# Patient Record
Sex: Female | Born: 1935 | Race: White | Hispanic: No | Marital: Married | Smoking: Never smoker
Health system: Southern US, Community
[De-identification: ages and names within clinical notes are randomized; demographics above are authoritative.]

## PROBLEM LIST (undated history)

## (undated) DIAGNOSIS — E785 Hyperlipidemia, unspecified: Secondary | ICD-10-CM

## (undated) DIAGNOSIS — Z972 Presence of dental prosthetic device (complete) (partial): Secondary | ICD-10-CM

## (undated) DIAGNOSIS — I2510 Atherosclerotic heart disease of native coronary artery without angina pectoris: Secondary | ICD-10-CM

## (undated) DIAGNOSIS — K219 Gastro-esophageal reflux disease without esophagitis: Secondary | ICD-10-CM

## (undated) DIAGNOSIS — I209 Angina pectoris, unspecified: Secondary | ICD-10-CM

## (undated) DIAGNOSIS — I340 Nonrheumatic mitral (valve) insufficiency: Secondary | ICD-10-CM

## (undated) DIAGNOSIS — N1831 Chronic kidney disease, stage 3a: Secondary | ICD-10-CM

## (undated) DIAGNOSIS — I071 Rheumatic tricuspid insufficiency: Secondary | ICD-10-CM

## (undated) DIAGNOSIS — J189 Pneumonia, unspecified organism: Secondary | ICD-10-CM

## (undated) DIAGNOSIS — I700 Atherosclerosis of aorta: Secondary | ICD-10-CM

## (undated) DIAGNOSIS — Z86711 Personal history of pulmonary embolism: Secondary | ICD-10-CM

## (undated) DIAGNOSIS — Z974 Presence of external hearing-aid: Secondary | ICD-10-CM

## (undated) DIAGNOSIS — M1990 Unspecified osteoarthritis, unspecified site: Secondary | ICD-10-CM

## (undated) DIAGNOSIS — I10 Essential (primary) hypertension: Secondary | ICD-10-CM

## (undated) DIAGNOSIS — R011 Cardiac murmur, unspecified: Secondary | ICD-10-CM

## (undated) HISTORY — DX: Unspecified osteoarthritis, unspecified site: M19.90

## (undated) HISTORY — PX: COLONOSCOPY: SHX174

## (undated) HISTORY — PX: MENISCUS REPAIR: SHX5179

## (undated) HISTORY — PX: APPENDECTOMY: SHX54

## (undated) HISTORY — DX: Essential (primary) hypertension: I10

## (undated) HISTORY — PX: ESOPHAGOGASTRODUODENOSCOPY: SHX1529

## (undated) HISTORY — PX: TONSILLECTOMY: SUR1361

## (undated) HISTORY — PX: EYE SURGERY: SHX253

## (undated) HISTORY — DX: Hyperlipidemia, unspecified: E78.5

## (undated) HISTORY — PX: BACK SURGERY: SHX140

## (undated) HISTORY — PX: ABDOMINAL HYSTERECTOMY: SHX81

---

## 1968-10-24 HISTORY — PX: BREAST CYST ASPIRATION: SHX578

## 1971-10-25 HISTORY — PX: BREAST BIOPSY: SHX20

## 1998-07-16 ENCOUNTER — Ambulatory Visit (HOSPITAL_COMMUNITY): Admission: RE | Admit: 1998-07-16 | Discharge: 1998-07-16 | Payer: Self-pay | Admitting: Obstetrics & Gynecology

## 1999-03-26 ENCOUNTER — Encounter: Payer: Self-pay | Admitting: Gastroenterology

## 1999-03-26 ENCOUNTER — Ambulatory Visit (HOSPITAL_COMMUNITY): Admission: RE | Admit: 1999-03-26 | Discharge: 1999-03-26 | Payer: Self-pay | Admitting: Gastroenterology

## 1999-05-28 ENCOUNTER — Ambulatory Visit (HOSPITAL_COMMUNITY): Admission: RE | Admit: 1999-05-28 | Discharge: 1999-05-28 | Payer: Self-pay | Admitting: Gastroenterology

## 2001-06-18 ENCOUNTER — Ambulatory Visit (HOSPITAL_COMMUNITY): Admission: RE | Admit: 2001-06-18 | Discharge: 2001-06-18 | Payer: Self-pay | Admitting: Gastroenterology

## 2003-03-03 ENCOUNTER — Ambulatory Visit (HOSPITAL_COMMUNITY): Admission: RE | Admit: 2003-03-03 | Discharge: 2003-03-03 | Payer: Self-pay | Admitting: Gastroenterology

## 2005-04-04 ENCOUNTER — Ambulatory Visit: Payer: Self-pay | Admitting: Internal Medicine

## 2005-04-21 ENCOUNTER — Ambulatory Visit (HOSPITAL_COMMUNITY): Admission: RE | Admit: 2005-04-21 | Discharge: 2005-04-21 | Payer: Self-pay | Admitting: Gastroenterology

## 2005-11-01 ENCOUNTER — Ambulatory Visit: Payer: Self-pay

## 2005-11-25 ENCOUNTER — Ambulatory Visit: Payer: Self-pay | Admitting: Orthopaedic Surgery

## 2006-05-02 ENCOUNTER — Ambulatory Visit: Payer: Self-pay | Admitting: Internal Medicine

## 2007-02-09 ENCOUNTER — Ambulatory Visit: Payer: Self-pay

## 2007-03-12 ENCOUNTER — Ambulatory Visit: Payer: Self-pay | Admitting: Internal Medicine

## 2007-03-13 ENCOUNTER — Ambulatory Visit: Payer: Self-pay | Admitting: Internal Medicine

## 2007-05-04 ENCOUNTER — Ambulatory Visit: Payer: Self-pay | Admitting: Internal Medicine

## 2007-05-08 ENCOUNTER — Ambulatory Visit: Payer: Self-pay | Admitting: Internal Medicine

## 2007-11-06 ENCOUNTER — Other Ambulatory Visit: Payer: Self-pay

## 2007-11-06 ENCOUNTER — Emergency Department: Payer: Self-pay | Admitting: Internal Medicine

## 2008-05-09 ENCOUNTER — Ambulatory Visit: Payer: Self-pay | Admitting: Internal Medicine

## 2009-01-28 ENCOUNTER — Ambulatory Visit: Payer: Self-pay | Admitting: Internal Medicine

## 2009-05-11 ENCOUNTER — Ambulatory Visit: Payer: Self-pay | Admitting: Internal Medicine

## 2009-06-05 ENCOUNTER — Ambulatory Visit: Payer: Self-pay | Admitting: Otolaryngology

## 2010-04-09 ENCOUNTER — Ambulatory Visit: Payer: Self-pay | Admitting: Internal Medicine

## 2010-05-12 ENCOUNTER — Ambulatory Visit: Payer: Self-pay | Admitting: Internal Medicine

## 2010-10-10 ENCOUNTER — Emergency Department: Payer: Self-pay | Admitting: Emergency Medicine

## 2011-05-11 ENCOUNTER — Other Ambulatory Visit: Payer: Self-pay | Admitting: Gastroenterology

## 2011-05-23 ENCOUNTER — Ambulatory Visit: Payer: Self-pay | Admitting: Internal Medicine

## 2011-07-14 ENCOUNTER — Ambulatory Visit: Payer: Self-pay | Admitting: Ophthalmology

## 2011-07-25 ENCOUNTER — Ambulatory Visit: Payer: Self-pay | Admitting: Ophthalmology

## 2011-08-12 ENCOUNTER — Ambulatory Visit: Payer: Self-pay | Admitting: Ophthalmology

## 2011-08-29 ENCOUNTER — Ambulatory Visit: Payer: Self-pay | Admitting: Ophthalmology

## 2012-05-08 ENCOUNTER — Ambulatory Visit: Payer: Self-pay | Admitting: Internal Medicine

## 2012-05-24 ENCOUNTER — Ambulatory Visit: Payer: Self-pay | Admitting: Internal Medicine

## 2013-06-06 ENCOUNTER — Ambulatory Visit: Payer: Self-pay | Admitting: Internal Medicine

## 2013-07-10 ENCOUNTER — Other Ambulatory Visit: Payer: Self-pay | Admitting: Gastroenterology

## 2014-07-16 ENCOUNTER — Ambulatory Visit: Payer: Self-pay | Admitting: Internal Medicine

## 2015-07-01 ENCOUNTER — Other Ambulatory Visit: Payer: Self-pay | Admitting: Internal Medicine

## 2015-07-01 DIAGNOSIS — Z1231 Encounter for screening mammogram for malignant neoplasm of breast: Secondary | ICD-10-CM

## 2015-07-20 ENCOUNTER — Ambulatory Visit
Admission: RE | Admit: 2015-07-20 | Discharge: 2015-07-20 | Disposition: A | Discharge status: Home / Self Care | Payer: Medicare Other | Source: Ambulatory Visit | Attending: Internal Medicine | Admitting: Internal Medicine

## 2015-07-20 DIAGNOSIS — Z1231 Encounter for screening mammogram for malignant neoplasm of breast: Secondary | ICD-10-CM | POA: Insufficient documentation

## 2015-07-27 ENCOUNTER — Other Ambulatory Visit: Payer: Self-pay | Admitting: Gastroenterology

## 2015-11-09 DIAGNOSIS — G5603 Carpal tunnel syndrome, bilateral upper limbs: Secondary | ICD-10-CM | POA: Diagnosis not present

## 2015-12-30 DIAGNOSIS — E7800 Pure hypercholesterolemia, unspecified: Secondary | ICD-10-CM | POA: Diagnosis not present

## 2015-12-30 DIAGNOSIS — Z79899 Other long term (current) drug therapy: Secondary | ICD-10-CM | POA: Diagnosis not present

## 2016-01-06 DIAGNOSIS — M1990 Unspecified osteoarthritis, unspecified site: Secondary | ICD-10-CM | POA: Diagnosis not present

## 2016-01-06 DIAGNOSIS — I10 Essential (primary) hypertension: Secondary | ICD-10-CM | POA: Diagnosis not present

## 2016-01-06 DIAGNOSIS — Z79899 Other long term (current) drug therapy: Secondary | ICD-10-CM | POA: Diagnosis not present

## 2016-01-06 DIAGNOSIS — E7800 Pure hypercholesterolemia, unspecified: Secondary | ICD-10-CM | POA: Diagnosis not present

## 2016-01-06 DIAGNOSIS — Z23 Encounter for immunization: Secondary | ICD-10-CM | POA: Diagnosis not present

## 2016-07-01 DIAGNOSIS — I10 Essential (primary) hypertension: Secondary | ICD-10-CM | POA: Diagnosis not present

## 2016-07-01 DIAGNOSIS — Z79899 Other long term (current) drug therapy: Secondary | ICD-10-CM | POA: Diagnosis not present

## 2016-07-01 DIAGNOSIS — E7800 Pure hypercholesterolemia, unspecified: Secondary | ICD-10-CM | POA: Diagnosis not present

## 2016-07-08 DIAGNOSIS — Z0000 Encounter for general adult medical examination without abnormal findings: Secondary | ICD-10-CM | POA: Diagnosis not present

## 2016-07-08 DIAGNOSIS — R51 Headache: Secondary | ICD-10-CM | POA: Diagnosis not present

## 2016-07-08 DIAGNOSIS — R0789 Other chest pain: Secondary | ICD-10-CM | POA: Diagnosis not present

## 2016-07-08 DIAGNOSIS — R011 Cardiac murmur, unspecified: Secondary | ICD-10-CM | POA: Diagnosis not present

## 2016-07-08 DIAGNOSIS — I10 Essential (primary) hypertension: Secondary | ICD-10-CM | POA: Diagnosis not present

## 2016-07-08 DIAGNOSIS — E7800 Pure hypercholesterolemia, unspecified: Secondary | ICD-10-CM | POA: Diagnosis not present

## 2016-07-08 DIAGNOSIS — R079 Chest pain, unspecified: Secondary | ICD-10-CM | POA: Diagnosis not present

## 2016-07-08 DIAGNOSIS — F5104 Psychophysiologic insomnia: Secondary | ICD-10-CM | POA: Diagnosis not present

## 2016-07-08 DIAGNOSIS — Z79899 Other long term (current) drug therapy: Secondary | ICD-10-CM | POA: Diagnosis not present

## 2016-07-08 DIAGNOSIS — K219 Gastro-esophageal reflux disease without esophagitis: Secondary | ICD-10-CM | POA: Diagnosis not present

## 2016-07-08 DIAGNOSIS — Z1239 Encounter for other screening for malignant neoplasm of breast: Secondary | ICD-10-CM | POA: Diagnosis not present

## 2016-07-15 DIAGNOSIS — I208 Other forms of angina pectoris: Secondary | ICD-10-CM | POA: Diagnosis not present

## 2016-07-15 DIAGNOSIS — Z23 Encounter for immunization: Secondary | ICD-10-CM | POA: Diagnosis not present

## 2016-07-15 DIAGNOSIS — R0789 Other chest pain: Secondary | ICD-10-CM | POA: Diagnosis not present

## 2016-07-15 DIAGNOSIS — I10 Essential (primary) hypertension: Secondary | ICD-10-CM | POA: Diagnosis not present

## 2016-07-15 DIAGNOSIS — E7800 Pure hypercholesterolemia, unspecified: Secondary | ICD-10-CM | POA: Diagnosis not present

## 2016-07-18 ENCOUNTER — Other Ambulatory Visit: Payer: Self-pay | Admitting: Internal Medicine

## 2016-07-18 DIAGNOSIS — Z1231 Encounter for screening mammogram for malignant neoplasm of breast: Secondary | ICD-10-CM

## 2016-08-09 ENCOUNTER — Ambulatory Visit: Payer: Medicare Other

## 2016-08-10 DIAGNOSIS — I208 Other forms of angina pectoris: Secondary | ICD-10-CM | POA: Diagnosis not present

## 2016-08-15 DIAGNOSIS — K219 Gastro-esophageal reflux disease without esophagitis: Secondary | ICD-10-CM | POA: Diagnosis not present

## 2016-08-15 DIAGNOSIS — E7800 Pure hypercholesterolemia, unspecified: Secondary | ICD-10-CM | POA: Diagnosis not present

## 2016-08-15 DIAGNOSIS — I10 Essential (primary) hypertension: Secondary | ICD-10-CM | POA: Diagnosis not present

## 2016-08-15 DIAGNOSIS — I208 Other forms of angina pectoris: Secondary | ICD-10-CM | POA: Diagnosis not present

## 2016-08-19 ENCOUNTER — Ambulatory Visit
Admission: RE | Admit: 2016-08-19 | Discharge: 2016-08-19 | Disposition: A | Discharge status: Home / Self Care | Payer: PPO | Source: Ambulatory Visit | Attending: Internal Medicine | Admitting: Internal Medicine

## 2016-08-19 DIAGNOSIS — Z1231 Encounter for screening mammogram for malignant neoplasm of breast: Secondary | ICD-10-CM | POA: Insufficient documentation

## 2016-10-23 DIAGNOSIS — J0100 Acute maxillary sinusitis, unspecified: Secondary | ICD-10-CM | POA: Diagnosis not present

## 2016-10-28 DIAGNOSIS — R062 Wheezing: Secondary | ICD-10-CM | POA: Diagnosis not present

## 2016-10-28 DIAGNOSIS — R0989 Other specified symptoms and signs involving the circulatory and respiratory systems: Secondary | ICD-10-CM | POA: Diagnosis not present

## 2016-10-28 DIAGNOSIS — R05 Cough: Secondary | ICD-10-CM | POA: Diagnosis not present

## 2016-10-28 DIAGNOSIS — J4520 Mild intermittent asthma, uncomplicated: Secondary | ICD-10-CM | POA: Diagnosis not present

## 2016-12-06 DIAGNOSIS — M5440 Lumbago with sciatica, unspecified side: Secondary | ICD-10-CM | POA: Diagnosis not present

## 2016-12-06 DIAGNOSIS — M65311 Trigger thumb, right thumb: Secondary | ICD-10-CM | POA: Diagnosis not present

## 2016-12-06 DIAGNOSIS — M79644 Pain in right finger(s): Secondary | ICD-10-CM | POA: Diagnosis not present

## 2016-12-08 DIAGNOSIS — E7800 Pure hypercholesterolemia, unspecified: Secondary | ICD-10-CM | POA: Diagnosis not present

## 2016-12-08 DIAGNOSIS — R0602 Shortness of breath: Secondary | ICD-10-CM | POA: Insufficient documentation

## 2016-12-08 DIAGNOSIS — R0782 Intercostal pain: Secondary | ICD-10-CM | POA: Diagnosis not present

## 2016-12-08 DIAGNOSIS — I10 Essential (primary) hypertension: Secondary | ICD-10-CM | POA: Diagnosis not present

## 2016-12-09 DIAGNOSIS — Z961 Presence of intraocular lens: Secondary | ICD-10-CM | POA: Diagnosis not present

## 2016-12-29 DIAGNOSIS — I10 Essential (primary) hypertension: Secondary | ICD-10-CM | POA: Diagnosis not present

## 2016-12-29 DIAGNOSIS — Z79899 Other long term (current) drug therapy: Secondary | ICD-10-CM | POA: Diagnosis not present

## 2016-12-29 DIAGNOSIS — E7800 Pure hypercholesterolemia, unspecified: Secondary | ICD-10-CM | POA: Diagnosis not present

## 2017-01-05 DIAGNOSIS — M25562 Pain in left knee: Secondary | ICD-10-CM | POA: Diagnosis not present

## 2017-01-05 DIAGNOSIS — E7800 Pure hypercholesterolemia, unspecified: Secondary | ICD-10-CM | POA: Diagnosis not present

## 2017-01-05 DIAGNOSIS — M25462 Effusion, left knee: Secondary | ICD-10-CM | POA: Diagnosis not present

## 2017-01-05 DIAGNOSIS — Z79899 Other long term (current) drug therapy: Secondary | ICD-10-CM | POA: Diagnosis not present

## 2017-01-05 DIAGNOSIS — Z0000 Encounter for general adult medical examination without abnormal findings: Secondary | ICD-10-CM | POA: Diagnosis not present

## 2017-01-05 DIAGNOSIS — I10 Essential (primary) hypertension: Secondary | ICD-10-CM | POA: Diagnosis not present

## 2017-01-12 DIAGNOSIS — J4520 Mild intermittent asthma, uncomplicated: Secondary | ICD-10-CM | POA: Diagnosis not present

## 2017-04-10 DIAGNOSIS — M1712 Unilateral primary osteoarthritis, left knee: Secondary | ICD-10-CM | POA: Diagnosis not present

## 2017-05-19 DIAGNOSIS — M1712 Unilateral primary osteoarthritis, left knee: Secondary | ICD-10-CM | POA: Insufficient documentation

## 2017-05-19 DIAGNOSIS — M5442 Lumbago with sciatica, left side: Secondary | ICD-10-CM | POA: Diagnosis not present

## 2017-05-19 DIAGNOSIS — M810 Age-related osteoporosis without current pathological fracture: Secondary | ICD-10-CM | POA: Diagnosis not present

## 2017-05-22 ENCOUNTER — Other Ambulatory Visit: Payer: Self-pay | Admitting: Unknown Physician Specialty

## 2017-05-22 DIAGNOSIS — M5442 Lumbago with sciatica, left side: Secondary | ICD-10-CM

## 2017-05-27 ENCOUNTER — Ambulatory Visit
Admission: RE | Admit: 2017-05-27 | Discharge: 2017-05-27 | Disposition: A | Discharge status: Home / Self Care | Payer: PPO | Source: Ambulatory Visit | Attending: Unknown Physician Specialty | Admitting: Unknown Physician Specialty

## 2017-05-27 DIAGNOSIS — M5442 Lumbago with sciatica, left side: Secondary | ICD-10-CM | POA: Diagnosis present

## 2017-05-27 DIAGNOSIS — M48061 Spinal stenosis, lumbar region without neurogenic claudication: Secondary | ICD-10-CM | POA: Diagnosis not present

## 2017-05-27 DIAGNOSIS — M5136 Other intervertebral disc degeneration, lumbar region: Secondary | ICD-10-CM | POA: Insufficient documentation

## 2017-05-27 DIAGNOSIS — M5126 Other intervertebral disc displacement, lumbar region: Secondary | ICD-10-CM | POA: Diagnosis not present

## 2017-05-27 DIAGNOSIS — M4316 Spondylolisthesis, lumbar region: Secondary | ICD-10-CM | POA: Insufficient documentation

## 2017-06-10 DIAGNOSIS — N3090 Cystitis, unspecified without hematuria: Secondary | ICD-10-CM | POA: Diagnosis not present

## 2017-06-10 DIAGNOSIS — R300 Dysuria: Secondary | ICD-10-CM | POA: Diagnosis not present

## 2017-06-23 DIAGNOSIS — M1712 Unilateral primary osteoarthritis, left knee: Secondary | ICD-10-CM | POA: Diagnosis not present

## 2017-06-30 DIAGNOSIS — M1712 Unilateral primary osteoarthritis, left knee: Secondary | ICD-10-CM | POA: Diagnosis not present

## 2017-07-05 DIAGNOSIS — M1712 Unilateral primary osteoarthritis, left knee: Secondary | ICD-10-CM | POA: Diagnosis not present

## 2017-07-06 DIAGNOSIS — E7800 Pure hypercholesterolemia, unspecified: Secondary | ICD-10-CM | POA: Diagnosis not present

## 2017-07-06 DIAGNOSIS — Z79899 Other long term (current) drug therapy: Secondary | ICD-10-CM | POA: Diagnosis not present

## 2017-07-06 DIAGNOSIS — I10 Essential (primary) hypertension: Secondary | ICD-10-CM | POA: Diagnosis not present

## 2017-07-13 ENCOUNTER — Other Ambulatory Visit: Payer: Self-pay | Admitting: Internal Medicine

## 2017-07-13 DIAGNOSIS — Z0000 Encounter for general adult medical examination without abnormal findings: Secondary | ICD-10-CM | POA: Diagnosis not present

## 2017-07-13 DIAGNOSIS — Z1231 Encounter for screening mammogram for malignant neoplasm of breast: Secondary | ICD-10-CM | POA: Diagnosis not present

## 2017-07-13 DIAGNOSIS — E7800 Pure hypercholesterolemia, unspecified: Secondary | ICD-10-CM | POA: Diagnosis not present

## 2017-07-13 DIAGNOSIS — M1990 Unspecified osteoarthritis, unspecified site: Secondary | ICD-10-CM | POA: Diagnosis not present

## 2017-07-13 DIAGNOSIS — Z13820 Encounter for screening for osteoporosis: Secondary | ICD-10-CM | POA: Diagnosis not present

## 2017-07-13 DIAGNOSIS — F5104 Psychophysiologic insomnia: Secondary | ICD-10-CM | POA: Diagnosis not present

## 2017-07-13 DIAGNOSIS — I10 Essential (primary) hypertension: Secondary | ICD-10-CM | POA: Diagnosis not present

## 2017-07-13 DIAGNOSIS — R05 Cough: Secondary | ICD-10-CM | POA: Diagnosis not present

## 2017-07-13 DIAGNOSIS — R490 Dysphonia: Secondary | ICD-10-CM | POA: Diagnosis not present

## 2017-07-14 DIAGNOSIS — M48062 Spinal stenosis, lumbar region with neurogenic claudication: Secondary | ICD-10-CM | POA: Diagnosis not present

## 2017-07-14 DIAGNOSIS — M5416 Radiculopathy, lumbar region: Secondary | ICD-10-CM | POA: Diagnosis not present

## 2017-07-14 DIAGNOSIS — M5136 Other intervertebral disc degeneration, lumbar region: Secondary | ICD-10-CM | POA: Diagnosis not present

## 2017-07-17 DIAGNOSIS — R490 Dysphonia: Secondary | ICD-10-CM | POA: Diagnosis not present

## 2017-07-25 DIAGNOSIS — M8588 Other specified disorders of bone density and structure, other site: Secondary | ICD-10-CM | POA: Diagnosis not present

## 2017-08-11 DIAGNOSIS — M5136 Other intervertebral disc degeneration, lumbar region: Secondary | ICD-10-CM | POA: Diagnosis not present

## 2017-08-11 DIAGNOSIS — M48062 Spinal stenosis, lumbar region with neurogenic claudication: Secondary | ICD-10-CM | POA: Diagnosis not present

## 2017-08-11 DIAGNOSIS — M5416 Radiculopathy, lumbar region: Secondary | ICD-10-CM | POA: Diagnosis not present

## 2017-08-15 DIAGNOSIS — M7551 Bursitis of right shoulder: Secondary | ICD-10-CM | POA: Diagnosis not present

## 2017-08-15 DIAGNOSIS — M25511 Pain in right shoulder: Secondary | ICD-10-CM | POA: Diagnosis not present

## 2017-08-21 ENCOUNTER — Ambulatory Visit
Admission: RE | Admit: 2017-08-21 | Discharge: 2017-08-21 | Disposition: A | Discharge status: Home / Self Care | Payer: PPO | Source: Ambulatory Visit | Attending: Internal Medicine | Admitting: Internal Medicine

## 2017-08-21 DIAGNOSIS — Z1231 Encounter for screening mammogram for malignant neoplasm of breast: Secondary | ICD-10-CM | POA: Diagnosis not present

## 2017-09-11 DIAGNOSIS — H0011 Chalazion right upper eyelid: Secondary | ICD-10-CM | POA: Diagnosis not present

## 2017-11-01 DIAGNOSIS — H0013 Chalazion right eye, unspecified eyelid: Secondary | ICD-10-CM | POA: Diagnosis not present

## 2017-11-21 DIAGNOSIS — J019 Acute sinusitis, unspecified: Secondary | ICD-10-CM | POA: Diagnosis not present

## 2017-12-29 DIAGNOSIS — H26492 Other secondary cataract, left eye: Secondary | ICD-10-CM | POA: Diagnosis not present

## 2018-01-09 DIAGNOSIS — M5136 Other intervertebral disc degeneration, lumbar region: Secondary | ICD-10-CM | POA: Diagnosis not present

## 2018-01-09 DIAGNOSIS — M5416 Radiculopathy, lumbar region: Secondary | ICD-10-CM | POA: Diagnosis not present

## 2018-01-11 DIAGNOSIS — E7800 Pure hypercholesterolemia, unspecified: Secondary | ICD-10-CM | POA: Diagnosis not present

## 2018-01-11 DIAGNOSIS — Z0000 Encounter for general adult medical examination without abnormal findings: Secondary | ICD-10-CM | POA: Diagnosis not present

## 2018-01-11 DIAGNOSIS — R011 Cardiac murmur, unspecified: Secondary | ICD-10-CM | POA: Diagnosis not present

## 2018-01-11 DIAGNOSIS — I10 Essential (primary) hypertension: Secondary | ICD-10-CM | POA: Diagnosis not present

## 2018-01-11 DIAGNOSIS — Z79899 Other long term (current) drug therapy: Secondary | ICD-10-CM | POA: Diagnosis not present

## 2018-01-24 DIAGNOSIS — R011 Cardiac murmur, unspecified: Secondary | ICD-10-CM | POA: Diagnosis not present

## 2018-01-25 DIAGNOSIS — Z79899 Other long term (current) drug therapy: Secondary | ICD-10-CM | POA: Diagnosis not present

## 2018-01-25 DIAGNOSIS — R8290 Unspecified abnormal findings in urine: Secondary | ICD-10-CM | POA: Diagnosis not present

## 2018-01-25 DIAGNOSIS — E7800 Pure hypercholesterolemia, unspecified: Secondary | ICD-10-CM | POA: Diagnosis not present

## 2018-01-25 DIAGNOSIS — I10 Essential (primary) hypertension: Secondary | ICD-10-CM | POA: Diagnosis not present

## 2018-02-07 DIAGNOSIS — M5416 Radiculopathy, lumbar region: Secondary | ICD-10-CM | POA: Diagnosis not present

## 2018-02-07 DIAGNOSIS — M5136 Other intervertebral disc degeneration, lumbar region: Secondary | ICD-10-CM | POA: Diagnosis not present

## 2018-04-03 DIAGNOSIS — M1712 Unilateral primary osteoarthritis, left knee: Secondary | ICD-10-CM | POA: Diagnosis not present

## 2018-04-10 DIAGNOSIS — M5416 Radiculopathy, lumbar region: Secondary | ICD-10-CM | POA: Diagnosis not present

## 2018-04-10 DIAGNOSIS — M5136 Other intervertebral disc degeneration, lumbar region: Secondary | ICD-10-CM | POA: Diagnosis not present

## 2018-04-10 DIAGNOSIS — M48062 Spinal stenosis, lumbar region with neurogenic claudication: Secondary | ICD-10-CM | POA: Diagnosis not present

## 2018-04-24 DIAGNOSIS — M1712 Unilateral primary osteoarthritis, left knee: Secondary | ICD-10-CM | POA: Diagnosis not present

## 2018-05-01 DIAGNOSIS — M1712 Unilateral primary osteoarthritis, left knee: Secondary | ICD-10-CM | POA: Diagnosis not present

## 2018-05-08 DIAGNOSIS — M1712 Unilateral primary osteoarthritis, left knee: Secondary | ICD-10-CM | POA: Diagnosis not present

## 2018-05-30 ENCOUNTER — Other Ambulatory Visit: Payer: Self-pay | Admitting: Family Medicine

## 2018-05-30 ENCOUNTER — Ambulatory Visit
Admission: RE | Admit: 2018-05-30 | Discharge: 2018-05-30 | Disposition: A | Discharge status: Home / Self Care | Payer: PPO | Source: Ambulatory Visit | Attending: Family Medicine | Admitting: Family Medicine

## 2018-05-30 ENCOUNTER — Other Ambulatory Visit
Admission: RE | Admit: 2018-05-30 | Discharge: 2018-05-30 | Disposition: A | Discharge status: Home / Self Care | Payer: PPO | Source: Ambulatory Visit | Attending: Family Medicine | Admitting: Family Medicine

## 2018-05-30 DIAGNOSIS — I700 Atherosclerosis of aorta: Secondary | ICD-10-CM | POA: Diagnosis not present

## 2018-05-30 DIAGNOSIS — R0781 Pleurodynia: Secondary | ICD-10-CM | POA: Insufficient documentation

## 2018-05-30 DIAGNOSIS — R079 Chest pain, unspecified: Secondary | ICD-10-CM | POA: Diagnosis not present

## 2018-05-30 DIAGNOSIS — I10 Essential (primary) hypertension: Secondary | ICD-10-CM | POA: Diagnosis not present

## 2018-05-30 DIAGNOSIS — I2510 Atherosclerotic heart disease of native coronary artery without angina pectoris: Secondary | ICD-10-CM | POA: Diagnosis not present

## 2018-05-30 DIAGNOSIS — R7989 Other specified abnormal findings of blood chemistry: Secondary | ICD-10-CM | POA: Insufficient documentation

## 2018-05-30 LAB — FIBRIN DERIVATIVES D-DIMER (ARMC ONLY): FIBRIN DERIVATIVES D-DIMER (ARMC): 923.37 ng{FEU}/mL — AB (ref 0.00–499.00)

## 2018-05-30 LAB — POCT I-STAT CREATININE: Creatinine, Ser: 0.9 mg/dL (ref 0.44–1.00)

## 2018-05-30 MED ORDER — IOPAMIDOL (ISOVUE-370) INJECTION 76%
75.0000 mL | Freq: Once | INTRAVENOUS | Status: AC | PRN
  Administered 2018-05-30: 75 mL via INTRAVENOUS

## 2018-07-16 ENCOUNTER — Other Ambulatory Visit: Payer: Self-pay | Admitting: Internal Medicine

## 2018-07-16 DIAGNOSIS — Z1231 Encounter for screening mammogram for malignant neoplasm of breast: Secondary | ICD-10-CM

## 2018-07-17 DIAGNOSIS — S61412A Laceration without foreign body of left hand, initial encounter: Secondary | ICD-10-CM | POA: Diagnosis not present

## 2018-07-20 DIAGNOSIS — R0602 Shortness of breath: Secondary | ICD-10-CM | POA: Diagnosis not present

## 2018-07-20 DIAGNOSIS — E7800 Pure hypercholesterolemia, unspecified: Secondary | ICD-10-CM | POA: Diagnosis not present

## 2018-07-20 DIAGNOSIS — I10 Essential (primary) hypertension: Secondary | ICD-10-CM | POA: Diagnosis not present

## 2018-07-20 DIAGNOSIS — Z79899 Other long term (current) drug therapy: Secondary | ICD-10-CM | POA: Diagnosis not present

## 2018-07-20 DIAGNOSIS — Z0000 Encounter for general adult medical examination without abnormal findings: Secondary | ICD-10-CM | POA: Diagnosis not present

## 2018-07-20 DIAGNOSIS — I2584 Coronary atherosclerosis due to calcified coronary lesion: Secondary | ICD-10-CM | POA: Insufficient documentation

## 2018-07-20 DIAGNOSIS — I2510 Atherosclerotic heart disease of native coronary artery without angina pectoris: Secondary | ICD-10-CM | POA: Insufficient documentation

## 2018-07-20 DIAGNOSIS — R079 Chest pain, unspecified: Secondary | ICD-10-CM | POA: Diagnosis not present

## 2018-07-20 DIAGNOSIS — F5104 Psychophysiologic insomnia: Secondary | ICD-10-CM | POA: Diagnosis not present

## 2018-07-20 DIAGNOSIS — Z1239 Encounter for other screening for malignant neoplasm of breast: Secondary | ICD-10-CM | POA: Diagnosis not present

## 2018-07-20 DIAGNOSIS — Z1211 Encounter for screening for malignant neoplasm of colon: Secondary | ICD-10-CM | POA: Diagnosis not present

## 2018-07-24 DIAGNOSIS — Z1211 Encounter for screening for malignant neoplasm of colon: Secondary | ICD-10-CM | POA: Diagnosis not present

## 2018-07-25 DIAGNOSIS — I10 Essential (primary) hypertension: Secondary | ICD-10-CM | POA: Diagnosis not present

## 2018-07-25 DIAGNOSIS — Z79899 Other long term (current) drug therapy: Secondary | ICD-10-CM | POA: Diagnosis not present

## 2018-07-25 DIAGNOSIS — E7800 Pure hypercholesterolemia, unspecified: Secondary | ICD-10-CM | POA: Diagnosis not present

## 2018-07-27 DIAGNOSIS — I2510 Atherosclerotic heart disease of native coronary artery without angina pectoris: Secondary | ICD-10-CM | POA: Insufficient documentation

## 2018-07-27 DIAGNOSIS — I25118 Atherosclerotic heart disease of native coronary artery with other forms of angina pectoris: Secondary | ICD-10-CM | POA: Diagnosis not present

## 2018-07-27 DIAGNOSIS — I10 Essential (primary) hypertension: Secondary | ICD-10-CM | POA: Diagnosis not present

## 2018-07-27 DIAGNOSIS — Z23 Encounter for immunization: Secondary | ICD-10-CM | POA: Diagnosis not present

## 2018-07-27 DIAGNOSIS — R0602 Shortness of breath: Secondary | ICD-10-CM | POA: Diagnosis not present

## 2018-07-27 DIAGNOSIS — E7800 Pure hypercholesterolemia, unspecified: Secondary | ICD-10-CM | POA: Diagnosis not present

## 2018-07-27 DIAGNOSIS — R0782 Intercostal pain: Secondary | ICD-10-CM | POA: Diagnosis not present

## 2018-07-27 DIAGNOSIS — I700 Atherosclerosis of aorta: Secondary | ICD-10-CM | POA: Insufficient documentation

## 2018-08-02 DIAGNOSIS — I25118 Atherosclerotic heart disease of native coronary artery with other forms of angina pectoris: Secondary | ICD-10-CM | POA: Diagnosis not present

## 2018-08-02 DIAGNOSIS — R195 Other fecal abnormalities: Secondary | ICD-10-CM | POA: Diagnosis not present

## 2018-08-10 DIAGNOSIS — E7800 Pure hypercholesterolemia, unspecified: Secondary | ICD-10-CM | POA: Diagnosis not present

## 2018-08-10 DIAGNOSIS — I10 Essential (primary) hypertension: Secondary | ICD-10-CM | POA: Diagnosis not present

## 2018-08-10 DIAGNOSIS — R0789 Other chest pain: Secondary | ICD-10-CM | POA: Diagnosis not present

## 2018-08-10 DIAGNOSIS — I2510 Atherosclerotic heart disease of native coronary artery without angina pectoris: Secondary | ICD-10-CM | POA: Diagnosis not present

## 2018-08-22 ENCOUNTER — Ambulatory Visit
Admission: RE | Admit: 2018-08-22 | Discharge: 2018-08-22 | Disposition: A | Discharge status: Home / Self Care | Payer: PPO | Source: Ambulatory Visit | Attending: Internal Medicine | Admitting: Internal Medicine

## 2018-08-22 DIAGNOSIS — Z1231 Encounter for screening mammogram for malignant neoplasm of breast: Secondary | ICD-10-CM | POA: Diagnosis not present

## 2018-08-27 DIAGNOSIS — R21 Rash and other nonspecific skin eruption: Secondary | ICD-10-CM | POA: Diagnosis not present

## 2018-09-17 DIAGNOSIS — M48061 Spinal stenosis, lumbar region without neurogenic claudication: Secondary | ICD-10-CM | POA: Diagnosis not present

## 2018-09-17 DIAGNOSIS — M5442 Lumbago with sciatica, left side: Secondary | ICD-10-CM | POA: Diagnosis not present

## 2018-09-17 DIAGNOSIS — G8929 Other chronic pain: Secondary | ICD-10-CM | POA: Diagnosis not present

## 2018-10-11 DIAGNOSIS — R195 Other fecal abnormalities: Secondary | ICD-10-CM | POA: Diagnosis not present

## 2018-10-11 DIAGNOSIS — Z800 Family history of malignant neoplasm of digestive organs: Secondary | ICD-10-CM | POA: Diagnosis not present

## 2018-10-12 DIAGNOSIS — M25552 Pain in left hip: Secondary | ICD-10-CM | POA: Diagnosis not present

## 2018-10-12 DIAGNOSIS — M5136 Other intervertebral disc degeneration, lumbar region: Secondary | ICD-10-CM | POA: Diagnosis not present

## 2018-10-12 DIAGNOSIS — M48061 Spinal stenosis, lumbar region without neurogenic claudication: Secondary | ICD-10-CM | POA: Diagnosis not present

## 2018-10-12 DIAGNOSIS — Z8739 Personal history of other diseases of the musculoskeletal system and connective tissue: Secondary | ICD-10-CM | POA: Diagnosis not present

## 2018-11-02 DIAGNOSIS — M5136 Other intervertebral disc degeneration, lumbar region: Secondary | ICD-10-CM | POA: Diagnosis not present

## 2018-11-02 DIAGNOSIS — M48062 Spinal stenosis, lumbar region with neurogenic claudication: Secondary | ICD-10-CM | POA: Diagnosis not present

## 2018-11-02 DIAGNOSIS — M5416 Radiculopathy, lumbar region: Secondary | ICD-10-CM | POA: Diagnosis not present

## 2018-11-29 DIAGNOSIS — K648 Other hemorrhoids: Secondary | ICD-10-CM | POA: Diagnosis not present

## 2018-11-29 DIAGNOSIS — D125 Benign neoplasm of sigmoid colon: Secondary | ICD-10-CM | POA: Diagnosis not present

## 2018-11-29 DIAGNOSIS — R195 Other fecal abnormalities: Secondary | ICD-10-CM | POA: Diagnosis not present

## 2018-11-29 DIAGNOSIS — K5730 Diverticulosis of large intestine without perforation or abscess without bleeding: Secondary | ICD-10-CM | POA: Diagnosis not present

## 2018-11-29 DIAGNOSIS — Z800 Family history of malignant neoplasm of digestive organs: Secondary | ICD-10-CM | POA: Diagnosis not present

## 2018-11-29 DIAGNOSIS — D122 Benign neoplasm of ascending colon: Secondary | ICD-10-CM | POA: Diagnosis not present

## 2018-11-30 DIAGNOSIS — M5136 Other intervertebral disc degeneration, lumbar region: Secondary | ICD-10-CM | POA: Diagnosis not present

## 2018-11-30 DIAGNOSIS — M5416 Radiculopathy, lumbar region: Secondary | ICD-10-CM | POA: Diagnosis not present

## 2018-11-30 DIAGNOSIS — M48062 Spinal stenosis, lumbar region with neurogenic claudication: Secondary | ICD-10-CM | POA: Diagnosis not present

## 2018-12-04 DIAGNOSIS — D122 Benign neoplasm of ascending colon: Secondary | ICD-10-CM | POA: Diagnosis not present

## 2018-12-04 DIAGNOSIS — D125 Benign neoplasm of sigmoid colon: Secondary | ICD-10-CM | POA: Diagnosis not present

## 2018-12-21 DIAGNOSIS — M1712 Unilateral primary osteoarthritis, left knee: Secondary | ICD-10-CM | POA: Diagnosis not present

## 2019-01-18 DIAGNOSIS — K219 Gastro-esophageal reflux disease without esophagitis: Secondary | ICD-10-CM | POA: Diagnosis not present

## 2019-01-18 DIAGNOSIS — E7800 Pure hypercholesterolemia, unspecified: Secondary | ICD-10-CM | POA: Diagnosis not present

## 2019-01-18 DIAGNOSIS — I2510 Atherosclerotic heart disease of native coronary artery without angina pectoris: Secondary | ICD-10-CM | POA: Diagnosis not present

## 2019-01-18 DIAGNOSIS — Z79899 Other long term (current) drug therapy: Secondary | ICD-10-CM | POA: Diagnosis not present

## 2019-01-18 DIAGNOSIS — Z0000 Encounter for general adult medical examination without abnormal findings: Secondary | ICD-10-CM | POA: Diagnosis not present

## 2019-01-18 DIAGNOSIS — I10 Essential (primary) hypertension: Secondary | ICD-10-CM | POA: Diagnosis not present

## 2019-01-31 DIAGNOSIS — Z7901 Long term (current) use of anticoagulants: Secondary | ICD-10-CM | POA: Diagnosis not present

## 2019-01-31 DIAGNOSIS — Z79891 Long term (current) use of opiate analgesic: Secondary | ICD-10-CM | POA: Diagnosis not present

## 2019-01-31 DIAGNOSIS — Z96653 Presence of artificial knee joint, bilateral: Secondary | ICD-10-CM | POA: Diagnosis not present

## 2019-01-31 DIAGNOSIS — R350 Frequency of micturition: Secondary | ICD-10-CM | POA: Diagnosis not present

## 2019-01-31 DIAGNOSIS — I10 Essential (primary) hypertension: Secondary | ICD-10-CM | POA: Diagnosis not present

## 2019-01-31 DIAGNOSIS — Z9181 History of falling: Secondary | ICD-10-CM | POA: Diagnosis not present

## 2019-01-31 DIAGNOSIS — M1990 Unspecified osteoarthritis, unspecified site: Secondary | ICD-10-CM | POA: Diagnosis not present

## 2019-01-31 DIAGNOSIS — I2510 Atherosclerotic heart disease of native coronary artery without angina pectoris: Secondary | ICD-10-CM | POA: Diagnosis not present

## 2019-01-31 DIAGNOSIS — K219 Gastro-esophageal reflux disease without esophagitis: Secondary | ICD-10-CM | POA: Diagnosis not present

## 2019-01-31 DIAGNOSIS — E119 Type 2 diabetes mellitus without complications: Secondary | ICD-10-CM | POA: Diagnosis not present

## 2019-01-31 DIAGNOSIS — R8290 Unspecified abnormal findings in urine: Secondary | ICD-10-CM | POA: Diagnosis not present

## 2019-01-31 DIAGNOSIS — C61 Malignant neoplasm of prostate: Secondary | ICD-10-CM | POA: Diagnosis not present

## 2019-01-31 DIAGNOSIS — Z471 Aftercare following joint replacement surgery: Secondary | ICD-10-CM | POA: Diagnosis not present

## 2019-01-31 DIAGNOSIS — Z87891 Personal history of nicotine dependence: Secondary | ICD-10-CM | POA: Diagnosis not present

## 2019-01-31 DIAGNOSIS — E785 Hyperlipidemia, unspecified: Secondary | ICD-10-CM | POA: Diagnosis not present

## 2019-02-07 DIAGNOSIS — R0602 Shortness of breath: Secondary | ICD-10-CM | POA: Diagnosis not present

## 2019-02-07 DIAGNOSIS — Z79899 Other long term (current) drug therapy: Secondary | ICD-10-CM | POA: Diagnosis not present

## 2019-02-07 DIAGNOSIS — E7800 Pure hypercholesterolemia, unspecified: Secondary | ICD-10-CM | POA: Diagnosis not present

## 2019-02-07 DIAGNOSIS — I10 Essential (primary) hypertension: Secondary | ICD-10-CM | POA: Diagnosis not present

## 2019-02-18 DIAGNOSIS — I2510 Atherosclerotic heart disease of native coronary artery without angina pectoris: Secondary | ICD-10-CM | POA: Diagnosis not present

## 2019-02-18 DIAGNOSIS — E7800 Pure hypercholesterolemia, unspecified: Secondary | ICD-10-CM | POA: Diagnosis not present

## 2019-02-18 DIAGNOSIS — R001 Bradycardia, unspecified: Secondary | ICD-10-CM | POA: Diagnosis not present

## 2019-02-18 DIAGNOSIS — I2584 Coronary atherosclerosis due to calcified coronary lesion: Secondary | ICD-10-CM | POA: Diagnosis not present

## 2019-02-18 DIAGNOSIS — I10 Essential (primary) hypertension: Secondary | ICD-10-CM | POA: Diagnosis not present

## 2019-02-19 DIAGNOSIS — E871 Hypo-osmolality and hyponatremia: Secondary | ICD-10-CM | POA: Diagnosis not present

## 2019-02-19 DIAGNOSIS — Z79899 Other long term (current) drug therapy: Secondary | ICD-10-CM | POA: Diagnosis not present

## 2019-02-19 DIAGNOSIS — I10 Essential (primary) hypertension: Secondary | ICD-10-CM | POA: Diagnosis not present

## 2019-03-05 DIAGNOSIS — R600 Localized edema: Secondary | ICD-10-CM | POA: Diagnosis not present

## 2019-03-05 DIAGNOSIS — I10 Essential (primary) hypertension: Secondary | ICD-10-CM | POA: Diagnosis not present

## 2019-03-08 DIAGNOSIS — M7989 Other specified soft tissue disorders: Secondary | ICD-10-CM | POA: Diagnosis not present

## 2019-03-08 DIAGNOSIS — I2510 Atherosclerotic heart disease of native coronary artery without angina pectoris: Secondary | ICD-10-CM | POA: Diagnosis not present

## 2019-03-08 DIAGNOSIS — I10 Essential (primary) hypertension: Secondary | ICD-10-CM | POA: Diagnosis not present

## 2019-03-12 DIAGNOSIS — Z79899 Other long term (current) drug therapy: Secondary | ICD-10-CM | POA: Diagnosis not present

## 2019-03-13 DIAGNOSIS — I2510 Atherosclerotic heart disease of native coronary artery without angina pectoris: Secondary | ICD-10-CM | POA: Diagnosis not present

## 2019-03-13 DIAGNOSIS — E7800 Pure hypercholesterolemia, unspecified: Secondary | ICD-10-CM | POA: Diagnosis not present

## 2019-03-13 DIAGNOSIS — R0602 Shortness of breath: Secondary | ICD-10-CM | POA: Diagnosis not present

## 2019-03-13 DIAGNOSIS — I10 Essential (primary) hypertension: Secondary | ICD-10-CM | POA: Diagnosis not present

## 2019-03-19 DIAGNOSIS — R0602 Shortness of breath: Secondary | ICD-10-CM | POA: Diagnosis not present

## 2019-03-25 DIAGNOSIS — I10 Essential (primary) hypertension: Secondary | ICD-10-CM | POA: Diagnosis not present

## 2019-03-25 DIAGNOSIS — R600 Localized edema: Secondary | ICD-10-CM | POA: Insufficient documentation

## 2019-04-15 ENCOUNTER — Other Ambulatory Visit: Payer: Self-pay

## 2019-04-15 ENCOUNTER — Ambulatory Visit (INDEPENDENT_AMBULATORY_CARE_PROVIDER_SITE_OTHER): Payer: PPO | Admitting: Vascular Surgery

## 2019-04-15 ENCOUNTER — Encounter (INDEPENDENT_AMBULATORY_CARE_PROVIDER_SITE_OTHER): Payer: Self-pay | Admitting: Vascular Surgery

## 2019-04-15 DIAGNOSIS — Z79899 Other long term (current) drug therapy: Secondary | ICD-10-CM

## 2019-04-15 DIAGNOSIS — K219 Gastro-esophageal reflux disease without esophagitis: Secondary | ICD-10-CM | POA: Diagnosis not present

## 2019-04-15 DIAGNOSIS — I25118 Atherosclerotic heart disease of native coronary artery with other forms of angina pectoris: Secondary | ICD-10-CM

## 2019-04-15 DIAGNOSIS — I890 Lymphedema, not elsewhere classified: Secondary | ICD-10-CM

## 2019-04-15 DIAGNOSIS — E782 Mixed hyperlipidemia: Secondary | ICD-10-CM

## 2019-04-15 DIAGNOSIS — I872 Venous insufficiency (chronic) (peripheral): Secondary | ICD-10-CM

## 2019-04-21 ENCOUNTER — Encounter (INDEPENDENT_AMBULATORY_CARE_PROVIDER_SITE_OTHER): Payer: Self-pay | Admitting: Vascular Surgery

## 2019-04-21 DIAGNOSIS — I890 Lymphedema, not elsewhere classified: Secondary | ICD-10-CM | POA: Insufficient documentation

## 2019-04-21 DIAGNOSIS — I872 Venous insufficiency (chronic) (peripheral): Secondary | ICD-10-CM | POA: Insufficient documentation

## 2019-04-21 DIAGNOSIS — K219 Gastro-esophageal reflux disease without esophagitis: Secondary | ICD-10-CM | POA: Insufficient documentation

## 2019-04-21 DIAGNOSIS — E785 Hyperlipidemia, unspecified: Secondary | ICD-10-CM | POA: Insufficient documentation

## 2019-04-21 NOTE — Progress Notes (Signed)
MRN : [id number]  Kiara Perry is a 84 y.o. ([birth year]) female who presents with chief complaint of  Chief Complaint  Patient presents with   New Patient (Initial Visit)    ref Doy Hutching le edema  .  History of Present Illness:  Patient is seen for evaluation of leg pain and leg swelling. The patient first noticed the swelling remotely. The swelling is associated with pain and discoloration. The pain and swelling worsens with prolonged dependency and improves with elevation. The pain is unrelated to activity.  The patient notes that in the morning the legs are significantly improved but they steadily worsened throughout the course of the day. The patient also notes a steady worsening of the discoloration in the ankle and shin area.   The patient denies claudication symptoms.  The patient denies symptoms consistent with rest pain.  The patient denies and extensive history of DJD and LS spine disease.  The patient has no had any past angiography, interventions or vascular surgery.  Elevation makes the leg symptoms better, dependency makes them much worse. There is no history of ulcerations. The patient denies any recent changes in medications.  The patient has not been wearing graduated compression.  The patient denies a history of DVT or PE. There is no prior history of phlebitis. There is no history of primary lymphedema.  No history of malignancies. No history of trauma or groin or pelvic surgery. There is no history of radiation treatment to the groin or pelvis  The patient denies amaurosis fugax or recent TIA symptoms. There are no recent neurological changes noted. The patient denies recent episodes of angina or shortness of breath   Current Meds  Medication Sig   Calcium Carbonate-Vitamin D 600-400 MG-UNIT tablet Take by mouth.   furosemide (LASIX) 20 MG tablet Take 20 mg by mouth daily.   gabapentin (NEURONTIN) 100 MG capsule TAKE 2 CAPSULES BY MOUTH NIGHTLY    isosorbide mononitrate (IMDUR) 30 MG 24 hr tablet Take 30 mg by mouth daily.   isosorbide mononitrate (IMDUR) 30 MG 24 hr tablet Take by mouth.   lovastatin (MEVACOR) 40 MG tablet TAKE 2 TABLETS (80 MG TOTAL) BY MOUTH DAILY WITH DINNER   naproxen (NAPROSYN) 500 MG tablet Take by mouth.   omeprazole (PRILOSEC) 40 MG capsule TAKE 1 CAPSULE (40 MG TOTAL) BY MOUTH ONCE DAILY.   torsemide (DEMADEX) 20 MG tablet 1 tablet twice a day for 3 days, then 1 tablet each morning    Past Medical History:  Diagnosis Date   Arthritis    Hyperlipidemia    Hypertension     Past Surgical History:  Procedure Laterality Date   BREAST BIOPSY Left 1973   neg   BREAST CYST ASPIRATION Left 1970   neg    Social History Social History   Tobacco Use   Smoking status: Never Smoker   Smokeless tobacco: Never Used  Substance Use Topics   Alcohol use: Never    Frequency: Never   Drug use: Never    Family History Family History  Problem Relation Age of Onset   Breast cancer Sister 42  No family history of bleeding/clotting disorders, porphyria or autoimmune disease   Allergies  Allergen Reactions   Levofloxacin Nausea Only     REVIEW OF SYSTEMS (Negative unless checked)  Constitutional: [] Weight loss  [] Fever  [] Chills Cardiac: [] Chest pain   [] Chest pressure   [] Palpitations   [] Shortness of breath when laying flat   [] Shortness of breath  with exertion. Vascular:  [] Pain in legs with walking   [x] Pain in legs at rest  [] History of DVT   [] Phlebitis   [x] Swelling in legs   [x] Varicose veins   [] Non-healing ulcers Pulmonary:   [] Uses home oxygen   [] Productive cough   [] Hemoptysis   [] Wheeze  [] COPD   [] Asthma Neurologic:  [] Dizziness   [] Seizures   [] History of stroke   [] History of TIA  [] Aphasia   [] Vissual changes   [] Weakness or numbness in arm   [] Weakness or numbness in leg Musculoskeletal:   [] Joint swelling   [] Joint pain   [] Low back pain Hematologic:  [] Easy bruising   [] Easy bleeding   [] Hypercoagulable state   [] Anemic Gastrointestinal:  [] Diarrhea   [] Vomiting  [x] Gastroesophageal reflux/heartburn   [] Difficulty swallowing. Genitourinary:  [] Chronic kidney disease   [] Difficult urination  [] Frequent urination   [] Blood in urine Skin:  [] Rashes   [] Ulcers  Psychological:  [] History of anxiety   []  History of major depression.  Physical Examination  Vitals:   04/15/19 1308  BP: 125/66  Pulse: 63  Resp: 16  Weight: 182 lb 12.8 oz (82.9 kg)  Height: 5\' 6"  (1.676 m)   Body mass index is 29.5 kg/m. Gen: WD/WN, NAD Head: [place]/AT, No temporalis wasting.  Ear/Nose/Throat: Hearing grossly intact, nares w/o erythema or drainage, poor dentition Eyes: PER, EOMI, sclera nonicteric.  Neck: Supple, no masses.  No bruit or JVD.  Pulmonary:  Good air movement, clear to auscultation bilaterally, no use of accessory muscles.  Cardiac: RRR, normal S1, S2, no Murmurs. Vascular:scattered varicosities present bilaterally.  Moderat venous stasis changes to the legs bilaterally.  3+ soft pitting edema Vessel Right Left  Radial Palpable Palpable  PT Palpable Palpable  DP Palpable Palpable  Gastrointestinal: soft, non-distended. No guarding/no peritoneal signs.  Musculoskeletal: M/S 5/5 throughout.  No deformity or atrophy.  Neurologic: CN 2-12 intact. Pain and light touch intact in extremities.  Symmetrical.  Speech is fluent. Motor exam as listed above. Psychiatric: Judgment intact, Mood & affect appropriate for pt's clinical situation. Dermatologic: Moderate venous rashes no ulcers noted.  No changes consistent with cellulitis. Lymph : No Cervical lymphadenopathy, no lichenification or skin changes of chronic lymphedema.  CBC No results found for: WBC, HGB, HCT, MCV, PLT  BMET    Component Value Date/Time   CREATININE 0.90 05/30/2018 1656   CrCl cannot be calculated (Patient's most recent lab result is older than the maximum 21 days allowed.).  COAG No  results found for: INR, PROTIME  Radiology No results found.   Assessment/Plan 1. Lymphedema No surgery or intervention at this point in time.    I have had a long discussion with the patient regarding venous insufficiency and why it  causes symptoms. I have discussed with the patient the chronic skin changes that accompany venous insufficiency and the long term sequela such as infection and ulceration.  Patient will begin wearing graduated compression stockings class 1 (20-30 mmHg) or compression wraps on a daily basis a prescription was given. The patient will put the stockings on first thing in the morning and removing them in the evening. The patient is instructed specifically not to sleep in the stockings.    In addition, behavioral modification including several periods of elevation of the lower extremities during the day will be continued. I have demonstrated that proper elevation is a position with the ankles at heart level.  The patient is instructed to begin routine exercise, especially walking on a daily  basis  Patient should undergo duplex ultrasound of the venous system to ensure that DVT or reflux is not present.  Following the review of the ultrasound the patient will follow up in 2-3 months to reassess the degree of swelling and the control that graduated compression stockings or compression wraps  is offering.   The patient can be assessed for a Lymph Pump at that time - VAS [place] LOWER EXTREMITY VENOUS REFLUX; Future  2. Venous insufficiency No surgery or intervention at this point in time.    I have had a long discussion with the patient regarding venous insufficiency and why it  causes symptoms. I have discussed with the patient the chronic skin changes that accompany venous insufficiency and the long term sequela such as infection and ulceration.  Patient will begin wearing graduated compression stockings class 1 (20-30 mmHg) or compression wraps on a daily basis a  prescription was given. The patient will put the stockings on first thing in the morning and removing them in the evening. The patient is instructed specifically not to sleep in the stockings.    In addition, behavioral modification including several periods of elevation of the lower extremities during the day will be continued. I have demonstrated that proper elevation is a position with the ankles at heart level.  The patient is instructed to begin routine exercise, especially walking on a daily basis  Patient should undergo duplex ultrasound of the venous system to ensure that DVT or reflux is not present.  Following the review of the ultrasound the patient will follow up in 2-3 months to reassess the degree of swelling and the control that graduated compression stockings or compression wraps  is offering.   The patient can be assessed for a Lymph Pump at that time - VAS [place] LOWER EXTREMITY VENOUS REFLUX; Future  3. Coronary artery disease of native artery of native heart with stable angina pectoris (HCC) Continue cardiac and antihypertensive medications as already ordered and reviewed, no changes at this time.  Continue statin as ordered and reviewed, no changes at this time  Nitrates PRN for chest pain   4. Gastroesophageal reflux disease without esophagitis Continue PPI as already ordered, this medication has been reviewed and there are no changes at this time.  Avoidence of caffeine and alcohol  Moderate elevation of the head of the bed   5. Mixed hyperlipidemia Continue statin as ordered and reviewed, no changes at this time     Hortencia Pilar, MD  04/21/2019 3:43 PM

## 2019-05-03 DIAGNOSIS — M1712 Unilateral primary osteoarthritis, left knee: Secondary | ICD-10-CM | POA: Diagnosis not present

## 2019-06-14 ENCOUNTER — Telehealth (INDEPENDENT_AMBULATORY_CARE_PROVIDER_SITE_OTHER): Payer: Self-pay

## 2019-06-14 NOTE — Telephone Encounter (Signed)
Patient had left a voicemail asking do she need to wear her compression stockings when she come in 06/17/2019 for appointment because sometimes its harder to take the stockings off. I spoke with Eulogio Ditch NP and she said its ok for the patient not to wear the compression stockings. The patient has been made aware and verbalize understanding.

## 2019-06-17 ENCOUNTER — Encounter (INDEPENDENT_AMBULATORY_CARE_PROVIDER_SITE_OTHER): Payer: Self-pay

## 2019-06-17 ENCOUNTER — Ambulatory Visit (INDEPENDENT_AMBULATORY_CARE_PROVIDER_SITE_OTHER): Payer: PPO

## 2019-06-17 ENCOUNTER — Ambulatory Visit (INDEPENDENT_AMBULATORY_CARE_PROVIDER_SITE_OTHER): Payer: PPO | Admitting: Vascular Surgery

## 2019-06-17 ENCOUNTER — Encounter (INDEPENDENT_AMBULATORY_CARE_PROVIDER_SITE_OTHER): Payer: Self-pay | Admitting: Vascular Surgery

## 2019-06-17 ENCOUNTER — Other Ambulatory Visit: Payer: Self-pay

## 2019-06-17 VITALS — BP 137/72 | HR 60 | Resp 12 | Ht 66.0 in | Wt 178.0 lb

## 2019-06-17 DIAGNOSIS — K219 Gastro-esophageal reflux disease without esophagitis: Secondary | ICD-10-CM | POA: Diagnosis not present

## 2019-06-17 DIAGNOSIS — I83023 Varicose veins of left lower extremity with ulcer of ankle: Secondary | ICD-10-CM | POA: Diagnosis not present

## 2019-06-17 DIAGNOSIS — I872 Venous insufficiency (chronic) (peripheral): Secondary | ICD-10-CM

## 2019-06-17 DIAGNOSIS — L97329 Non-pressure chronic ulcer of left ankle with unspecified severity: Secondary | ICD-10-CM | POA: Insufficient documentation

## 2019-06-17 DIAGNOSIS — I25118 Atherosclerotic heart disease of native coronary artery with other forms of angina pectoris: Secondary | ICD-10-CM

## 2019-06-17 DIAGNOSIS — I890 Lymphedema, not elsewhere classified: Secondary | ICD-10-CM | POA: Diagnosis not present

## 2019-06-17 NOTE — Progress Notes (Signed)
MRN : [id number]  Kiara Perry is a 84 y.o. (March 30, 1935) female who presents with chief complaint of  Chief Complaint  Patient presents with  . Follow-up  .  History of Present Illness:   The patient returns to the office for followup evaluation regarding leg swelling.  The left leg is significantly worse than the right leg.  The swelling has persisted and the pain associated with swelling continues. There has been an interval development of an ulceration of the left shin.  Her husband scratched her while putting on her stocking.  Since the previous visit the patient has been wearing graduated compression stockings and has noted some improvement in the lymphedema. The patient has been using compression routinely morning until night.  The patient also states elevation during the day and exercise is being done too.  Duplex ultrasound of the venous system shows normal right venous system and both deep and superficial system with reflux.   Current Meds  Medication Sig  . bisoprolol (ZEBETA) 5 MG tablet Take 5 mg by mouth daily.  . Calcium Carbonate-Vitamin D 600-400 MG-UNIT tablet Take by mouth.  . furosemide (LASIX) 20 MG tablet Take 20 mg by mouth daily.  Marland Kitchen gabapentin (NEURONTIN) 100 MG capsule TAKE 2 CAPSULES BY MOUTH NIGHTLY  . isosorbide mononitrate (IMDUR) 30 MG 24 hr tablet Take 30 mg by mouth daily.  Marland Kitchen losartan (COZAAR) 100 MG tablet   . lovastatin (MEVACOR) 40 MG tablet TAKE 2 TABLETS (80 MG TOTAL) BY MOUTH DAILY WITH DINNER  . naproxen (NAPROSYN) 500 MG tablet Take by mouth.  Marland Kitchen omeprazole (PRILOSEC) 40 MG capsule TAKE 1 CAPSULE (40 MG TOTAL) BY MOUTH ONCE DAILY.  Marland Kitchen torsemide (DEMADEX) 20 MG tablet 1 tablet twice a day for 3 days, then 1 tablet each morning    Past Medical History:  Diagnosis Date  . Arthritis   . Hyperlipidemia   . Hypertension     Past Surgical History:  Procedure Laterality Date  . BREAST BIOPSY Left 1973   neg  . BREAST CYST ASPIRATION  Left 1970   neg    Social History Social History   Tobacco Use  . Smoking status: Never Smoker  . Smokeless tobacco: Never Used  Substance Use Topics  . Alcohol use: Never    Frequency: Never  . Drug use: Never    Family History Family History  Problem Relation Age of Onset  . Breast cancer Sister 35    Allergies  Allergen Reactions  . Levofloxacin Nausea Only     REVIEW OF SYSTEMS (Negative unless checked)  Constitutional: [] Weight loss  [] Fever  [] Chills Cardiac: [] Chest pain   [] Chest pressure   [] Palpitations   [] Shortness of breath when laying flat   [] Shortness of breath with exertion. Vascular:  [] Pain in legs with walking   [] Pain in legs at rest  [] History of DVT   [] Phlebitis   [x] Swelling in legs   [x] Varicose veins   [] Non-healing ulcers Pulmonary:   [] Uses home oxygen   [] Productive cough   [] Hemoptysis   [] Wheeze  [] COPD   [] Asthma Neurologic:  [] Dizziness   [] Seizures   [] History of stroke   [] History of TIA  [] Aphasia   [] Vissual changes   [] Weakness or numbness in arm   [] Weakness or numbness in leg Musculoskeletal:   [] Joint swelling   [] Joint pain   [] Low back pain Hematologic:  [] Easy bruising  [] Easy bleeding   [] Hypercoagulable state   [] Anemic Gastrointestinal:  [] Diarrhea   []   Vomiting  [x] Gastroesophageal reflux/heartburn   [] Difficulty swallowing. Genitourinary:  [] Chronic kidney disease   [] Difficult urination  [] Frequent urination   [] Blood in urine Skin:  [x] Rashes   [x] Ulcers  Psychological:  [] History of anxiety   []  History of major depression.  Physical Examination  Vitals:   06/17/19 1430  BP: 137/72  Pulse: 60  Resp: 12  Weight: 178 lb (80.7 kg)  Height: 5\' 6"  (1.676 m)   Body mass index is 28.73 kg/m. Gen: WD/WN, NAD Head: [place]/AT, No temporalis wasting.  Ear/Nose/Throat: Hearing grossly intact, nares w/o erythema or drainage Eyes: PER, EOMI, sclera nonicteric.  Neck: Supple, no large masses.   Pulmonary:  Good air movement,  no audible wheezing bilaterally, no use of accessory muscles.  Cardiac: RRR, no JVD Vascular: 2-3+ edema of the left leg with severe venous changes of the left leg.  Venous ulcer noted in the ankle area on the left, noninfected Vessel Right Left  PT Palpable Palpable  DP Palpable Palpable  Gastrointestinal: Non-distended. No guarding/no peritoneal signs.  Musculoskeletal: M/S 5/5 throughout.  No deformity or atrophy.  Neurologic: CN 2-12 intact. Symmetrical.  Speech is fluent. Motor exam as listed above. Psychiatric: Judgment intact, Mood & affect appropriate for pt's clinical situation. Dermatologic: Venous stasis dermatitis with ulcers present on the left.  No changes consistent with cellulitis. Lymph : No lichenification or skin changes of chronic lymphedema.  CBC No results found for: WBC, HGB, HCT, MCV, PLT  BMET    Component Value Date/Time   CREATININE 0.90 05/30/2018 1656   CrCl cannot be calculated (Patient's most recent lab result is older than the maximum 21 days allowed.).  COAG No results found for: INR, PROTIME  Radiology No results found.    Assessment/Plan 1. Venous ulcer of ankle, left (HCC) No surgery or intervention at this point in time.    I have had a long discussion with the patient regarding venous insufficiency and why it  causes symptoms, specifically venous ulceration . I have discussed with the patient the chronic skin changes that accompany venous insufficiency and the long term sequela such as infection and recurring  ulceration.  Patient will be placed in [employer] which will be changed weekly drainage permitting.  In addition, behavioral modification including several periods of elevation of the lower extremities during the day will be continued. Achieving a position with the ankles at heart level was stressed to the patient  The patient is instructed to begin routine exercise, especially walking on a daily basis  Following the review of the  ultrasound the patient will follow up in one week to reassess the degree of swelling and the control that [place] therapy is offering.   The patient can be assessed for graduated compression stockings or wraps as well as a Lymph Pump once the ulcers are healed.   2. Venous insufficiency No surgery or intervention at this point in time.    I have had a long discussion with the patient regarding venous insufficiency and why it  causes symptoms. I have discussed with the patient the chronic skin changes that accompany venous insufficiency and the long term sequela such as infection and ulceration.  Patient will begin wearing graduated compression stockings class 1 (20-30 mmHg) or compression wraps on a daily basis a prescription was given. The patient will put the stockings on first thing in the morning and removing them in the evening. The patient is instructed specifically not to sleep in the stockings.  In addition, behavioral modification including several periods of elevation of the lower extremities during the day will be continued. I have demonstrated that proper elevation is a position with the ankles at heart level.  The patient is instructed to begin routine exercise, especially walking on a daily basis   3. Lymphedema  No surgery or intervention at this point in time.    I have reviewed my previous discussion with the patient regarding swelling and why it  causes symptoms.  The patient is doing well with compression and will continue wearing graduated compression stockings class 1 (20-30 mmHg) on a daily basis a prescription was given. The patient will  continue wearing the stockings first thing in the morning and removing them in the evening. The patient is instructed specifically not to sleep in the stockings.    In addition, behavioral modification including elevation during the day and exercise will be continued.    4. Coronary artery disease of native artery of native heart with  stable angina pectoris (HCC) Continue cardiac and antihypertensive medications as already ordered and reviewed, no changes at this time.  Continue statin as ordered and reviewed, no changes at this time  Nitrates PRN for chest pain   5. Gastroesophageal reflux disease without esophagitis Continue PPI as already ordered, this medication has been reviewed and there are no changes at this time.  Avoidence of caffeine and alcohol  Moderate elevation of the head of the bed     Hortencia Pilar, MD  06/17/2019 3:33 PM

## 2019-06-24 ENCOUNTER — Encounter (INDEPENDENT_AMBULATORY_CARE_PROVIDER_SITE_OTHER): Payer: Self-pay | Admitting: Vascular Surgery

## 2019-06-24 ENCOUNTER — Other Ambulatory Visit: Payer: Self-pay

## 2019-06-24 ENCOUNTER — Ambulatory Visit (INDEPENDENT_AMBULATORY_CARE_PROVIDER_SITE_OTHER): Payer: PPO | Admitting: Vascular Surgery

## 2019-06-24 VITALS — BP 147/75 | HR 56 | Resp 10 | Ht 66.0 in | Wt 179.0 lb

## 2019-06-24 DIAGNOSIS — I10 Essential (primary) hypertension: Secondary | ICD-10-CM | POA: Diagnosis not present

## 2019-06-24 DIAGNOSIS — I890 Lymphedema, not elsewhere classified: Secondary | ICD-10-CM | POA: Diagnosis not present

## 2019-06-24 DIAGNOSIS — E782 Mixed hyperlipidemia: Secondary | ICD-10-CM | POA: Diagnosis not present

## 2019-06-24 DIAGNOSIS — M1990 Unspecified osteoarthritis, unspecified site: Secondary | ICD-10-CM | POA: Insufficient documentation

## 2019-06-24 DIAGNOSIS — I872 Venous insufficiency (chronic) (peripheral): Secondary | ICD-10-CM | POA: Diagnosis not present

## 2019-06-24 DIAGNOSIS — I2510 Atherosclerotic heart disease of native coronary artery without angina pectoris: Secondary | ICD-10-CM | POA: Diagnosis not present

## 2019-06-24 DIAGNOSIS — G894 Chronic pain syndrome: Secondary | ICD-10-CM | POA: Insufficient documentation

## 2019-06-24 DIAGNOSIS — I2584 Coronary atherosclerosis due to calcified coronary lesion: Secondary | ICD-10-CM

## 2019-06-24 DIAGNOSIS — M810 Age-related osteoporosis without current pathological fracture: Secondary | ICD-10-CM | POA: Insufficient documentation

## 2019-06-24 DIAGNOSIS — F5104 Psychophysiologic insomnia: Secondary | ICD-10-CM | POA: Insufficient documentation

## 2019-06-24 NOTE — Progress Notes (Signed)
MRN : [id number]  Kiara Perry is a 84 y.o. (07-Feb-1935) female who presents with chief complaint of  Chief Complaint  Patient presents with   Follow-up  .  History of Present Illness:   The patient returns to the office for followup evaluation regarding leg swelling associated with traumatic ulceration of her ankle.  The swelling has improved quite a bit and the pain associated with swelling has decreased substantially. There have not been any interval development of a ulcerations or wounds.  She has been doing well with her Unna boot wraps.  Since the previous visit the patient has been wearing graduated compression stockings and has noted little significant improvement in the lymphedema. The patient has been using compression routinely morning until night.  The patient also states elevation during the day and exercise is being done too.     Current Meds  Medication Sig   bisoprolol (ZEBETA) 5 MG tablet Take 5 mg by mouth daily.   Calcium Carbonate-Vitamin D 600-400 MG-UNIT tablet Take by mouth.   furosemide (LASIX) 20 MG tablet Take 20 mg by mouth daily.   gabapentin (NEURONTIN) 100 MG capsule TAKE 2 CAPSULES BY MOUTH NIGHTLY   isosorbide mononitrate (IMDUR) 30 MG 24 hr tablet Take 30 mg by mouth daily.   losartan (COZAAR) 100 MG tablet    lovastatin (MEVACOR) 40 MG tablet TAKE 2 TABLETS (80 MG TOTAL) BY MOUTH DAILY WITH DINNER   naproxen (NAPROSYN) 500 MG tablet Take by mouth.   omeprazole (PRILOSEC) 40 MG capsule TAKE 1 CAPSULE (40 MG TOTAL) BY MOUTH ONCE DAILY.   torsemide (DEMADEX) 20 MG tablet 1 tablet twice a day for 3 days, then 1 tablet each morning   traMADol (ULTRAM) 50 MG tablet Take 50 mg by mouth every 6 (six) hours as needed. for pain    Past Medical History:  Diagnosis Date   Arthritis    Hyperlipidemia    Hypertension     Past Surgical History:  Procedure Laterality Date   BREAST BIOPSY Left 1973   neg   BREAST CYST ASPIRATION  Left 1970   neg    Social History Social History   Tobacco Use   Smoking status: Never Smoker   Smokeless tobacco: Never Used  Substance Use Topics   Alcohol use: Never    Frequency: Never   Drug use: Never    Family History Family History  Problem Relation Age of Onset   Breast cancer Sister 36    Allergies  Allergen Reactions   Levofloxacin Nausea Only     REVIEW OF SYSTEMS (Negative unless checked)  Constitutional: [] Weight loss  [] Fever  [] Chills Cardiac: [] Chest pain   [] Chest pressure   [] Palpitations   [] Shortness of breath when laying flat   [] Shortness of breath with exertion. Vascular:  [] Pain in legs with walking   [] Pain in legs at rest  [] History of DVT   [] Phlebitis   [x] Swelling in legs   [x] Varicose veins   [] Non-healing ulcers Pulmonary:   [] Uses home oxygen   [] Productive cough   [] Hemoptysis   [] Wheeze  [] COPD   [] Asthma Neurologic:  [] Dizziness   [] Seizures   [] History of stroke   [] History of TIA  [] Aphasia   [] Vissual changes   [] Weakness or numbness in arm   [] Weakness or numbness in leg Musculoskeletal:   [] Joint swelling   [] Joint pain   [] Low back pain Hematologic:  [] Easy bruising  [] Easy bleeding   [] Hypercoagulable state   [] Anemic  Gastrointestinal:  [] Diarrhea   [] Vomiting  [] Gastroesophageal reflux/heartburn   [] Difficulty swallowing. Genitourinary:  [] Chronic kidney disease   [] Difficult urination  [] Frequent urination   [] Blood in urine Skin:  [x] Rashes   [x] Ulcers  Psychological:  [] History of anxiety   []  History of major depression.  Physical Examination  Vitals:   06/24/19 0957  BP: (!) 147/75  Pulse: (!) 56  Resp: 10  Weight: 179 lb (81.2 kg)  Height: 5\' 6"  (1.676 m)   Body mass index is 28.89 kg/m. Gen: WD/WN, NAD Head: [place]/AT, No temporalis wasting.  Ear/Nose/Throat: Hearing grossly intact, nares w/o erythema or drainage Eyes: PER, EOMI, sclera nonicteric.  Neck: Supple, no large masses.   Pulmonary:  Good air  movement, no audible wheezing bilaterally, no use of accessory muscles.  Cardiac: RRR, no JVD Vascular: scattered varicosities present bilaterally.  Moderate venous stasis changes to the legs bilaterally.  1-2+ soft pitting edema.  Previous left ankle ulcer is now healed Vessel Right Left  PT Palpable Palpable  DP Palpable Palpable  Gastrointestinal: Non-distended. No guarding/no peritoneal signs.  Musculoskeletal: M/S 5/5 throughout.  No deformity or atrophy.  Neurologic: CN 2-12 intact. Symmetrical.  Speech is fluent. Motor exam as listed above. Psychiatric: Judgment intact, Mood & affect appropriate for pt's clinical situation. Dermatologic: Venous rashes no ulcers noted.  No changes consistent with cellulitis. Lymph : No lichenification or skin changes of chronic lymphedema.  CBC No results found for: WBC, HGB, HCT, MCV, PLT  BMET    Component Value Date/Time   CREATININE 0.90 05/30/2018 1656   CrCl cannot be calculated (Patient's most recent lab result is older than the maximum 21 days allowed.).  COAG No results found for: INR, PROTIME  Radiology Vas [place] Lower Extremity Venous Reflux  Result Date: 06/17/2019  Lower Venous Reflux Study Indications: Swelling, and left.  Performing Technologist: Concha [place] RVT  Examination Guidelines: A complete evaluation includes B-mode imaging, spectral Doppler, color Doppler, and power Doppler as needed of all accessible portions of each vessel. Bilateral testing is considered an integral part of a complete examination. Limited examinations for reoccurring indications may be performed as noted. The reflux portion of the exam is performed with the patient in reverse Trendelenburg.  +-----+---------------+---------+-----------+----------+--------------+  RIGHT Compressibility Phasicity Spontaneity Properties Thrombus Aging  +-----+---------------+---------+-----------+----------+--------------+        Full            Yes       Yes                                     +-----+---------------+---------+-----------+----------+--------------+   +----+---------------+---------+-----------+----------+--------------+  LEFT Compressibility Phasicity Spontaneity Properties Thrombus Aging  +----+---------------+---------+-----------+----------+--------------+       Full            Yes       Yes                                    +----+---------------+---------+-----------+----------+--------------+   Venous Reflux Times Normal value < 0.5 sec +--------------+----------+---------+                 Right (ms) Left (ms)  +--------------+----------+---------+  CFV            785.00                +--------------+----------+---------+  Popliteal  1159.00    +--------------+----------+---------+  GSV mid thigh             3051.00    +--------------+----------+---------+  GSV dist thigh            763.00     +--------------+----------+---------+ +-------------------+----------+---------+  VEIN DIAMETERS:     Right (cm) Left (cm)  +-------------------+----------+---------+  GSV at mid thigh               .38        +-------------------+----------+---------+  GSV at distal thigh            .45        +-------------------+----------+---------+   Summary: Right: There is no evidence of deep vein thrombosis in the lower extremity.There is no evidence of superficial venous thrombosis.There is no evidence of chronic venous insufficiency. Left: Abnormal reflux times were noted in the popliteal vein, great saphenous vein at the mid thigh, and great saphenous vein at the distal thigh. There is no evidence of deep vein thrombosis in the lower extremity.There is no evidence of superficial venous thrombosis.  *See table(s) above for measurements and observations. Electronically signed by Hortencia Pilar MD on 06/17/2019 at 4:58:42 PM.    Final       Assessment/Plan 1. Venous insufficiency  No surgery or intervention at this point in time.    I have reviewed my previous  discussion with the patient regarding swelling and why it  causes symptoms.  The patient is doing well with compression and will continue wearing graduated compression stockings class 1 (20-30 mmHg) on a daily basis a prescription was given. The patient will  continue wearing the stockings first thing in the morning and removing them in the evening. The patient is instructed specifically not to sleep in the stockings.    In addition, behavioral modification including elevation during the day and exercise will be continued.    Patient should follow-up in one month  2. Lymphedema  No surgery or intervention at this point in time.    I have reviewed my previous discussion with the patient regarding swelling and why it  causes symptoms.  The patient is doing well with compression and will continue wearing graduated compression stockings class 1 (20-30 mmHg) on a daily basis a prescription was given. The patient will  continue wearing the stockings first thing in the morning and removing them in the evening. The patient is instructed specifically not to sleep in the stockings.    In addition, behavioral modification including elevation during the day and exercise will be continued.    Patient should follow-up in one month   3. Coronary artery calcification Continue cardiac and antihypertensive medications as already ordered and reviewed, no changes at this time.  Continue statin as ordered and reviewed, no changes at this time  Nitrates PRN for chest pain   4. Essential hypertension Continue antihypertensive medications as already ordered, these medications have been reviewed and there are no changes at this time.   5. Mixed hyperlipidemia Continue statin as ordered and reviewed, no changes at this time     Hortencia Pilar, MD  06/24/2019 10:18 AM

## 2019-07-12 DIAGNOSIS — Z23 Encounter for immunization: Secondary | ICD-10-CM | POA: Diagnosis not present

## 2019-07-15 ENCOUNTER — Other Ambulatory Visit: Payer: Self-pay | Admitting: Internal Medicine

## 2019-07-15 DIAGNOSIS — Z1231 Encounter for screening mammogram for malignant neoplasm of breast: Secondary | ICD-10-CM

## 2019-07-16 DIAGNOSIS — M1712 Unilateral primary osteoarthritis, left knee: Secondary | ICD-10-CM | POA: Diagnosis not present

## 2019-07-25 DIAGNOSIS — I700 Atherosclerosis of aorta: Secondary | ICD-10-CM | POA: Diagnosis not present

## 2019-07-25 DIAGNOSIS — Z0000 Encounter for general adult medical examination without abnormal findings: Secondary | ICD-10-CM | POA: Diagnosis not present

## 2019-07-25 DIAGNOSIS — Z1211 Encounter for screening for malignant neoplasm of colon: Secondary | ICD-10-CM | POA: Diagnosis not present

## 2019-07-25 DIAGNOSIS — Z79899 Other long term (current) drug therapy: Secondary | ICD-10-CM | POA: Diagnosis not present

## 2019-07-25 DIAGNOSIS — E7800 Pure hypercholesterolemia, unspecified: Secondary | ICD-10-CM | POA: Diagnosis not present

## 2019-07-25 DIAGNOSIS — Z1231 Encounter for screening mammogram for malignant neoplasm of breast: Secondary | ICD-10-CM | POA: Diagnosis not present

## 2019-07-25 DIAGNOSIS — I2510 Atherosclerotic heart disease of native coronary artery without angina pectoris: Secondary | ICD-10-CM | POA: Diagnosis not present

## 2019-07-25 DIAGNOSIS — I10 Essential (primary) hypertension: Secondary | ICD-10-CM | POA: Diagnosis not present

## 2019-07-26 ENCOUNTER — Other Ambulatory Visit: Payer: Self-pay

## 2019-07-26 ENCOUNTER — Encounter (INDEPENDENT_AMBULATORY_CARE_PROVIDER_SITE_OTHER): Payer: Self-pay | Admitting: Nurse Practitioner

## 2019-07-26 ENCOUNTER — Ambulatory Visit (INDEPENDENT_AMBULATORY_CARE_PROVIDER_SITE_OTHER): Payer: PPO | Admitting: Nurse Practitioner

## 2019-07-26 VITALS — BP 133/69 | HR 57 | Resp 14 | Ht 66.0 in | Wt 176.0 lb

## 2019-07-26 DIAGNOSIS — I10 Essential (primary) hypertension: Secondary | ICD-10-CM | POA: Diagnosis not present

## 2019-07-26 DIAGNOSIS — E782 Mixed hyperlipidemia: Secondary | ICD-10-CM | POA: Diagnosis not present

## 2019-07-26 DIAGNOSIS — I890 Lymphedema, not elsewhere classified: Secondary | ICD-10-CM | POA: Diagnosis not present

## 2019-07-30 ENCOUNTER — Encounter (INDEPENDENT_AMBULATORY_CARE_PROVIDER_SITE_OTHER): Payer: Self-pay | Admitting: Nurse Practitioner

## 2019-07-30 DIAGNOSIS — Z1211 Encounter for screening for malignant neoplasm of colon: Secondary | ICD-10-CM | POA: Diagnosis not present

## 2019-07-30 NOTE — Progress Notes (Signed)
SUBJECTIVE:  Patient ID: Kiara Perry, female    DOB: Mar 25, 1935, 84 y.o.   MRN: [id number] Chief Complaint  Patient presents with  . Follow-up    one month f/u    HPI  Kiara Perry is a 84 y.o. female The patient returns to the office for followup evaluation regarding leg swelling.  The swelling has persisted and the pain associated with swelling continues. There have not been any interval development of a ulcerations or wounds.  Since the previous visit, over 4 weeks ago,  the patient has been wearing graduated compression stockings and has noted little if any improvement in the lymphedema. The patient has been using compression routinely morning until night.  The patient also states elevation during the day and exercise is being done too.   Past Medical History:  Diagnosis Date  . Arthritis   . Hyperlipidemia   . Hypertension     Past Surgical History:  Procedure Laterality Date  . BREAST BIOPSY Left 1973   neg  . BREAST CYST ASPIRATION Left 1970   neg    Social History   Socioeconomic History  . Marital status: Married    Spouse name: Not on file  . Number of children: Not on file  . Years of education: Not on file  . Highest education level: Not on file  Occupational History  . Not on file  Social Needs  . Financial resource strain: Not on file  . Food insecurity    Worry: Not on file    Inability: Not on file  . Transportation needs    Medical: Not on file    Non-medical: Not on file  Tobacco Use  . Smoking status: Never Smoker  . Smokeless tobacco: Never Used  Substance and Sexual Activity  . Alcohol use: Never    Frequency: Never  . Drug use: Never  . Sexual activity: Not on file  Lifestyle  . Physical activity    Days per week: Not on file    Minutes per session: Not on file  . Stress: Not on file  Relationships  . Social Herbalist on phone: Not on file    Gets together: Not on file    Attends religious service: Not  on file    Active member of club or organization: Not on file    Attends meetings of clubs or organizations: Not on file    Relationship status: Not on file  . Intimate partner violence    Fear of current or ex partner: Not on file    Emotionally abused: Not on file    Physically abused: Not on file    Forced sexual activity: Not on file  Other Topics Concern  . Not on file  Social History Narrative  . Not on file    Family History  Problem Relation Age of Onset  . Breast cancer Sister 42    Allergies  Allergen Reactions  . Levofloxacin Nausea Only     Review of Systems   Review of Systems: Negative Unless Checked Constitutional: [] Weight loss  [] Fever  [] Chills Cardiac: [] Chest pain   []  Atrial Fibrillation  [] Palpitations   [] Shortness of breath when laying flat   [] Shortness of breath with exertion. [] Shortness of breath at rest Vascular:  [] Pain in legs with walking   [] Pain in legs with standing [] Pain in legs when laying flat   [] Claudication    [] Pain in feet when laying flat    []   History of DVT   [] Phlebitis   [x] Swelling in legs   [] Varicose veins   [] Non-healing ulcers Pulmonary:   [] Uses home oxygen   [] Productive cough   [] Hemoptysis   [] Wheeze  [] COPD   [] Asthma Neurologic:  [] Dizziness   [] Seizures  [] Blackouts [] History of stroke   [] History of TIA  [] Aphasia   [] Temporary Blindness   [] Weakness or numbness in arm   [] Weakness or numbness in leg Musculoskeletal:   [] Joint swelling   [] Joint pain   [] Low back pain  []  History of Knee Replacement [x] Arthritis [] back Surgeries  []  Spinal Stenosis    Hematologic:  [] Easy bruising  [] Easy bleeding   [] Hypercoagulable state   [] Anemic Gastrointestinal:  [] Diarrhea   [] Vomiting  [x] Gastroesophageal reflux/heartburn   [] Difficulty swallowing. [] Abdominal pain Genitourinary:  [] Chronic kidney disease   [] Difficult urination  [] Anuric   [] Blood in urine [] Frequent urination  [] Burning with urination   [] Hematuria Skin:   [] Rashes   [] Ulcers [] Wounds Psychological:  [] History of anxiety   []  History of major depression  []  Memory Difficulties      OBJECTIVE:   Physical Exam  BP 133/69 (BP Location: Right Arm)   Pulse (!) 57   Resp 14   Ht 5\' 6"  (1.676 m)   Wt 176 lb (79.8 kg)   BMI 28.41 kg/m   Gen: WD/WN, NAD Head: [place]/AT, No temporalis wasting.  Ear/Nose/Throat: Hearing grossly intact, nares w/o erythema or drainage Eyes: PER, EOMI, sclera nonicteric.  Neck: Supple, no masses.  No JVD.  Pulmonary:  Good air movement, no use of accessory muscles.  Cardiac: RRR Vascular: 3+ soft edema bilateral  Vessel Right Left  Radial Palpable Palpable  Dorsalis Pedis Palpable Palpable  Posterior Tibial Palpable Palpable   Gastrointestinal: soft, non-distended. No guarding/no peritoneal signs.  Musculoskeletal: M/S 5/5 throughout.  No deformity or atrophy.  Neurologic: Pain and light touch intact in extremities.  Symmetrical.  Speech is fluent. Motor exam as listed above. Psychiatric: Judgment intact, Mood & affect appropriate for pt's clinical situation. Dermatologic: Bilateral venous stasis changes. No Ulcers Noted.  No changes consistent with cellulitis. Lymph : No Cervical lymphadenopathy, dermal thickening bilaterally      ASSESSMENT AND PLAN:  1. Lymphedema Recommend:  No surgery or intervention at this point in time.    I have reviewed my previous discussion with the patient regarding swelling and why it causes symptoms.  Patient will continue wearing graduated compression stockings class 1 (20-30 mmHg) on a daily basis. The patient will  beginning wearing the stockings first thing in the morning and removing them in the evening. The patient is instructed specifically not to sleep in the stockings.    In addition, behavioral modification including several periods of elevation of the lower extremities during the day will be continued.  This was reviewed with the patient during the initial visit.   The patient will also continue routine exercise, especially walking on a daily basis as was discussed during the initial visit.    Despite conservative treatments including graduated compression therapy class 1 and behavioral modification including exercise and elevation the patient  has not obtained adequate control of the lymphedema.  The patient still has stage 3 lymphedema and therefore, I believe that a lymph pump should be added to improve the control of the patient's lymphedema.  Additionally, a lymph pump is warranted because it will reduce the risk of cellulitis and ulceration in the future.  Patient should follow-up in six months  2. Mixed hyperlipidemia Continue statin as ordered and reviewed, no changes at this time   3. Essential hypertension Continue antihypertensive medications as already ordered, these medications have been reviewed and there are no changes at this time.    Current Outpatient Medications on File Prior to Visit  Medication Sig Dispense Refill  . bisoprolol (ZEBETA) 5 MG tablet Take 5 mg by mouth daily.    . Calcium Carbonate-Vitamin D 600-400 MG-UNIT tablet Take by mouth.    . furosemide (LASIX) 20 MG tablet Take 20 mg by mouth daily.    Marland Kitchen gabapentin (NEURONTIN) 100 MG capsule TAKE 2 CAPSULES BY MOUTH NIGHTLY    . isosorbide mononitrate (IMDUR) 30 MG 24 hr tablet Take 30 mg by mouth daily.    Marland Kitchen losartan (COZAAR) 100 MG tablet     . lovastatin (MEVACOR) 40 MG tablet TAKE 2 TABLETS (80 MG TOTAL) BY MOUTH DAILY WITH DINNER    . naproxen (NAPROSYN) 500 MG tablet Take by mouth.    Marland Kitchen omeprazole (PRILOSEC) 40 MG capsule TAKE 1 CAPSULE (40 MG TOTAL) BY MOUTH ONCE DAILY.    Marland Kitchen torsemide (DEMADEX) 20 MG tablet 1 tablet twice a day for 3 days, then 1 tablet each morning    . traMADol (ULTRAM) 50 MG tablet Take 50 mg by mouth every 6 (six) hours as needed. for pain     No current facility-administered medications on file prior to visit.     There are no Patient  Instructions on file for this visit. No follow-ups on file.   Kris Hartmann, NP  This note was completed with Sales executive.  Any errors are purely unintentional.

## 2019-08-08 DIAGNOSIS — E7800 Pure hypercholesterolemia, unspecified: Secondary | ICD-10-CM | POA: Diagnosis not present

## 2019-08-08 DIAGNOSIS — Z79899 Other long term (current) drug therapy: Secondary | ICD-10-CM | POA: Diagnosis not present

## 2019-08-08 DIAGNOSIS — I10 Essential (primary) hypertension: Secondary | ICD-10-CM | POA: Diagnosis not present

## 2019-08-21 DIAGNOSIS — I700 Atherosclerosis of aorta: Secondary | ICD-10-CM | POA: Diagnosis not present

## 2019-08-21 DIAGNOSIS — E7800 Pure hypercholesterolemia, unspecified: Secondary | ICD-10-CM | POA: Diagnosis not present

## 2019-08-21 DIAGNOSIS — I2510 Atherosclerotic heart disease of native coronary artery without angina pectoris: Secondary | ICD-10-CM | POA: Diagnosis not present

## 2019-08-21 DIAGNOSIS — I10 Essential (primary) hypertension: Secondary | ICD-10-CM | POA: Diagnosis not present

## 2019-08-28 ENCOUNTER — Ambulatory Visit
Admission: RE | Admit: 2019-08-28 | Discharge: 2019-08-28 | Disposition: A | Discharge status: Home / Self Care | Payer: PPO | Source: Ambulatory Visit | Attending: Internal Medicine | Admitting: Internal Medicine

## 2019-08-28 DIAGNOSIS — Z1231 Encounter for screening mammogram for malignant neoplasm of breast: Secondary | ICD-10-CM | POA: Insufficient documentation

## 2019-09-16 DIAGNOSIS — Z20828 Contact with and (suspected) exposure to other viral communicable diseases: Secondary | ICD-10-CM | POA: Diagnosis not present

## 2019-09-23 ENCOUNTER — Telehealth (INDEPENDENT_AMBULATORY_CARE_PROVIDER_SITE_OTHER): Payer: Self-pay

## 2019-09-23 NOTE — Telephone Encounter (Signed)
Looks like the patient was last seen by Arna Medici and a lymphedema pump process was started. Since the wound has been present for over two weeks the patient should be placed in a right lower extremity unna wrap to promote healing. The patient has undergone venous studies but no arterial. An appointment should be made for an ABI with unna wrap placement. If she is unable to be placed on the schedule this week - she can undergo the ABI and wrap and be seen by a provider next week.

## 2019-09-24 ENCOUNTER — Other Ambulatory Visit (INDEPENDENT_AMBULATORY_CARE_PROVIDER_SITE_OTHER): Payer: Self-pay | Admitting: Vascular Surgery

## 2019-09-24 ENCOUNTER — Telehealth (INDEPENDENT_AMBULATORY_CARE_PROVIDER_SITE_OTHER): Payer: Self-pay | Admitting: Vascular Surgery

## 2019-09-24 DIAGNOSIS — U071 COVID-19: Secondary | ICD-10-CM

## 2019-09-24 DIAGNOSIS — S81801S Unspecified open wound, right lower leg, sequela: Secondary | ICD-10-CM

## 2019-09-24 HISTORY — DX: COVID-19: U07.1

## 2019-09-24 NOTE — Telephone Encounter (Signed)
First message received was a chronic wound present for over two weeks, now resolving overnight? I recommend keeping her appointment to have the wound / arterial patency appropriately assessed.

## 2019-09-24 NOTE — Telephone Encounter (Signed)
Patient has been aware with medical advice and will be coming to upcoming appointment

## 2019-09-26 ENCOUNTER — Encounter (INDEPENDENT_AMBULATORY_CARE_PROVIDER_SITE_OTHER): Payer: Self-pay

## 2019-09-26 ENCOUNTER — Ambulatory Visit (INDEPENDENT_AMBULATORY_CARE_PROVIDER_SITE_OTHER): Payer: PPO

## 2019-09-26 ENCOUNTER — Ambulatory Visit (INDEPENDENT_AMBULATORY_CARE_PROVIDER_SITE_OTHER): Payer: PPO | Admitting: Nurse Practitioner

## 2019-09-26 ENCOUNTER — Other Ambulatory Visit: Payer: Self-pay

## 2019-09-26 VITALS — BP 137/64 | HR 57 | Resp 14 | Ht 66.0 in | Wt 181.0 lb

## 2019-09-26 DIAGNOSIS — S81801S Unspecified open wound, right lower leg, sequela: Secondary | ICD-10-CM | POA: Diagnosis not present

## 2019-09-26 DIAGNOSIS — L97329 Non-pressure chronic ulcer of left ankle with unspecified severity: Secondary | ICD-10-CM | POA: Diagnosis not present

## 2019-09-26 DIAGNOSIS — I83023 Varicose veins of left lower extremity with ulcer of ankle: Secondary | ICD-10-CM

## 2019-09-26 NOTE — Progress Notes (Signed)
History of Present Illness  There is no documented history at this time  Assessments & Plan   There are no diagnoses linked to this encounter.    Additional instructions  Subjective:  Patient presents with venous ulcer of the Right lower extremity.    Procedure:  3 layer unna wrap was placed Right lower extremity.   Plan:   Follow up in one week.   

## 2019-09-29 ENCOUNTER — Encounter (INDEPENDENT_AMBULATORY_CARE_PROVIDER_SITE_OTHER): Payer: Self-pay | Admitting: Nurse Practitioner

## 2019-10-03 ENCOUNTER — Other Ambulatory Visit: Payer: Self-pay

## 2019-10-03 ENCOUNTER — Ambulatory Visit (INDEPENDENT_AMBULATORY_CARE_PROVIDER_SITE_OTHER): Payer: PPO | Admitting: Nurse Practitioner

## 2019-10-03 ENCOUNTER — Encounter (INDEPENDENT_AMBULATORY_CARE_PROVIDER_SITE_OTHER): Payer: Self-pay

## 2019-10-03 VITALS — BP 148/75 | HR 56 | Resp 16 | Wt 183.0 lb

## 2019-10-03 DIAGNOSIS — L97329 Non-pressure chronic ulcer of left ankle with unspecified severity: Secondary | ICD-10-CM

## 2019-10-03 DIAGNOSIS — I83023 Varicose veins of left lower extremity with ulcer of ankle: Secondary | ICD-10-CM

## 2019-10-03 NOTE — Progress Notes (Signed)
History of Present Illness  There is no documented history at this time  Assessments & Plan   There are no diagnoses linked to this encounter.    Additional instructions  Subjective:  Patient presents with venous ulcer of the Right lower extremity.    Procedure:  3 layer unna wrap was placed Right lower extremity.   Plan:   Follow up in one week.   

## 2019-10-04 DIAGNOSIS — M48062 Spinal stenosis, lumbar region with neurogenic claudication: Secondary | ICD-10-CM | POA: Diagnosis not present

## 2019-10-04 DIAGNOSIS — M5136 Other intervertebral disc degeneration, lumbar region: Secondary | ICD-10-CM | POA: Diagnosis not present

## 2019-10-04 DIAGNOSIS — M5416 Radiculopathy, lumbar region: Secondary | ICD-10-CM | POA: Diagnosis not present

## 2019-10-07 ENCOUNTER — Encounter (INDEPENDENT_AMBULATORY_CARE_PROVIDER_SITE_OTHER): Payer: Self-pay | Admitting: Nurse Practitioner

## 2019-10-07 DIAGNOSIS — N390 Urinary tract infection, site not specified: Secondary | ICD-10-CM | POA: Diagnosis not present

## 2019-10-07 DIAGNOSIS — Z03818 Encounter for observation for suspected exposure to other biological agents ruled out: Secondary | ICD-10-CM | POA: Diagnosis not present

## 2019-10-07 DIAGNOSIS — R6883 Chills (without fever): Secondary | ICD-10-CM | POA: Diagnosis not present

## 2019-10-07 DIAGNOSIS — R1084 Generalized abdominal pain: Secondary | ICD-10-CM | POA: Diagnosis not present

## 2019-10-07 DIAGNOSIS — R109 Unspecified abdominal pain: Secondary | ICD-10-CM | POA: Diagnosis not present

## 2019-10-07 DIAGNOSIS — R110 Nausea: Secondary | ICD-10-CM | POA: Diagnosis not present

## 2019-10-07 DIAGNOSIS — U071 COVID-19: Secondary | ICD-10-CM | POA: Diagnosis not present

## 2019-10-08 ENCOUNTER — Telehealth (INDEPENDENT_AMBULATORY_CARE_PROVIDER_SITE_OTHER): Payer: Self-pay | Admitting: Vascular Surgery

## 2019-10-08 NOTE — Telephone Encounter (Signed)
Patient was giving medical advice and verbalized understanding

## 2019-10-08 NOTE — Telephone Encounter (Signed)
Patient should utilize compression stockings on a daily basis.  She should place them in the AM and remove them in the evening.  She should elevate her legs as much as possible.  If she is unable to tolerate compression socks, having someone use ACE Bandages to wrap legs. Wounds can have triple antiobiotic ointment placed on them.   Once the patient has been 10 days from date of her positive test (Day 1 being the day after the test), she can come into the office to have wraps placed IF her symptoms are getting better and she has been fever free with no medication for at least 24 hours.  This is if she has a mild case.   If she has a moderate/severe case (requiring hospitalization with supplemental oxygen) the same time line applies, except it is 20 days instead of 10 before she can visit the office.

## 2019-10-10 ENCOUNTER — Encounter (INDEPENDENT_AMBULATORY_CARE_PROVIDER_SITE_OTHER): Payer: PPO

## 2019-10-14 DIAGNOSIS — U071 COVID-19: Secondary | ICD-10-CM | POA: Diagnosis not present

## 2019-10-15 ENCOUNTER — Ambulatory Visit (INDEPENDENT_AMBULATORY_CARE_PROVIDER_SITE_OTHER): Payer: PPO | Admitting: Nurse Practitioner

## 2019-11-08 DIAGNOSIS — I10 Essential (primary) hypertension: Secondary | ICD-10-CM | POA: Diagnosis not present

## 2019-11-08 DIAGNOSIS — R110 Nausea: Secondary | ICD-10-CM | POA: Diagnosis not present

## 2019-11-08 DIAGNOSIS — R42 Dizziness and giddiness: Secondary | ICD-10-CM | POA: Diagnosis not present

## 2019-11-08 DIAGNOSIS — K219 Gastro-esophageal reflux disease without esophagitis: Secondary | ICD-10-CM | POA: Diagnosis not present

## 2019-11-14 DIAGNOSIS — M48062 Spinal stenosis, lumbar region with neurogenic claudication: Secondary | ICD-10-CM | POA: Diagnosis not present

## 2019-11-14 DIAGNOSIS — M5136 Other intervertebral disc degeneration, lumbar region: Secondary | ICD-10-CM | POA: Diagnosis not present

## 2019-11-14 DIAGNOSIS — M5416 Radiculopathy, lumbar region: Secondary | ICD-10-CM | POA: Diagnosis not present

## 2019-12-19 ENCOUNTER — Ambulatory Visit: Payer: PPO | Attending: Internal Medicine

## 2019-12-19 DIAGNOSIS — Z23 Encounter for immunization: Secondary | ICD-10-CM | POA: Insufficient documentation

## 2019-12-19 NOTE — Progress Notes (Signed)
   Covid-19 Vaccination Clinic  Name:  CALIA ARTIM    MRN: [id number] DOB: [birth year]  12/19/2019  Ms. Tapani was observed post Covid-19 immunization for 15 minutes without incidence. She was provided with Vaccine Information Sheet and instruction to access the V-Safe system.   Ms. Fogal was instructed to call 911 with any severe reactions post vaccine: Marland Kitchen Difficulty breathing  . Swelling of your face and throat  . A fast heartbeat  . A bad rash all over your body  . Dizziness and weakness    Immunizations Administered    Name Date Dose VIS Date Route   Pfizer COVID-19 Vaccine 12/19/2019 12:51 PM 0.3 mL 10/04/2019 Intramuscular   Manufacturer: [place]   Lot: [id number]   [place]: [id number]

## 2020-01-08 ENCOUNTER — Ambulatory Visit: Payer: PPO | Attending: Internal Medicine

## 2020-01-08 DIAGNOSIS — Z23 Encounter for immunization: Secondary | ICD-10-CM

## 2020-01-08 NOTE — Progress Notes (Signed)
   Covid-19 Vaccination Clinic  Name:  Kiara Perry    MRN: [id number] DOB: [birth year]  01/08/2020  Ms. Kiara Perry was observed post Covid-19 immunization for 30 minutes based on pre-vaccination screening without incident. She was provided with Vaccine Information Sheet and instruction to access the V-Safe system.   Ms. Kiara Perry was instructed to call 911 with any severe reactions post vaccine: Marland Kitchen Difficulty breathing  . Swelling of face and throat  . A fast heartbeat  . A bad rash all over body  . Dizziness and weakness   Immunizations Administered    Name Date Dose VIS Date Route   Pfizer COVID-19 Vaccine 01/08/2020  1:44 PM 0.3 mL 10/04/2019 Intramuscular   Manufacturer: [place]   Lot: [id number]   [place]: [id number]

## 2020-01-23 DIAGNOSIS — M25562 Pain in left knee: Secondary | ICD-10-CM | POA: Diagnosis not present

## 2020-01-23 DIAGNOSIS — Z79899 Other long term (current) drug therapy: Secondary | ICD-10-CM | POA: Diagnosis not present

## 2020-01-23 DIAGNOSIS — I10 Essential (primary) hypertension: Secondary | ICD-10-CM | POA: Diagnosis not present

## 2020-01-23 DIAGNOSIS — N390 Urinary tract infection, site not specified: Secondary | ICD-10-CM | POA: Diagnosis not present

## 2020-01-23 DIAGNOSIS — E7800 Pure hypercholesterolemia, unspecified: Secondary | ICD-10-CM | POA: Diagnosis not present

## 2020-01-23 DIAGNOSIS — I2510 Atherosclerotic heart disease of native coronary artery without angina pectoris: Secondary | ICD-10-CM | POA: Diagnosis not present

## 2020-01-23 DIAGNOSIS — Z0000 Encounter for general adult medical examination without abnormal findings: Secondary | ICD-10-CM | POA: Diagnosis not present

## 2020-01-27 ENCOUNTER — Other Ambulatory Visit: Payer: Self-pay

## 2020-01-27 ENCOUNTER — Encounter (INDEPENDENT_AMBULATORY_CARE_PROVIDER_SITE_OTHER): Payer: Self-pay | Admitting: Vascular Surgery

## 2020-01-27 ENCOUNTER — Ambulatory Visit (INDEPENDENT_AMBULATORY_CARE_PROVIDER_SITE_OTHER): Payer: PPO | Admitting: Vascular Surgery

## 2020-01-27 VITALS — BP 118/65 | HR 61 | Resp 16 | Wt 177.0 lb

## 2020-01-27 DIAGNOSIS — I890 Lymphedema, not elsewhere classified: Secondary | ICD-10-CM

## 2020-01-27 DIAGNOSIS — I25118 Atherosclerotic heart disease of native coronary artery with other forms of angina pectoris: Secondary | ICD-10-CM

## 2020-01-27 DIAGNOSIS — I872 Venous insufficiency (chronic) (peripheral): Secondary | ICD-10-CM

## 2020-01-27 DIAGNOSIS — E782 Mixed hyperlipidemia: Secondary | ICD-10-CM

## 2020-01-27 NOTE — Progress Notes (Signed)
MRN : [id number]  Kiara Perry is a 85 y.o. (22-Oct-1935) female who presents with chief complaint of  Chief Complaint  Patient presents with  . Follow-up    55month follow up  .  History of Present Illness:   The patient returns to the office for followup evaluation regarding leg swelling.  The swelling has improved quite a bit and the pain associated with swelling has decreased substantially. There have not been any interval development of a ulcerations or wounds.  Since the previous visit the patient has been wearing graduated compression stockings and has noted little significant improvement in the lymphedema. The patient has been using compression routinely morning until night.  The patient also states elevation during the day and exercise is being done too.   Current Meds  Medication Sig  . bisoprolol (ZEBETA) 5 MG tablet Take 5 mg by mouth daily.  . Calcium Carbonate-Vitamin D 600-400 MG-UNIT tablet Take by mouth.  . furosemide (LASIX) 20 MG tablet Take 20 mg by mouth daily.  Marland Kitchen gabapentin (NEURONTIN) 100 MG capsule TAKE 2 CAPSULES BY MOUTH NIGHTLY  . isosorbide mononitrate (IMDUR) 30 MG 24 hr tablet Take 30 mg by mouth daily.  Marland Kitchen losartan (COZAAR) 100 MG tablet   . lovastatin (MEVACOR) 40 MG tablet TAKE 2 TABLETS (80 MG TOTAL) BY MOUTH DAILY WITH DINNER  . naproxen (NAPROSYN) 500 MG tablet Take by mouth.  Marland Kitchen omeprazole (PRILOSEC) 40 MG capsule TAKE 1 CAPSULE (40 MG TOTAL) BY MOUTH ONCE DAILY.  Marland Kitchen torsemide (DEMADEX) 20 MG tablet 1 tablet twice a day for 3 days, then 1 tablet each morning    Past Medical History:  Diagnosis Date  . Arthritis   . Hyperlipidemia   . Hypertension     Past Surgical History:  Procedure Laterality Date  . BREAST BIOPSY Left 1973   neg  . BREAST CYST ASPIRATION Left 1970   neg    Social History Social History   Tobacco Use  . Smoking status: Never Smoker  . Smokeless tobacco: Never Used  Substance Use Topics  . Alcohol use:  Never  . Drug use: Never    Family History Family History  Problem Relation Age of Onset  . Breast cancer Sister 52    Allergies  Allergen Reactions  . Levofloxacin Nausea Only     REVIEW OF SYSTEMS (Negative unless checked)  Constitutional: [] Weight loss  [] Fever  [] Chills Cardiac: [] Chest pain   [] Chest pressure   [] Palpitations   [] Shortness of breath when laying flat   [] Shortness of breath with exertion. Vascular:  [] Pain in legs with walking   [] Pain in legs at rest  [] History of DVT   [] Phlebitis   [x] Swelling in legs   [] Varicose veins   [] Non-healing ulcers Pulmonary:   [] Uses home oxygen   [] Productive cough   [] Hemoptysis   [] Wheeze  [] COPD   [] Asthma Neurologic:  [] Dizziness   [] Seizures   [] History of stroke   [] History of TIA  [] Aphasia   [] Vissual changes   [] Weakness or numbness in arm   [] Weakness or numbness in leg Musculoskeletal:   [] Joint swelling   [x] Joint pain   [] Low back pain Hematologic:  [] Easy bruising  [] Easy bleeding   [] Hypercoagulable state   [] Anemic Gastrointestinal:  [] Diarrhea   [] Vomiting  [x] Gastroesophageal reflux/heartburn   [] Difficulty swallowing. Genitourinary:  [] Chronic kidney disease   [] Difficult urination  [] Frequent urination   [] Blood in urine Skin:  [] Rashes   [] Ulcers  Psychological:  []   History of anxiety   []  History of major depression.  Physical Examination  Vitals:   01/27/20 0911  BP: 118/65  Pulse: 61  Resp: 16  Weight: 177 lb (80.3 kg)   Body mass index is 28.57 kg/m. Gen: WD/WN, NAD Head: [place]/AT, No temporalis wasting.  Ear/Nose/Throat: Hearing grossly intact, nares w/o erythema or drainage Eyes: PER, EOMI, sclera nonicteric.  Neck: Supple, no large masses.   Pulmonary:  Good air movement, no audible wheezing bilaterally, no use of accessory muscles.  Cardiac: RRR, no JVD Vascular: scattered varicosities present bilaterally.  Mild venous stasis changes to the legs bilaterally.  2+ soft pitting  edema Gastrointestinal: Non-distended. No guarding/no peritoneal signs.  Musculoskeletal: M/S 5/5 throughout.  No deformity or atrophy.  Neurologic: CN 2-12 intact. Symmetrical.  Speech is fluent. Motor exam as listed above. Psychiatric: Judgment intact, Mood & affect appropriate for pt's clinical situation. Dermatologic: No rashes or ulcers noted.  No changes consistent with cellulitis.   CBC No results found for: WBC, HGB, HCT, MCV, PLT  BMET    Component Value Date/Time   CREATININE 0.90 05/30/2018 1656   CrCl cannot be calculated (Patient's most recent lab result is older than the maximum 21 days allowed.).  COAG No results found for: INR, PROTIME  Radiology No results found.   Assessment/Plan 1. Lymphedema No surgery or intervention at this point in time.  I have reviewed my discussion with the patient regarding venous insufficiency and why it causes symptoms. I have discussed with the patient the chronic skin changes that accompany venous insufficiency and the long term sequela such as ulceration. Patient will contnue wearing graduated compression stockings on a daily basis, as this has provided excellent control of his edema. The patient will put the stockings on first thing in the morning and removing them in the evening. The patient is reminded not to sleep in the stockings.  In addition, behavioral modification including elevation during the day will be initiated. Exercise is strongly encouraged.  Given the patient's good control and lack of any problems regarding the venous insufficiency and lymphedema a lymph pump in not need at this time.  The patient will follow up with me PRN should anything change.  The patient voices agreement with this plan.   2. Venous insufficiency No surgery or intervention at this point in time.  I have reviewed my discussion with the patient regarding venous insufficiency and why it causes symptoms. I have discussed with the patient the  chronic skin changes that accompany venous insufficiency and the long term sequela such as ulceration. Patient will contnue wearing graduated compression stockings on a daily basis, as this has provided excellent control of his edema. The patient will put the stockings on first thing in the morning and removing them in the evening. The patient is reminded not to sleep in the stockings.  In addition, behavioral modification including elevation during the day will be initiated. Exercise is strongly encouraged.  Given the patient's good control and lack of any problems regarding the venous insufficiency and lymphedema a lymph pump in not need at this time.  The patient will follow up with me PRN should anything change.  The patient voices agreement with this plan.   3. Coronary artery disease of native artery of native heart with stable angina pectoris (HCC) Continue cardiac and antihypertensive medications as already ordered and reviewed, no changes at this time.  Continue statin as ordered and reviewed, no changes at this time  Nitrates PRN  for chest pain   4. Mixed hyperlipidemia Continue statin as ordered and reviewed, no changes at this time     Hortencia Pilar, MD  01/27/2020 10:08 AM

## 2020-02-20 DIAGNOSIS — R001 Bradycardia, unspecified: Secondary | ICD-10-CM | POA: Diagnosis not present

## 2020-02-20 DIAGNOSIS — I2510 Atherosclerotic heart disease of native coronary artery without angina pectoris: Secondary | ICD-10-CM | POA: Diagnosis not present

## 2020-02-20 DIAGNOSIS — R0602 Shortness of breath: Secondary | ICD-10-CM | POA: Diagnosis not present

## 2020-02-20 DIAGNOSIS — I10 Essential (primary) hypertension: Secondary | ICD-10-CM | POA: Diagnosis not present

## 2020-02-20 DIAGNOSIS — E7800 Pure hypercholesterolemia, unspecified: Secondary | ICD-10-CM | POA: Diagnosis not present

## 2020-03-03 DIAGNOSIS — M7751 Other enthesopathy of right foot: Secondary | ICD-10-CM | POA: Diagnosis not present

## 2020-03-03 DIAGNOSIS — M2041 Other hammer toe(s) (acquired), right foot: Secondary | ICD-10-CM | POA: Diagnosis not present

## 2020-03-03 DIAGNOSIS — B351 Tinea unguium: Secondary | ICD-10-CM | POA: Diagnosis not present

## 2020-03-03 DIAGNOSIS — Q66211 Congenital metatarsus primus varus, right foot: Secondary | ICD-10-CM | POA: Diagnosis not present

## 2020-03-03 DIAGNOSIS — M79671 Pain in right foot: Secondary | ICD-10-CM | POA: Diagnosis not present

## 2020-03-17 DIAGNOSIS — M7751 Other enthesopathy of right foot: Secondary | ICD-10-CM | POA: Diagnosis not present

## 2020-03-17 DIAGNOSIS — Q66211 Congenital metatarsus primus varus, right foot: Secondary | ICD-10-CM | POA: Diagnosis not present

## 2020-03-17 DIAGNOSIS — M79671 Pain in right foot: Secondary | ICD-10-CM | POA: Diagnosis not present

## 2020-03-17 DIAGNOSIS — M2041 Other hammer toe(s) (acquired), right foot: Secondary | ICD-10-CM | POA: Diagnosis not present

## 2020-03-24 DIAGNOSIS — Q66211 Congenital metatarsus primus varus, right foot: Secondary | ICD-10-CM | POA: Diagnosis not present

## 2020-03-24 DIAGNOSIS — M2041 Other hammer toe(s) (acquired), right foot: Secondary | ICD-10-CM | POA: Diagnosis not present

## 2020-03-24 DIAGNOSIS — M79671 Pain in right foot: Secondary | ICD-10-CM | POA: Diagnosis not present

## 2020-03-24 DIAGNOSIS — M7751 Other enthesopathy of right foot: Secondary | ICD-10-CM | POA: Diagnosis not present

## 2020-03-27 DIAGNOSIS — I2510 Atherosclerotic heart disease of native coronary artery without angina pectoris: Secondary | ICD-10-CM | POA: Diagnosis not present

## 2020-03-27 DIAGNOSIS — R0602 Shortness of breath: Secondary | ICD-10-CM | POA: Diagnosis not present

## 2020-04-01 DIAGNOSIS — R001 Bradycardia, unspecified: Secondary | ICD-10-CM | POA: Diagnosis not present

## 2020-04-01 DIAGNOSIS — I700 Atherosclerosis of aorta: Secondary | ICD-10-CM | POA: Diagnosis not present

## 2020-04-01 DIAGNOSIS — I2510 Atherosclerotic heart disease of native coronary artery without angina pectoris: Secondary | ICD-10-CM | POA: Diagnosis not present

## 2020-04-01 DIAGNOSIS — E7800 Pure hypercholesterolemia, unspecified: Secondary | ICD-10-CM | POA: Diagnosis not present

## 2020-04-01 DIAGNOSIS — I10 Essential (primary) hypertension: Secondary | ICD-10-CM | POA: Diagnosis not present

## 2020-04-07 DIAGNOSIS — M2041 Other hammer toe(s) (acquired), right foot: Secondary | ICD-10-CM | POA: Diagnosis not present

## 2020-04-07 DIAGNOSIS — M79671 Pain in right foot: Secondary | ICD-10-CM | POA: Diagnosis not present

## 2020-04-07 DIAGNOSIS — M7751 Other enthesopathy of right foot: Secondary | ICD-10-CM | POA: Diagnosis not present

## 2020-04-07 DIAGNOSIS — Q66211 Congenital metatarsus primus varus, right foot: Secondary | ICD-10-CM | POA: Diagnosis not present

## 2020-05-17 DIAGNOSIS — W010XXA Fall on same level from slipping, tripping and stumbling without subsequent striking against object, initial encounter: Secondary | ICD-10-CM | POA: Diagnosis not present

## 2020-05-17 DIAGNOSIS — S59902A Unspecified injury of left elbow, initial encounter: Secondary | ICD-10-CM | POA: Diagnosis not present

## 2020-05-17 DIAGNOSIS — S51012A Laceration without foreign body of left elbow, initial encounter: Secondary | ICD-10-CM | POA: Diagnosis not present

## 2020-05-17 DIAGNOSIS — M7989 Other specified soft tissue disorders: Secondary | ICD-10-CM | POA: Diagnosis not present

## 2020-06-03 DIAGNOSIS — I10 Essential (primary) hypertension: Secondary | ICD-10-CM | POA: Diagnosis not present

## 2020-06-03 DIAGNOSIS — R001 Bradycardia, unspecified: Secondary | ICD-10-CM | POA: Diagnosis not present

## 2020-06-03 DIAGNOSIS — E7800 Pure hypercholesterolemia, unspecified: Secondary | ICD-10-CM | POA: Diagnosis not present

## 2020-06-03 DIAGNOSIS — I2510 Atherosclerotic heart disease of native coronary artery without angina pectoris: Secondary | ICD-10-CM | POA: Diagnosis not present

## 2020-06-03 DIAGNOSIS — M1712 Unilateral primary osteoarthritis, left knee: Secondary | ICD-10-CM | POA: Diagnosis not present

## 2020-06-03 DIAGNOSIS — Z86711 Personal history of pulmonary embolism: Secondary | ICD-10-CM | POA: Diagnosis not present

## 2020-06-03 DIAGNOSIS — I700 Atherosclerosis of aorta: Secondary | ICD-10-CM | POA: Diagnosis not present

## 2020-06-13 DIAGNOSIS — M898X1 Other specified disorders of bone, shoulder: Secondary | ICD-10-CM | POA: Diagnosis not present

## 2020-06-13 DIAGNOSIS — M79602 Pain in left arm: Secondary | ICD-10-CM | POA: Diagnosis not present

## 2020-06-13 DIAGNOSIS — M25512 Pain in left shoulder: Secondary | ICD-10-CM | POA: Diagnosis not present

## 2020-06-15 ENCOUNTER — Other Ambulatory Visit: Payer: Self-pay | Admitting: Infectious Diseases

## 2020-06-15 ENCOUNTER — Ambulatory Visit
Admission: RE | Admit: 2020-06-15 | Discharge: 2020-06-15 | Disposition: A | Discharge status: Home / Self Care | Payer: PPO | Source: Ambulatory Visit | Attending: Infectious Diseases | Admitting: Infectious Diseases

## 2020-06-15 ENCOUNTER — Other Ambulatory Visit: Payer: Self-pay

## 2020-06-15 DIAGNOSIS — R42 Dizziness and giddiness: Secondary | ICD-10-CM

## 2020-06-15 DIAGNOSIS — M898X1 Other specified disorders of bone, shoulder: Secondary | ICD-10-CM | POA: Diagnosis not present

## 2020-06-15 DIAGNOSIS — I10 Essential (primary) hypertension: Secondary | ICD-10-CM

## 2020-06-15 DIAGNOSIS — M47812 Spondylosis without myelopathy or radiculopathy, cervical region: Secondary | ICD-10-CM | POA: Diagnosis not present

## 2020-06-15 DIAGNOSIS — M19012 Primary osteoarthritis, left shoulder: Secondary | ICD-10-CM | POA: Diagnosis not present

## 2020-06-22 DIAGNOSIS — M5442 Lumbago with sciatica, left side: Secondary | ICD-10-CM | POA: Diagnosis not present

## 2020-06-22 DIAGNOSIS — I10 Essential (primary) hypertension: Secondary | ICD-10-CM | POA: Diagnosis not present

## 2020-06-22 DIAGNOSIS — G8929 Other chronic pain: Secondary | ICD-10-CM | POA: Diagnosis not present

## 2020-06-23 DIAGNOSIS — L570 Actinic keratosis: Secondary | ICD-10-CM | POA: Diagnosis not present

## 2020-06-23 DIAGNOSIS — L821 Other seborrheic keratosis: Secondary | ICD-10-CM | POA: Diagnosis not present

## 2020-06-23 DIAGNOSIS — D225 Melanocytic nevi of trunk: Secondary | ICD-10-CM | POA: Diagnosis not present

## 2020-07-22 ENCOUNTER — Other Ambulatory Visit: Payer: Self-pay | Admitting: Internal Medicine

## 2020-07-22 DIAGNOSIS — Z1231 Encounter for screening mammogram for malignant neoplasm of breast: Secondary | ICD-10-CM

## 2020-07-31 DIAGNOSIS — Z0000 Encounter for general adult medical examination without abnormal findings: Secondary | ICD-10-CM | POA: Diagnosis not present

## 2020-07-31 DIAGNOSIS — Z1231 Encounter for screening mammogram for malignant neoplasm of breast: Secondary | ICD-10-CM | POA: Diagnosis not present

## 2020-07-31 DIAGNOSIS — G8929 Other chronic pain: Secondary | ICD-10-CM | POA: Diagnosis not present

## 2020-07-31 DIAGNOSIS — Z1211 Encounter for screening for malignant neoplasm of colon: Secondary | ICD-10-CM | POA: Diagnosis not present

## 2020-07-31 DIAGNOSIS — I700 Atherosclerosis of aorta: Secondary | ICD-10-CM | POA: Diagnosis not present

## 2020-07-31 DIAGNOSIS — I10 Essential (primary) hypertension: Secondary | ICD-10-CM | POA: Diagnosis not present

## 2020-07-31 DIAGNOSIS — Z79899 Other long term (current) drug therapy: Secondary | ICD-10-CM | POA: Diagnosis not present

## 2020-07-31 DIAGNOSIS — I890 Lymphedema, not elsewhere classified: Secondary | ICD-10-CM | POA: Diagnosis not present

## 2020-07-31 DIAGNOSIS — E7800 Pure hypercholesterolemia, unspecified: Secondary | ICD-10-CM | POA: Diagnosis not present

## 2020-07-31 DIAGNOSIS — M5442 Lumbago with sciatica, left side: Secondary | ICD-10-CM | POA: Diagnosis not present

## 2020-08-11 DIAGNOSIS — Z1211 Encounter for screening for malignant neoplasm of colon: Secondary | ICD-10-CM | POA: Diagnosis not present

## 2020-08-14 DIAGNOSIS — E875 Hyperkalemia: Secondary | ICD-10-CM | POA: Diagnosis not present

## 2020-08-14 DIAGNOSIS — E871 Hypo-osmolality and hyponatremia: Secondary | ICD-10-CM | POA: Diagnosis not present

## 2020-08-25 DIAGNOSIS — Z86711 Personal history of pulmonary embolism: Secondary | ICD-10-CM | POA: Diagnosis not present

## 2020-08-25 DIAGNOSIS — M79605 Pain in left leg: Secondary | ICD-10-CM | POA: Diagnosis not present

## 2020-08-25 DIAGNOSIS — M1712 Unilateral primary osteoarthritis, left knee: Secondary | ICD-10-CM | POA: Diagnosis not present

## 2020-08-25 DIAGNOSIS — M545 Low back pain, unspecified: Secondary | ICD-10-CM | POA: Diagnosis not present

## 2020-08-28 ENCOUNTER — Ambulatory Visit
Admission: RE | Admit: 2020-08-28 | Discharge: 2020-08-28 | Disposition: A | Discharge status: Home / Self Care | Payer: PPO | Source: Ambulatory Visit | Attending: Internal Medicine | Admitting: Internal Medicine

## 2020-08-28 ENCOUNTER — Other Ambulatory Visit: Payer: Self-pay

## 2020-08-28 DIAGNOSIS — Z1231 Encounter for screening mammogram for malignant neoplasm of breast: Secondary | ICD-10-CM | POA: Insufficient documentation

## 2020-09-21 ENCOUNTER — Other Ambulatory Visit: Payer: Self-pay

## 2020-09-21 ENCOUNTER — Encounter (INDEPENDENT_AMBULATORY_CARE_PROVIDER_SITE_OTHER): Payer: Self-pay | Admitting: Vascular Surgery

## 2020-09-21 ENCOUNTER — Ambulatory Visit (INDEPENDENT_AMBULATORY_CARE_PROVIDER_SITE_OTHER): Payer: PPO | Admitting: Vascular Surgery

## 2020-09-21 VITALS — BP 161/73 | HR 60 | Ht 66.0 in | Wt 178.0 lb

## 2020-09-21 DIAGNOSIS — I25118 Atherosclerotic heart disease of native coronary artery with other forms of angina pectoris: Secondary | ICD-10-CM

## 2020-09-21 DIAGNOSIS — M1712 Unilateral primary osteoarthritis, left knee: Secondary | ICD-10-CM

## 2020-09-21 DIAGNOSIS — Z86711 Personal history of pulmonary embolism: Secondary | ICD-10-CM | POA: Diagnosis not present

## 2020-09-21 DIAGNOSIS — I10 Essential (primary) hypertension: Secondary | ICD-10-CM

## 2020-09-21 DIAGNOSIS — I872 Venous insufficiency (chronic) (peripheral): Secondary | ICD-10-CM | POA: Diagnosis not present

## 2020-09-21 NOTE — Progress Notes (Signed)
MRN : [id number]  Kiara Perry is a 85 y.o. ([birth year]) female who presents with chief complaint of  Chief Complaint  Patient presents with  . Follow-up    [facility]. Discuss IVC filter placement   .  History of Present Illness:   The patient presents to the office for evaluation of past DVT and PE in association with DJD requiring left knee joint replacement surgery.  PE was identified years ago and was treated with anticoagulation.  The presenting symptoms were pain in the right upper chest associated with shortness of breath.  The patient notes her leg continues to swell with dependency.  Symptoms are much better with elevation.  The patient notes minimal edema in the morning which steadily worsens throughout the day.    The patient has been using compression therapy routinely since this episode.  She is followed with me in the past for venous insufficiency and lymphedema.  At the present time she notes that her swelling has been under very good control.  No recent episodes of SOB or pleuritic chest pains.  No cough or hemoptysis.  No blood per rectum or blood in any sputum.  No excessive bruising per the patient.     No outpatient medications have been marked as taking for the 09/21/20 encounter (Office Visit) with Delana Meyer, Dolores Lory, MD.    Past Medical History:  Diagnosis Date  . Arthritis   . Hyperlipidemia   . Hypertension     Past Surgical History:  Procedure Laterality Date  . BREAST BIOPSY Left 1973   neg  . BREAST CYST ASPIRATION Left 1970   neg    Social History Social History   Tobacco Use  . Smoking status: Never Smoker  . Smokeless tobacco: Never Used  Substance Use Topics  . Alcohol use: Never  . Drug use: Never    Family History Family History  Problem Relation Age of Onset  . Breast cancer Sister 25    Allergies  Allergen Reactions  . Levofloxacin Nausea Only     REVIEW OF SYSTEMS (Negative unless checked)  Constitutional:  [] Weight loss  [] Fever  [] Chills Cardiac: [] Chest pain   [] Chest pressure   [] Palpitations   [] Shortness of breath when laying flat   [] Shortness of breath with exertion. Vascular:  [] Pain in legs with walking   [x] Pain in legs at rest  [x] History of DVT   [] Phlebitis   [x] Swelling in legs   [] Varicose veins   [] Non-healing ulcers Pulmonary:   [] Uses home oxygen   [] Productive cough   [] Hemoptysis   [] Wheeze  [] COPD   [] Asthma Neurologic:  [] Dizziness   [] Seizures   [] History of stroke   [] History of TIA  [] Aphasia   [] Vissual changes   [] Weakness or numbness in arm   [] Weakness or numbness in leg Musculoskeletal:   [] Joint swelling   [x] Joint pain   [] Low back pain Hematologic:  [] Easy bruising  [] Easy bleeding   [] Hypercoagulable state   [] Anemic Gastrointestinal:  [] Diarrhea   [] Vomiting  [x] Gastroesophageal reflux/heartburn   [] Difficulty swallowing. Genitourinary:  [] Chronic kidney disease   [] Difficult urination  [] Frequent urination   [] Blood in urine Skin:  [x] Rashes   [] Ulcers  Psychological:  [] History of anxiety   []  History of major depression.  Physical Examination  Vitals:   09/21/20 1010  BP: (!) 161/73  Pulse: 60  Weight: 178 lb (80.7 kg)  Height: 5\' 6"  (1.676 m)   Body mass index is 28.73 kg/m. Gen: WD/WN, NAD  Head: [place]/AT, No temporalis wasting.  Ear/Nose/Throat: Hearing grossly intact, nares w/o erythema or drainage Eyes: PER, EOMI, sclera nonicteric.  Neck: Supple, no large masses.   Pulmonary:  Good air movement, no audible wheezing bilaterally, no use of accessory muscles.  Cardiac: RRR, no JVD Vascular: scattered varicosities present bilaterally.  Moderate venous stasis changes to the legs bilaterally. 2-3+ soft pitting edema Vessel Right Left  Radial Palpable Palpable  PT Palpable Palpable  DP Palpable Palpable  Gastrointestinal: Non-distended. No guarding/no peritoneal signs.  Musculoskeletal: M/S 5/5 throughout.  Arthritic deformity both knees left greater  than right.  Neurologic: CN 2-12 intact. Symmetrical.  Speech is fluent. Motor exam as listed above. Psychiatric: Judgment intact, Mood & affect appropriate for pt's clinical situation. Dermatologic: Venous rashes no ulcers noted.  No changes consistent with cellulitis. Lymph : No lichenification or skin changes of chronic lymphedema.  CBC No results found for: WBC, HGB, HCT, MCV, PLT  BMET    Component Value Date/Time   CREATININE 0.90 05/30/2018 1656   CrCl cannot be calculated (Patient's most recent lab result is older than the maximum 21 days allowed.).  COAG No results found for: INR, PROTIME  Radiology MM 3D SCREEN BREAST BILATERAL  Result Date: 08/31/2020 CLINICAL DATA:  Screening. EXAM: DIGITAL SCREENING BILATERAL MAMMOGRAM WITH TOMO AND CAD COMPARISON:  Previous exam(s). ACR Breast Density Category d: The breast tissue is extremely dense, which lowers the sensitivity of mammography FINDINGS: There are no findings suspicious for malignancy. Images were processed with CAD. IMPRESSION: No mammographic evidence of malignancy. A result letter of this screening mammogram will be mailed directly to the patient. RECOMMENDATION: Screening mammogram in one year. (Code:SM-B-01Y) BI-RADS CATEGORY  1: Negative. Electronically Signed   By: Evangeline Dakin M.D.   On: 08/31/2020 13:49     Assessment/Plan 1. History of pulmonary embolus (PE) The patient will continue anticoagulation for now as there have not been any problems or complications at this point.  IVC filter is strongly indicated prior to high risk orthopedic surgery.  Especially given the history of PE with the past DVT.  IVC filter placement will be done the week for surgery.  Patient is still undergoing cardiac clearance and has not yet set a date for the left total knee replacement.  I have instructed her to call the office when she does have a date so that we can plan appropriately for her filter placement.  Risk and benefits  were reviewed the patient.  Indications for the procedure were reviewed.  All questions were answered, the patient agrees to proceed.   I have had a long discussion with the patient regarding DVT and post phlebitic changes such as swelling and why it  causes symptoms such as pain.  The patient will wear graduated compression stockings class 1 (20-30 mmHg) on a daily basis a prescription was given. The patient will  beginning wearing the stockings first thing in the morning and removing them in the evening. The patient is instructed specifically not to sleep in the stockings.  In addition, behavioral modification including elevation during the day and avoidance of prolonged dependency will be initiated.    The patient will follow-up with me in 3 months after the joint replacement surgery to discuss removal (this was also discussed today and the patient agrees with the plan to have the filter removed).    2. Venous insufficiency  No surgery or intervention at this point in time.  I have stressed to the patient the importance  of optimal control of her lymphedema prior to surgery.  I have urged her to continue to wear her compression stockings routinely and to elevate periodically throughout the day.  I have reviewed my previous discussion with the patient regarding swelling and why it  causes symptoms.  The patient is doing well with compression and will continue wearing graduated compression stockings class 1 (20-30 mmHg) on a daily basis a prescription was given. The patient will  continue wearing the stockings first thing in the morning and removing them in the evening. The patient is instructed specifically not to sleep in the stockings.    In addition, behavioral modification including elevation during the day and exercise will be continued.    Patient should follow-up on an annual basis   3. Primary osteoarthritis of left knee Patient will undergo left total knee replacement in the near  future  4. Coronary artery disease of native artery of native heart with stable angina pectoris (HCC) Continue cardiac and antihypertensive medications as already ordered and reviewed, no changes at this time.  Continue statin as ordered and reviewed, no changes at this time  Nitrates PRN for chest pain   5. Primary hypertension Continue antihypertensive medications as already ordered, these medications have been reviewed and there are no changes at this time.     Hortencia Pilar, MD  09/21/2020 10:13 AM

## 2020-09-21 NOTE — H&P (View-Only) (Signed)
MRN : [id number]  Kiara Perry is a 85 y.o. (1935/09/18) female who presents with chief complaint of  Chief Complaint  Patient presents with  . Follow-up    [facility]. Discuss IVC filter placement   .  History of Present Illness:   The patient presents to the office for evaluation of past DVT and PE in association with DJD requiring left knee joint replacement surgery.  PE was identified years ago and was treated with anticoagulation.  The presenting symptoms were pain in the right upper chest associated with shortness of breath.  The patient notes her leg continues to swell with dependency.  Symptoms are much better with elevation.  The patient notes minimal edema in the morning which steadily worsens throughout the day.    The patient has been using compression therapy routinely since this episode.  She is followed with me in the past for venous insufficiency and lymphedema.  At the present time she notes that her swelling has been under very good control.  No recent episodes of SOB or pleuritic chest pains.  No cough or hemoptysis.  No blood per rectum or blood in any sputum.  No excessive bruising per the patient.     No outpatient medications have been marked as taking for the 09/21/20 encounter (Office Visit) with Delana Meyer, Dolores Lory, MD.    Past Medical History:  Diagnosis Date  . Arthritis   . Hyperlipidemia   . Hypertension     Past Surgical History:  Procedure Laterality Date  . BREAST BIOPSY Left 1973   neg  . BREAST CYST ASPIRATION Left 1970   neg    Social History Social History   Tobacco Use  . Smoking status: Never Smoker  . Smokeless tobacco: Never Used  Substance Use Topics  . Alcohol use: Never  . Drug use: Never    Family History Family History  Problem Relation Age of Onset  . Breast cancer Sister 29    Allergies  Allergen Reactions  . Levofloxacin Nausea Only     REVIEW OF SYSTEMS (Negative unless checked)  Constitutional:  [] Weight loss  [] Fever  [] Chills Cardiac: [] Chest pain   [] Chest pressure   [] Palpitations   [] Shortness of breath when laying flat   [] Shortness of breath with exertion. Vascular:  [] Pain in legs with walking   [x] Pain in legs at rest  [x] History of DVT   [] Phlebitis   [x] Swelling in legs   [] Varicose veins   [] Non-healing ulcers Pulmonary:   [] Uses home oxygen   [] Productive cough   [] Hemoptysis   [] Wheeze  [] COPD   [] Asthma Neurologic:  [] Dizziness   [] Seizures   [] History of stroke   [] History of TIA  [] Aphasia   [] Vissual changes   [] Weakness or numbness in arm   [] Weakness or numbness in leg Musculoskeletal:   [] Joint swelling   [x] Joint pain   [] Low back pain Hematologic:  [] Easy bruising  [] Easy bleeding   [] Hypercoagulable state   [] Anemic Gastrointestinal:  [] Diarrhea   [] Vomiting  [x] Gastroesophageal reflux/heartburn   [] Difficulty swallowing. Genitourinary:  [] Chronic kidney disease   [] Difficult urination  [] Frequent urination   [] Blood in urine Skin:  [x] Rashes   [] Ulcers  Psychological:  [] History of anxiety   []  History of major depression.  Physical Examination  Vitals:   09/21/20 1010  BP: (!) 161/73  Pulse: 60  Weight: 178 lb (80.7 kg)  Height: 5\' 6"  (1.676 m)   Body mass index is 28.73 kg/m. Gen: WD/WN, NAD  Head: [place]/AT, No temporalis wasting.  Ear/Nose/Throat: Hearing grossly intact, nares w/o erythema or drainage Eyes: PER, EOMI, sclera nonicteric.  Neck: Supple, no large masses.   Pulmonary:  Good air movement, no audible wheezing bilaterally, no use of accessory muscles.  Cardiac: RRR, no JVD Vascular: scattered varicosities present bilaterally.  Moderate venous stasis changes to the legs bilaterally. 2-3+ soft pitting edema Vessel Right Left  Radial Palpable Palpable  PT Palpable Palpable  DP Palpable Palpable  Gastrointestinal: Non-distended. No guarding/no peritoneal signs.  Musculoskeletal: M/S 5/5 throughout.  Arthritic deformity both knees left greater  than right.  Neurologic: CN 2-12 intact. Symmetrical.  Speech is fluent. Motor exam as listed above. Psychiatric: Judgment intact, Mood & affect appropriate for pt's clinical situation. Dermatologic: Venous rashes no ulcers noted.  No changes consistent with cellulitis. Lymph : No lichenification or skin changes of chronic lymphedema.  CBC No results found for: WBC, HGB, HCT, MCV, PLT  BMET    Component Value Date/Time   CREATININE 0.90 05/30/2018 1656   CrCl cannot be calculated (Patient's most recent lab result is older than the maximum 21 days allowed.).  COAG No results found for: INR, PROTIME  Radiology MM 3D SCREEN BREAST BILATERAL  Result Date: 08/31/2020 CLINICAL DATA:  Screening. EXAM: DIGITAL SCREENING BILATERAL MAMMOGRAM WITH TOMO AND CAD COMPARISON:  Previous exam(s). ACR Breast Density Category d: The breast tissue is extremely dense, which lowers the sensitivity of mammography FINDINGS: There are no findings suspicious for malignancy. Images were processed with CAD. IMPRESSION: No mammographic evidence of malignancy. A result letter of this screening mammogram will be mailed directly to the patient. RECOMMENDATION: Screening mammogram in one year. (Code:SM-B-01Y) BI-RADS CATEGORY  1: Negative. Electronically Signed   By: Evangeline Dakin M.D.   On: 08/31/2020 13:49     Assessment/Plan 1. History of pulmonary embolus (PE) The patient will continue anticoagulation for now as there have not been any problems or complications at this point.  IVC filter is strongly indicated prior to high risk orthopedic surgery.  Especially given the history of PE with the past DVT.  IVC filter placement will be done the week for surgery.  Patient is still undergoing cardiac clearance and has not yet set a date for the left total knee replacement.  I have instructed her to call the office when she does have a date so that we can plan appropriately for her filter placement.  Risk and benefits  were reviewed the patient.  Indications for the procedure were reviewed.  All questions were answered, the patient agrees to proceed.   I have had a long discussion with the patient regarding DVT and post phlebitic changes such as swelling and why it  causes symptoms such as pain.  The patient will wear graduated compression stockings class 1 (20-30 mmHg) on a daily basis a prescription was given. The patient will  beginning wearing the stockings first thing in the morning and removing them in the evening. The patient is instructed specifically not to sleep in the stockings.  In addition, behavioral modification including elevation during the day and avoidance of prolonged dependency will be initiated.    The patient will follow-up with me in 3 months after the joint replacement surgery to discuss removal (this was also discussed today and the patient agrees with the plan to have the filter removed).    2. Venous insufficiency  No surgery or intervention at this point in time.  I have stressed to the patient the importance  of optimal control of her lymphedema prior to surgery.  I have urged her to continue to wear her compression stockings routinely and to elevate periodically throughout the day.  I have reviewed my previous discussion with the patient regarding swelling and why it  causes symptoms.  The patient is doing well with compression and will continue wearing graduated compression stockings class 1 (20-30 mmHg) on a daily basis a prescription was given. The patient will  continue wearing the stockings first thing in the morning and removing them in the evening. The patient is instructed specifically not to sleep in the stockings.    In addition, behavioral modification including elevation during the day and exercise will be continued.    Patient should follow-up on an annual basis   3. Primary osteoarthritis of left knee Patient will undergo left total knee replacement in the near  future  4. Coronary artery disease of native artery of native heart with stable angina pectoris (HCC) Continue cardiac and antihypertensive medications as already ordered and reviewed, no changes at this time.  Continue statin as ordered and reviewed, no changes at this time  Nitrates PRN for chest pain   5. Primary hypertension Continue antihypertensive medications as already ordered, these medications have been reviewed and there are no changes at this time.     Hortencia Pilar, MD  09/21/2020 10:13 AM

## 2020-09-22 ENCOUNTER — Encounter (INDEPENDENT_AMBULATORY_CARE_PROVIDER_SITE_OTHER): Payer: Self-pay | Admitting: Vascular Surgery

## 2020-09-22 DIAGNOSIS — Z86711 Personal history of pulmonary embolism: Secondary | ICD-10-CM | POA: Insufficient documentation

## 2020-10-07 ENCOUNTER — Telehealth (INDEPENDENT_AMBULATORY_CARE_PROVIDER_SITE_OTHER): Payer: Self-pay

## 2020-10-07 NOTE — Telephone Encounter (Signed)
Spoke with the patient and she is scheduled with Dr. Delana Meyer for a IVC filter placement on 10/13/20 with a 11:30 am arrival time to the MM. Covid testing on 10/09/20 between 8-1 pm at the [facility]. Pre-procedure instructions were discussed and will be mailed.

## 2020-10-08 DIAGNOSIS — E7800 Pure hypercholesterolemia, unspecified: Secondary | ICD-10-CM | POA: Diagnosis not present

## 2020-10-08 DIAGNOSIS — I2584 Coronary atherosclerosis due to calcified coronary lesion: Secondary | ICD-10-CM | POA: Diagnosis not present

## 2020-10-08 DIAGNOSIS — I10 Essential (primary) hypertension: Secondary | ICD-10-CM | POA: Diagnosis not present

## 2020-10-08 DIAGNOSIS — I2510 Atherosclerotic heart disease of native coronary artery without angina pectoris: Secondary | ICD-10-CM | POA: Diagnosis not present

## 2020-10-08 DIAGNOSIS — I700 Atherosclerosis of aorta: Secondary | ICD-10-CM | POA: Diagnosis not present

## 2020-10-08 DIAGNOSIS — R600 Localized edema: Secondary | ICD-10-CM | POA: Diagnosis not present

## 2020-10-09 ENCOUNTER — Other Ambulatory Visit: Payer: Self-pay

## 2020-10-09 ENCOUNTER — Other Ambulatory Visit
Admission: RE | Admit: 2020-10-09 | Discharge: 2020-10-09 | Disposition: A | Discharge status: Home / Self Care | Payer: PPO | Source: Ambulatory Visit | Attending: Vascular Surgery | Admitting: Vascular Surgery

## 2020-10-09 DIAGNOSIS — Z20822 Contact with and (suspected) exposure to covid-19: Secondary | ICD-10-CM | POA: Diagnosis not present

## 2020-10-09 DIAGNOSIS — Z01812 Encounter for preprocedural laboratory examination: Secondary | ICD-10-CM | POA: Diagnosis not present

## 2020-10-09 LAB — SARS CORONAVIRUS 2 (TAT 6-24 HRS): SARS Coronavirus 2: NEGATIVE

## 2020-10-13 ENCOUNTER — Encounter
Admission: RE | Disposition: A | Discharge status: Home / Self Care | Payer: Self-pay | Source: Home / Self Care | Attending: Vascular Surgery

## 2020-10-13 ENCOUNTER — Other Ambulatory Visit (INDEPENDENT_AMBULATORY_CARE_PROVIDER_SITE_OTHER): Payer: Self-pay | Admitting: Nurse Practitioner

## 2020-10-13 ENCOUNTER — Other Ambulatory Visit: Payer: Self-pay

## 2020-10-13 ENCOUNTER — Encounter: Payer: Self-pay | Admitting: Vascular Surgery

## 2020-10-13 ENCOUNTER — Ambulatory Visit
Admission: RE | Admit: 2020-10-13 | Discharge: 2020-10-13 | Disposition: A | Discharge status: Home / Self Care | Payer: PPO | Attending: Vascular Surgery | Admitting: Vascular Surgery

## 2020-10-13 DIAGNOSIS — M1712 Unilateral primary osteoarthritis, left knee: Secondary | ICD-10-CM | POA: Diagnosis not present

## 2020-10-13 DIAGNOSIS — I82409 Acute embolism and thrombosis of unspecified deep veins of unspecified lower extremity: Secondary | ICD-10-CM

## 2020-10-13 DIAGNOSIS — I872 Venous insufficiency (chronic) (peripheral): Secondary | ICD-10-CM | POA: Diagnosis not present

## 2020-10-13 DIAGNOSIS — I25118 Atherosclerotic heart disease of native coronary artery with other forms of angina pectoris: Secondary | ICD-10-CM | POA: Insufficient documentation

## 2020-10-13 DIAGNOSIS — Z86711 Personal history of pulmonary embolism: Secondary | ICD-10-CM | POA: Insufficient documentation

## 2020-10-13 DIAGNOSIS — I10 Essential (primary) hypertension: Secondary | ICD-10-CM | POA: Diagnosis not present

## 2020-10-13 DIAGNOSIS — Z86718 Personal history of other venous thrombosis and embolism: Secondary | ICD-10-CM | POA: Diagnosis not present

## 2020-10-13 HISTORY — PX: IVC FILTER INSERTION: CATH118245

## 2020-10-13 SURGERY — IVC FILTER INSERTION
Anesthesia: Moderate Sedation

## 2020-10-13 MED ORDER — MIDAZOLAM HCL 2 MG/2ML IJ SOLN
INTRAMUSCULAR | Status: DC | PRN
  Administered 2020-10-13: 2 mg via INTRAVENOUS

## 2020-10-13 MED ORDER — HYDROMORPHONE HCL 1 MG/ML IJ SOLN
1.0000 mg | Freq: Once | INTRAMUSCULAR | Status: DC | PRN
Start: 2020-10-13 — End: 2020-10-13

## 2020-10-13 MED ORDER — FENTANYL CITRATE (PF) 100 MCG/2ML IJ SOLN
INTRAMUSCULAR | Status: AC
  Filled 2020-10-13: qty 2

## 2020-10-13 MED ORDER — SODIUM CHLORIDE 0.9 % IV SOLN: INTRAVENOUS | Status: DC

## 2020-10-13 MED ORDER — DIPHENHYDRAMINE HCL 50 MG/ML IJ SOLN: 50.0000 mg | Freq: Once | INTRAMUSCULAR | Status: DC | PRN

## 2020-10-13 MED ORDER — METHYLPREDNISOLONE SODIUM SUCC 125 MG IJ SOLR: 125.0000 mg | Freq: Once | INTRAMUSCULAR | Status: DC | PRN

## 2020-10-13 MED ORDER — MIDAZOLAM HCL 5 MG/5ML IJ SOLN
INTRAMUSCULAR | Status: AC
  Filled 2020-10-13: qty 5

## 2020-10-13 MED ORDER — FAMOTIDINE 20 MG PO TABS: 40.0000 mg | ORAL_TABLET | Freq: Once | ORAL | Status: DC | PRN

## 2020-10-13 MED ORDER — IODIXANOL 320 MG/ML IV SOLN
INTRAVENOUS | Status: DC | PRN
  Administered 2020-10-13: 13:00:00 15 mL

## 2020-10-13 MED ORDER — ONDANSETRON HCL 4 MG/2ML IJ SOLN: 4.0000 mg | Freq: Four times a day (QID) | INTRAMUSCULAR | Status: DC | PRN

## 2020-10-13 MED ORDER — FENTANYL CITRATE (PF) 100 MCG/2ML IJ SOLN
INTRAMUSCULAR | Status: DC | PRN
  Administered 2020-10-13: 50 ug via INTRAVENOUS

## 2020-10-13 MED ORDER — CEFAZOLIN SODIUM-DEXTROSE 2-4 GM/100ML-% IV SOLN: 2.0000 g | Freq: Once | INTRAVENOUS | Status: DC

## 2020-10-13 MED ORDER — MIDAZOLAM HCL 2 MG/ML PO SYRP: 8.0000 mg | ORAL_SOLUTION | Freq: Once | ORAL | Status: DC | PRN

## 2020-10-13 SURGICAL SUPPLY — 5 items
KIT FEMORAL DEL DENALI (Miscellaneous) ×3 IMPLANT
NEEDLE ENTRY 21GA 7CM ECHOTIP (NEEDLE) ×3 IMPLANT
PACK ANGIOGRAPHY (CUSTOM PROCEDURE TRAY) ×3 IMPLANT
SET INTRO CAPELLA COAXIAL (SET/KITS/TRAYS/PACK) ×3 IMPLANT
WIRE GUIDERIGHT .035X150 (WIRE) ×3 IMPLANT

## 2020-10-13 NOTE — Op Note (Signed)
[facility]   OPERATIVE NOTE    PRE-OPERATIVE DIAGNOSIS: DVT with PE; degenerative joint disease requiring joint replacement surgery  POST-OPERATIVE DIAGNOSIS: Same  PROCEDURE: 1.   Ultrasound guidance for vascular access to the right common femoral vein 2.   Catheter placement into the inferior vena cava 3.   Inferior venacavogram 4.   Placement of a Denali IVC filter  SURGEON: Hortencia Pilar  ASSISTANT(S): None  ANESTHESIA: Conscious sedation was administered by the interventional radiology RN under my direct supervision. IV Versed plus fentanyl were utilized. Continuous ECG, pulse oximetry and blood pressure was monitored throughout the entire procedure. Conscious sedation was for a total of 9 minutes and 57 seconds.  ESTIMATED BLOOD LOSS: minimal  FINDING(S): 1.  Patent IVC  SPECIMEN(S):  none  INDICATIONS:   Kiara Perry is a 85 y.o. y.o. female who presents with history of DVT PE she is scheduled to undergo joint replacement surgery next week.  Inferior vena cava filter is indicated for this reason.  Risks and benefits including filter thrombosis, migration, fracture, bleeding, and infection were all discussed.  We discussed that all IVC filters that we place can be removed if desired from the patient once the need for the filter has passed.    DESCRIPTION: After obtaining full informed written consent, the patient was brought back to the vascular suite. The skin was sterilely prepped and draped in a sterile surgical field was created. Ultrasound was placed in a sterile sleeve. The right common femoral vein was echolucent and compressible indicating patency. Image was recorded for the permanent record. The puncture was made under continuous real-time ultrasound guidance.  The right common femoral vein was accessed under direct ultrasound guidance without difficulty with a micropuncture needle. Microwire was then advanced under fluoroscopic  guidance without difficulty. Micro-sheath was then inserted and a J-wire was then placed. The dilator is passed over the wire and the delivery sheath was placed into the inferior vena cava.  Inferior venacavogram was performed. This demonstrated a patent IVC with the level of the renal veins at L2.  The filter was then deployed into the inferior vena cava at the level of L3 just below the renal veins. The delivery sheath was then removed. Pressure was held. Sterile dressings were placed. The patient tolerated the procedure well and was taken to the recovery room in stable condition.  Interpretation: Inferior vena cava was opacified with a bolus ejection contrast.  Reflux of contrast into the renal veins is noted at L2 level.  Vena cava measures 22 mm in diameter.  Denali filter is deployed at the L3 level in excellent orientation.  COMPLICATIONS: None  CONDITION: Stable  Hortencia Pilar  10/13/2020, 1:24 PM

## 2020-10-13 NOTE — Progress Notes (Signed)
Patient awake/alert x4. Lungs CTA  Heart sounds regular.  NPO since midnight. Verbalizes understanding procedure. No c/o's

## 2020-10-13 NOTE — Interval H&P Note (Signed)
History and Physical Interval Note:  10/13/2020 1:09 PM  Kiara Perry  has presented today for surgery, with the diagnosis of IVC Filter Placement   DVT   Pt to have Covid test on 12-17.  The various methods of treatment have been discussed with the patient and family. After consideration of risks, benefits and other options for treatment, the patient has consented to  Procedure(s): IVC FILTER INSERTION (N/A) as a surgical intervention.  The patient's history has been reviewed, patient examined, no change in status, stable for surgery.  I have reviewed the patient's chart and labs.  Questions were answered to the patient's satisfaction.     Hortencia Pilar

## 2020-10-14 ENCOUNTER — Encounter: Payer: Self-pay | Admitting: Vascular Surgery

## 2020-10-25 NOTE — Discharge Instructions (Signed)
Instructions after Total Knee Replacement   Kiara Perry, Jr., M.D.     Dept. of Orthopaedics & Sports Medicine  [facility]  [address]  [place], [place]  [address]  Phone: [phone]   Fax: [phone]    DIET: Drink plenty of non-alcoholic fluids. Resume your normal diet. Include foods high in fiber.  ACTIVITY:  You may use crutches or a walker with weight-bearing as tolerated, unless instructed otherwise. You may be weaned off of the walker or crutches by your Physical Therapist.  Do NOT place pillows under the knee. Anything placed under the knee could limit your ability to straighten the knee.   Continue doing gentle exercises. Exercising will reduce the pain and swelling, increase motion, and prevent muscle weakness.   Please continue to use the TED compression stockings for 6 weeks. You may remove the stockings at night, but should reapply them in the morning. Do not drive or operate any equipment until instructed.  WOUND CARE:  Continue to use the PolarCare or ice packs periodically to reduce pain and swelling. You may bathe or shower after the staples are removed at the first office visit following surgery.  MEDICATIONS: You may resume your regular medications. Please take the pain medication as prescribed on the medication. Do not take pain medication on an empty stomach. You have been given a prescription for a blood thinner (Lovenox or Coumadin). Please take the medication as instructed. (NOTE: After completing a 2 week course of Lovenox, take one Enteric-coated aspirin once a day. This along with elevation will help reduce the possibility of phlebitis in your operated leg.) Do not drive or drink alcoholic beverages when taking pain medications.  CALL THE OFFICE FOR: Temperature above 101 degrees Excessive bleeding or drainage on the dressing. Excessive swelling, coldness, or paleness of the toes. Persistent nausea and vomiting.  FOLLOW-UP:  You  should have an appointment to return to the office in 10-14 days after surgery. Arrangements have been made for continuation of Physical Therapy (either home therapy or outpatient therapy).   [facility] Department Directory         [email or web address]       [email or web address]          Cardiology  Appointments: [place] - [phone] [place] - [phone]  Endocrinology  Appointments: [place] - [phone] [place] - [phone]  Gastroenterology  Appointments: [place] - [phone] [place] - [phone]        General Surgery   Appointments: [place] - [phone]  Internal Medicine/Family Medicine  Appointments: [place] - [phone] [place] - [phone] [place] - [phone]  Metabolic and Weigh Loss Surgery  Appointments: [place] - [phone]        Neurology  Appointments: [place] - [phone] [place] - [phone]  Neurosurgery  Appointments: [place] - [phone]  Obstetrics & Gynecology  Appointments: [place] - [phone] [place] - [phone]        Pediatrics  Appointments: [place] - [phone] [place] - [phone]  Physiatry  Appointments: [place] -[phone]  Physical Therapy  Appointments: [place] - [phone] [place] - [phone]        Podiatry  Appointments: [place] - [phone] [place] - [phone]  Pulmonology  Appointments: [place] - [phone]  Rheumatology  Appointments: [place] - [phone]        [place] Location: [facility]  [address] [place], [place]  [address]  [place] Location: [facility] [address] [place], [place]  [address]  [place] Location: [facility] [address] [place], [place]  [address]    

## 2020-11-06 DIAGNOSIS — I10 Essential (primary) hypertension: Secondary | ICD-10-CM | POA: Diagnosis not present

## 2020-11-06 DIAGNOSIS — Z79899 Other long term (current) drug therapy: Secondary | ICD-10-CM | POA: Diagnosis not present

## 2020-11-06 DIAGNOSIS — I2510 Atherosclerotic heart disease of native coronary artery without angina pectoris: Secondary | ICD-10-CM | POA: Diagnosis not present

## 2020-11-06 DIAGNOSIS — E7800 Pure hypercholesterolemia, unspecified: Secondary | ICD-10-CM | POA: Diagnosis not present

## 2020-11-06 DIAGNOSIS — I890 Lymphedema, not elsewhere classified: Secondary | ICD-10-CM | POA: Diagnosis not present

## 2020-11-06 DIAGNOSIS — M1712 Unilateral primary osteoarthritis, left knee: Secondary | ICD-10-CM | POA: Diagnosis not present

## 2020-11-16 ENCOUNTER — Ambulatory Visit (INDEPENDENT_AMBULATORY_CARE_PROVIDER_SITE_OTHER): Payer: PPO | Admitting: Vascular Surgery

## 2020-11-16 ENCOUNTER — Encounter (INDEPENDENT_AMBULATORY_CARE_PROVIDER_SITE_OTHER): Payer: Self-pay | Admitting: Vascular Surgery

## 2020-11-16 ENCOUNTER — Other Ambulatory Visit: Payer: Self-pay

## 2020-11-16 VITALS — BP 124/68 | HR 69 | Ht 66.0 in | Wt 179.0 lb

## 2020-11-16 DIAGNOSIS — I872 Venous insufficiency (chronic) (peripheral): Secondary | ICD-10-CM | POA: Diagnosis not present

## 2020-11-16 DIAGNOSIS — I10 Essential (primary) hypertension: Secondary | ICD-10-CM | POA: Diagnosis not present

## 2020-11-16 DIAGNOSIS — M8949 Other hypertrophic osteoarthropathy, multiple sites: Secondary | ICD-10-CM

## 2020-11-16 DIAGNOSIS — I25118 Atherosclerotic heart disease of native coronary artery with other forms of angina pectoris: Secondary | ICD-10-CM | POA: Diagnosis not present

## 2020-11-16 DIAGNOSIS — Z86711 Personal history of pulmonary embolism: Secondary | ICD-10-CM

## 2020-11-16 DIAGNOSIS — M159 Polyosteoarthritis, unspecified: Secondary | ICD-10-CM

## 2020-11-16 NOTE — Progress Notes (Signed)
MRN : [id number]  Kiara Perry is a 86 y.o. ([birth year]) female who presents with chief complaint of  Chief Complaint  Patient presents with  . Follow-up    1 mo no studies  .  History of Present Illness:   The patient presents to the office for evaluation of past DVT and PE in association with DJD requiring left knee joint replacement surgery.    In preparation for her total knee she underwent placement of an IVC filter on 10/13/2020.  Unfortunately, her surgery has been delayed secondary to the Covid crisis.  PE was identified years ago and was treated with anticoagulation.  The presenting symptoms were pain in the right upper chest associated with shortness of breath.  The patient notes her leg continues to swell with dependency.  Symptoms are much better with elevation.  The patient notes minimal edema in the morning which steadily worsens throughout the day.    The patient has been using compression therapy routinely since this episode.  She is followed with me in the past for venous insufficiency and lymphedema.  At the present time she notes that her swelling has been under very good control.  No recent episodes of SOB or pleuritic chest pains.  No cough or hemoptysis.  No blood per rectum or blood in any sputum.  No excessive bruising per the patient.   Current Meds  Medication Sig  . aspirin EC 81 MG tablet Take 81 mg by mouth daily. Swallow whole.  . bisoprolol (ZEBETA) 5 MG tablet Take 5 mg by mouth daily.  . Calcium Carbonate-Vitamin D 600-400 MG-UNIT tablet 1 tablet daily.  Marland Kitchen gabapentin (NEURONTIN) 100 MG capsule Take 100 mg by mouth at bedtime.  . hydrALAZINE (APRESOLINE) 25 MG tablet Take 25 mg by mouth at bedtime.  . lovastatin (MEVACOR) 40 MG tablet Take 80 mg by mouth at bedtime.  Marland Kitchen omeprazole (PRILOSEC) 40 MG capsule Take 40 mg by mouth daily.  Marland Kitchen telmisartan (MICARDIS) 40 MG tablet Take 40 mg by mouth daily.  Marland Kitchen torsemide (DEMADEX) 20 MG tablet Take 20  mg by mouth daily.  . traMADol (ULTRAM) 50 MG tablet Take 50 mg by mouth every 6 (six) hours as needed. for pain    Past Medical History:  Diagnosis Date  . Arthritis   . Hyperlipidemia   . Hypertension     Past Surgical History:  Procedure Laterality Date  . BREAST BIOPSY Left 1973   neg  . BREAST CYST ASPIRATION Left 1970   neg  . IVC FILTER INSERTION N/A 10/13/2020   Procedure: IVC FILTER INSERTION;  Surgeon: Katha Cabal, MD;  Location: [facility];  Service: Cardiovascular;  Laterality: N/A;    Social History Social History   Tobacco Use  . Smoking status: Never Smoker  . Smokeless tobacco: Never Used  Vaping Use  . Vaping Use: Never used  Substance Use Topics  . Alcohol use: Never  . Drug use: Never    Family History Family History  Problem Relation Age of Onset  . Breast cancer Sister 27    Allergies  Allergen Reactions  . Levofloxacin Nausea Only     REVIEW OF SYSTEMS (Negative unless checked)  Constitutional: [] Weight loss  [] Fever  [] Chills Cardiac: [] Chest pain   [] Chest pressure   [] Palpitations   [] Shortness of breath when laying flat   [] Shortness of breath with exertion. Vascular:  [] Pain in legs with walking   [] Pain in legs at rest  []   History of DVT   [] Phlebitis   [] Swelling in legs   [] Varicose veins   [] Non-healing ulcers Pulmonary:   [] Uses home oxygen   [] Productive cough   [] Hemoptysis   [] Wheeze  [] COPD   [] Asthma Neurologic:  [] Dizziness   [] Seizures   [] History of stroke   [] History of TIA  [] Aphasia   [] Vissual changes   [] Weakness or numbness in arm   [] Weakness or numbness in leg Musculoskeletal:   [] Joint swelling   [] Joint pain   [] Low back pain Hematologic:  [] Easy bruising  [] Easy bleeding   [] Hypercoagulable state   [] Anemic Gastrointestinal:  [] Diarrhea   [] Vomiting  [] Gastroesophageal reflux/heartburn   [] Difficulty swallowing. Genitourinary:  [] Chronic kidney disease   [] Difficult urination  [] Frequent  urination   [] Blood in urine Skin:  [] Rashes   [] Ulcers  Psychological:  [] History of anxiety   []  History of major depression.  Physical Examination  Vitals:   11/16/20 1348  BP: 124/68  Pulse: 69  Weight: 179 lb (81.2 kg)  Height: 5\' 6"  (1.676 m)   Body mass index is 28.89 kg/m. Gen: WD/WN, NAD Head: [place]/AT, No temporalis wasting.  Ear/Nose/Throat: Hearing grossly intact, nares w/o erythema or drainage Eyes: PER, EOMI, sclera nonicteric.  Neck: Supple, no large masses.   Pulmonary:  Good air movement, no audible wheezing bilaterally, no use of accessory muscles.  Cardiac: RRR, no JVD Vascular: scattered varicosities present bilaterally.  Mild venous stasis changes to the legs bilaterally.  2+ soft pitting edema Vessel Right Left  Radial Palpable Palpable  Gastrointestinal: Non-distended. No guarding/no peritoneal signs.  Musculoskeletal: M/S 5/5 throughout.  No deformity or atrophy.  Neurologic: CN 2-12 intact. Symmetrical.  Speech is fluent. Motor exam as listed above. Psychiatric: Judgment intact, Mood & affect appropriate for pt's clinical situation. Dermatologic: Mild venous rashes or ulcers noted.  No changes consistent with cellulitis. Lymph : + lichenification / skin changes of chronic lymphedema.  CBC No results found for: WBC, HGB, HCT, MCV, PLT  BMET    Component Value Date/Time   CREATININE 0.90 05/30/2018 1656   CrCl cannot be calculated (Patient's most recent lab result is older than the maximum 21 days allowed.).  COAG No results found for: INR, PROTIME  Radiology No results found.    Assessment/Plan 1. History of pulmonary embolus (PE) The patient will continue anticoagulation for now as there have not been any problems or complications at this point.  IVC filter is strongly indicated prior to high risk orthopedic surgery.  Especially given the history of PE with the past DVT.  IVC filter placement will be done the week for surgery.  Patient is  still undergoing cardiac clearance and has not yet set a date for the left total knee replacement.  I have instructed her to call the office when she does have a date so that we can plan appropriately for her filter placement.  Risk and benefits were reviewed the patient.  Indications for the procedure were reviewed.  All questions were answered, the patient agrees to proceed.   I have had a long discussion with the patient regarding DVT and post phlebitic changes such as swelling and why it  causes symptoms such as pain.  The patient will wear graduated compression stockings class 1 (20-30 mmHg) on a daily basis a prescription was given. The patient will  beginning wearing the stockings first thing in the morning and removing them in the evening. The patient is instructed specifically not to sleep in the  stockings.  In addition, behavioral modification including elevation during the day and avoidance of prolonged dependency will be initiated.    The patient will follow-up with me once her surgery has been rescheduled  2. Venous insufficiency  No surgery or intervention at this point in time.  I have stressed to the patient the importance of optimal control of her lymphedema prior to surgery.  I have urged her to continue to wear her compression stockings routinely and to elevate periodically throughout the day.  I have reviewed my previous discussion with the patient regarding swelling and why it  causes symptoms.  The patient is doing well with compression and will continue wearing graduated compression stockings class 1 (20-30 mmHg) on a daily basis a prescription was given. The patient will  continue wearing the stockings first thing in the morning and removing them in the evening. The patient is instructed specifically not to sleep in the stockings.    In addition, behavioral modification including elevation during the day and exercise will be continued.    Patient should follow-up on an  annual basis   3. Primary osteoarthritis of left knee Patient will undergo left total knee replacement in the near future  4. Coronary artery disease of native artery of native heart with stable angina pectoris (HCC) Continue cardiac and antihypertensive medications as already ordered and reviewed, no changes at this time.  Continue statin as ordered and reviewed, no changes at this time  Nitrates PRN for chest pain   5. Primary hypertension Continue antihypertensive medications as already ordered, these medications have been reviewed and there are no changes at this time.   Hortencia Pilar, MD  11/16/2020 2:03 PM

## 2020-11-17 ENCOUNTER — Encounter (INDEPENDENT_AMBULATORY_CARE_PROVIDER_SITE_OTHER): Payer: Self-pay | Admitting: Vascular Surgery

## 2020-11-20 ENCOUNTER — Inpatient Hospital Stay: Admit: 2020-11-20 | Payer: PPO | Admitting: Orthopedic Surgery

## 2020-11-20 SURGERY — ARTHROPLASTY, KNEE, TOTAL, USING IMAGELESS COMPUTER-ASSISTED NAVIGATION
Anesthesia: Choice | Site: Knee | Laterality: Left

## 2020-12-07 ENCOUNTER — Telehealth (INDEPENDENT_AMBULATORY_CARE_PROVIDER_SITE_OTHER): Payer: Self-pay

## 2020-12-07 NOTE — Telephone Encounter (Signed)
Patient called stating Dr. Delana Meyer wanted to know about her IVC filter being in and the concern of it being in to long. Per Dr. Delana Meyer the patient needs to be scheduled to come in and be seen a month after her surgery to discuss the filter removal.

## 2020-12-31 ENCOUNTER — Other Ambulatory Visit: Payer: PPO

## 2020-12-31 DIAGNOSIS — M1712 Unilateral primary osteoarthritis, left knee: Secondary | ICD-10-CM | POA: Diagnosis not present

## 2021-01-03 NOTE — Discharge Instructions (Signed)
Instructions after Total Knee Replacement   Kiara Perry, Jr., M.D.     Dept. of Orthopaedics & Sports Medicine  [facility]  [address]  [place], [place]  [address]  Phone: [phone]   Fax: [phone]    DIET: Drink plenty of non-alcoholic fluids. Resume your normal diet. Include foods high in fiber.  ACTIVITY:  You may use crutches or a walker with weight-bearing as tolerated, unless instructed otherwise. You may be weaned off of the walker or crutches by your Physical Therapist.  Do NOT place pillows under the knee. Anything placed under the knee could limit your ability to straighten the knee.   Continue doing gentle exercises. Exercising will reduce the pain and swelling, increase motion, and prevent muscle weakness.   Please continue to use the TED compression stockings for 6 weeks. You may remove the stockings at night, but should reapply them in the morning. Do not drive or operate any equipment until instructed.  WOUND CARE:  Continue to use the PolarCare or ice packs periodically to reduce pain and swelling. You may bathe or shower after the staples are removed at the first office visit following surgery.  MEDICATIONS: You may resume your regular medications. Please take the pain medication as prescribed on the medication. Do not take pain medication on an empty stomach. You have been given a prescription for a blood thinner (Lovenox or Coumadin). Please take the medication as instructed. (NOTE: After completing a 2 week course of Lovenox, take one Enteric-coated aspirin once a day. This along with elevation will help reduce the possibility of phlebitis in your operated leg.) Do not drive or drink alcoholic beverages when taking pain medications.  CALL THE OFFICE FOR: Temperature above 101 degrees Excessive bleeding or drainage on the dressing. Excessive swelling, coldness, or paleness of the toes. Persistent nausea and vomiting.  FOLLOW-UP:  You  should have an appointment to return to the office in 10-14 days after surgery. Arrangements have been made for continuation of Physical Therapy (either home therapy or outpatient therapy).   [facility] Department Directory         [email or web address]       [email or web address]          Cardiology  Appointments: [place] - [phone] [place] - [phone]  Endocrinology  Appointments: [place] - [phone] [place] - [phone]  Gastroenterology  Appointments: [place] - [phone] [place] - [phone]        General Surgery   Appointments: [place] - [phone]  Internal Medicine/Family Medicine  Appointments: [place] - [phone] [place] - [phone] [place] - [phone]  Metabolic and Weigh Loss Surgery  Appointments: [place] - [phone]        Neurology  Appointments: [place] - [phone] [place] - [phone]  Neurosurgery  Appointments: [place] - [phone]  Obstetrics & Gynecology  Appointments: [place] - [phone] [place] - [phone]        Pediatrics  Appointments: [place] - [phone] [place] - [phone]  Physiatry  Appointments: [place] [phone]  Physical Therapy  Appointments: [place] - [phone] [place] - [phone]        Podiatry  Appointments: [place] - [phone] [place] - [phone]  Pulmonology  Appointments: [place] - [phone]  Rheumatology  Appointments: [place] - [phone]        [place] Location: [facility]  [address] [place], [place]  [address]  [place] Location: [facility] [address] [place], [place]  [address]  [place] Location: [facility] [address] [place], [place]  [address]    

## 2021-01-05 ENCOUNTER — Encounter
Admission: RE | Admit: 2021-01-05 | Discharge: 2021-01-05 | Disposition: A | Discharge status: Home / Self Care | Payer: PPO | Source: Ambulatory Visit | Attending: Orthopedic Surgery | Admitting: Orthopedic Surgery

## 2021-01-05 ENCOUNTER — Other Ambulatory Visit: Payer: Self-pay

## 2021-01-05 DIAGNOSIS — Z136 Encounter for screening for cardiovascular disorders: Secondary | ICD-10-CM | POA: Diagnosis not present

## 2021-01-05 DIAGNOSIS — Z01818 Encounter for other preprocedural examination: Secondary | ICD-10-CM | POA: Diagnosis not present

## 2021-01-05 HISTORY — DX: Pneumonia, unspecified organism: J18.9

## 2021-01-05 HISTORY — DX: Gastro-esophageal reflux disease without esophagitis: K21.9

## 2021-01-05 HISTORY — DX: Angina pectoris, unspecified: I20.9

## 2021-01-05 HISTORY — DX: Atherosclerotic heart disease of native coronary artery without angina pectoris: I25.10

## 2021-01-05 HISTORY — DX: Cardiac murmur, unspecified: R01.1

## 2021-01-05 LAB — C-REACTIVE PROTEIN: CRP: 0.6 mg/dL

## 2021-01-05 LAB — URINALYSIS, ROUTINE W REFLEX MICROSCOPIC
Bacteria, UA: NONE SEEN
Bilirubin Urine: NEGATIVE
Glucose, UA: NEGATIVE mg/dL
Hgb urine dipstick: NEGATIVE
Ketones, ur: NEGATIVE mg/dL
Nitrite: NEGATIVE
Protein, ur: NEGATIVE mg/dL
Specific Gravity, Urine: 1.008 (ref 1.005–1.030)
pH: 7 (ref 5.0–8.0)

## 2021-01-05 LAB — COMPREHENSIVE METABOLIC PANEL
ALT: 17 U/L (ref 0–44)
AST: 20 U/L (ref 15–41)
Albumin: 3.8 g/dL (ref 3.5–5.0)
Alkaline Phosphatase: 73 U/L (ref 38–126)
Anion gap: 8 (ref 5–15)
BUN: 19 mg/dL (ref 8–23)
CO2: 28 mmol/L (ref 22–32)
Calcium: 9.5 mg/dL (ref 8.9–10.3)
Chloride: 99 mmol/L (ref 98–111)
Creatinine, Ser: 1.02 mg/dL — ABNORMAL HIGH (ref 0.44–1.00)
GFR, Estimated: 55 mL/min — ABNORMAL LOW (ref >60)
Glucose, Bld: 97 mg/dL (ref 70–99)
Potassium: 4.1 mmol/L (ref 3.5–5.1)
Sodium: 135 mmol/L (ref 135–145)
Total Bilirubin: 0.8 mg/dL (ref 0.3–1.2)
Total Protein: 6.9 g/dL (ref 6.5–8.1)

## 2021-01-05 LAB — CBC
HCT: 38.4 % (ref 36.0–46.0)
Hemoglobin: 12.6 g/dL (ref 12.0–15.0)
MCH: 30 pg (ref 26.0–34.0)
MCHC: 32.8 g/dL (ref 30.0–36.0)
MCV: 91.4 fL (ref 80.0–100.0)
Platelets: 262 10*3/uL (ref 150–400)
RBC: 4.2 MIL/uL (ref 3.87–5.11)
RDW: 13.2 % (ref 11.5–15.5)
WBC: 6.3 10*3/uL (ref 4.0–10.5)
nRBC: 0 % (ref 0.0–0.2)

## 2021-01-05 LAB — TYPE AND SCREEN
ABO/RH(D): A POS
Antibody Screen: NEGATIVE

## 2021-01-05 LAB — APTT: aPTT: 25 seconds (ref 24–36)

## 2021-01-05 LAB — SURGICAL PCR SCREEN
MRSA, PCR: NEGATIVE
Staphylococcus aureus: NEGATIVE

## 2021-01-05 LAB — PROTIME-INR
INR: 0.9 (ref 0.8–1.2)
Prothrombin Time: 12.2 seconds (ref 11.4–15.2)

## 2021-01-05 LAB — SEDIMENTATION RATE: Sed Rate: 24 mm/hr (ref 0–30)

## 2021-01-05 NOTE — Patient Instructions (Addendum)
Your procedure is scheduled on: 01/11/21- Monday Report to the Registration Desk on the 1st floor of the [facility]. To find out your arrival time, please call [phone] between 1PM - 3PM on: 01/08/21- Friday  Report to [facility] 01/07/21 for Covid Testing, Park and Walk in between 8 am and 1 pm.  REMEMBER: Instructions that are not followed completely may result in serious medical risk, up to and including death; or upon the discretion of your surgeon and anesthesiologist your surgery may need to be rescheduled.  Do not eat food after midnight the night before surgery.  No gum chewing, lozengers or hard candies.  You may however, drink CLEAR liquids up to 2 hours before you are scheduled to arrive for your surgery. Do not drink anything within 2 hours of your scheduled arrival time.  Clear liquids include: - water  - apple juice without pulp - gatorade (not RED, PURPLE, OR BLUE) - black coffee or tea (Do NOT add milk or creamers to the coffee or tea) Do NOT drink anything that is not on this list.  Type 1 and Type 2 diabetics should only drink water.  In addition, your doctor has ordered for you to drink the provided  Ensure Pre-Surgery Clear Carbohydrate Drink  Drinking this carbohydrate drink up to two hours before surgery helps to reduce insulin resistance and improve patient outcomes. Please complete drinking 2 hours prior to scheduled arrival time.  TAKE THESE MEDICATIONS THE MORNING OF SURGERY WITH A SIP OF WATER:  - bisoprolol (ZEBETA) 5 MG tablet - omeprazole (PRILOSEC) 40 MG capsule take one the night before and one on the morning of surgery - helps to prevent nausea after surgery.  Follow recommendations from Cardiologist, Pulmonologist or PCP regarding stopping Aspirin, Coumadin, Plavix, Eliquis, Pradaxa, or Pletal. Stopped taking on 01/04/21  One week prior to surgery: Stop Anti-inflammatories (NSAIDS) such as Advil, Aleve, Ibuprofen, Motrin, Naproxen,  Naprosyn and Aspirin based products such as Excedrin, Goodys Powder, BC Powder.  Stop ANY OVER THE COUNTER supplements until after surgery, Calcium Carbonate-Vitamin D 600-400 MG-UNIT tablet  No Alcohol for 24 hours before or after surgery.  No Smoking including e-cigarettes for 24 hours prior to surgery.  No chewable tobacco products for at least 6 hours prior to surgery.  No nicotine patches on the day of surgery.  Do not use any "recreational" drugs for at least a week prior to your surgery.  Please be advised that the combination of cocaine and anesthesia may have negative outcomes, up to and including death. If you test positive for cocaine, your surgery will be cancelled.  On the morning of surgery brush your teeth with toothpaste and water, you may rinse your mouth with mouthwash if you wish. Do not swallow any toothpaste or mouthwash.  Do not wear jewelry, make-up, hairpins, clips or nail polish.  Do not wear lotions, powders, or perfumes.   Do not shave body from the neck down 48 hours prior to surgery just in case you cut yourself which could leave a site for infection.  Also, freshly shaved skin may become irritated if using the CHG soap.  Contact lenses, hearing aids and dentures may not be worn into surgery.  Do not bring valuables to the hospital. [facility] is not responsible for any missing/lost belongings or valuables.   Use CHG Soap or wipes as directed on instruction sheet.  Notify your doctor if there is any change in your medical condition (cold, fever, infection).  Wear  comfortable clothing (specific to your surgery type) to the hospital.  Plan for stool softeners for home use; pain medications have a tendency to cause constipation. You can also help prevent constipation by eating foods high in fiber such as fruits and vegetables and drinking plenty of fluids as your diet allows.  After surgery, you can help prevent lung complications by doing breathing  exercises.  Take deep breaths and cough every 1-2 hours. Your doctor may order a device called an Incentive Spirometer to help you take deep breaths. When coughing or sneezing, hold a pillow firmly against your incision with both hands. This is called "splinting." Doing this helps protect your incision. It also decreases belly discomfort.  If you are being admitted to the hospital overnight, leave your suitcase in the car. After surgery it may be brought to your room.  If you are being discharged the day of surgery, you will not be allowed to drive home. You will need a responsible adult (18 years or older) to drive you home and stay with you that night.   If you are taking public transportation, you will need to have a responsible adult (18 years or older) with you. Please confirm with your physician that it is acceptable to use public transportation.   Please call the [facility] at [phone] if you have any questions about these instructions.  Surgery Visitation Policy:  Patients undergoing a surgery or procedure may have one family member or support person with them as long as that person is not COVID-19 positive or experiencing its symptoms.  That person may remain in the waiting area during the procedure.  Inpatient Visitation:    Visiting hours are 7 a.m. to 8 p.m. Inpatients will be allowed two visitors daily. The visitors may change each day during the patient's stay. No visitors under the age of 38. Any visitor under the age of 42 must be accompanied by an adult. The visitor must pass COVID-19 screenings, use hand sanitizer when entering and exiting the patient's room and wear a mask at all times, including in the patient's room. Patients must also wear a mask when staff or their visitor are in the room. Masking is required regardless of vaccination status.

## 2021-01-06 ENCOUNTER — Telehealth (INDEPENDENT_AMBULATORY_CARE_PROVIDER_SITE_OTHER): Payer: Self-pay | Admitting: Vascular Surgery

## 2021-01-06 ENCOUNTER — Encounter: Payer: Self-pay | Admitting: Orthopedic Surgery

## 2021-01-06 DIAGNOSIS — R600 Localized edema: Secondary | ICD-10-CM | POA: Diagnosis not present

## 2021-01-06 DIAGNOSIS — I10 Essential (primary) hypertension: Secondary | ICD-10-CM | POA: Diagnosis not present

## 2021-01-06 DIAGNOSIS — I872 Venous insufficiency (chronic) (peripheral): Secondary | ICD-10-CM | POA: Diagnosis not present

## 2021-01-06 DIAGNOSIS — I2510 Atherosclerotic heart disease of native coronary artery without angina pectoris: Secondary | ICD-10-CM | POA: Diagnosis not present

## 2021-01-06 DIAGNOSIS — I2584 Coronary atherosclerosis due to calcified coronary lesion: Secondary | ICD-10-CM | POA: Diagnosis not present

## 2021-01-06 DIAGNOSIS — I700 Atherosclerosis of aorta: Secondary | ICD-10-CM | POA: Diagnosis not present

## 2021-01-06 DIAGNOSIS — E7800 Pure hypercholesterolemia, unspecified: Secondary | ICD-10-CM | POA: Diagnosis not present

## 2021-01-06 NOTE — Telephone Encounter (Signed)
Patient called in stating she needed to let Dr. Delana Meyer know about an upcoming procedure.  Patient states he needed to know to take out her IVC filter she had placed back on 10/13/2020 before her procedure.  Patient did call in on Feb 2022 inquiring about the same subject but appointment wasn't made.  Patient was last seen 11/16/2020 with Dr. Delana Meyer.  Should she be added to Dr. Delana Meyer schedule tomorrow?

## 2021-01-06 NOTE — Telephone Encounter (Signed)
Called patient back to offer an appointment on 01/06/2021, with Dr. Delana Meyer on 01/07/2021 patient states she doesn't need an appointment.  Per first conversation (01/06/2021) and telephone note left (12/07/2020) patient stated appointment needed in regards to IVC filter removal before procedure.  I let patient know that Dr. Delana Meyer is aware of her procedure on 12/14/2020.

## 2021-01-06 NOTE — Progress Notes (Signed)
Perioperative Services  Pre-Admission/Anesthesia Testing Clinical Review  Date: 01/06/21  Patient Demographics:  Name: Kiara Perry DOB:   1935-04-29 MRN:   [id number]  Planned Surgical Procedure(s):    Case: [id number] Date/Time: 01/11/21 1105   Procedure: COMPUTER ASSISTED TOTAL KNEE ARTHROPLASTY - RNFA (Left Knee) - follow 1st case   Anesthesia type: Choice   Pre-op diagnosis: PRIMARY OSTEOARTHRITIS OF LEFT KNEE.   Location: [facility] OR ROOM 01 / [facility]   Surgeons: Dereck Leep, MD    NOTE: Available PAT nursing documentation and vital signs have been reviewed. Clinical nursing staff has updated patient's PMH/PSHx, current medication list, and drug allergies/intolerances to ensure comprehensive history available to assist in medical decision making as it pertains to the aforementioned surgical procedure and anticipated anesthetic course.   Clinical Discussion:  Kiara Perry is a 86 y.o. female who is submitted for pre-surgical anesthesia review and clearance prior to her undergoing the above procedure. Patient has never been a smoker. Pertinent PMH includes: CAD, angina, cardiac murmur, aortic atherosclerosis, HTN, HLD, DVT/PE, GERD (on daily PPI), OA.  Patient is followed by cardiology Nehemiah Massed, MD). She was last seen in the cardiology clinic on 10/08/2020; notes reviewed.  At the time of her clinic visit, patient noted to be doing well overall from a cardiovascular perspective. She denied chest pain, shortness of breath, orthopnea, PND, palpitations, vertiginous symptoms, and presyncope/syncope.  Patient has chronic peripheral edema for which she uses daily diuretic therapy, compression, and elevation to control last TTE performed on 03/27/2020 revealed normal left ventricular systolic function with an EF of >55%. Subsequent myocardial perfusion imaging study performed on 04/06/2020 revealed no evidence of stress-induced myocardial ischemia or arrhythmia  (see full interpretation of cardiovascular testing below). Patient on GDMT for her HTN diagnosis.  Blood pressure well controlled at 136/78 on currently prescribed bisoprolol, hydralazine, telmisartan, and torsemide therapies.  Patient is on a statin for her HLD. Functional capacity, as defined by DASI, is documented as being >/= 4 METS.  Given patient's past medical history of VTE, cardiology places patient at high risk for recurrence.  Long discussion regarding need for IVC filter placement discussed, and patient in agreement.  No changes were made to patient's medication regimen.  Patient to follow-up with outpatient cardiology in 3 months or sooner if needed.  Patient is scheduled to undergo an elective orthopedic procedure on 01/11/2021 with Dr. Skip Estimable.  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  Cardiology recommended IVC filter placement prior to procedure.  Patient did have an IVC filter placed on 10/13/2020; plans are for filter to remain in place for at least 3 months postoperatively prior to it being removed.  Given patient's past medical history significant for cardiovascular disease, presurgical cardiac clearance was sought by the PAT team. Per cardiology, "this patient is optimized for surgery and may proceed with the planned procedural course with a LOW risk stratification".  This patient is on daily antiplatelet therapy.  She has been instructed on recommendations from cardiology for holding her daily low-dose ASA for 7 days prior to her procedure with plans to restart service postoperatively and restart bumetanide by her attending surgeon.  Patient has been instructed that her last dose of ASA will be on 01/04/2021.  Patient denies previous perioperative complications with anesthesia in the past. In review of the available records, it does not appear as if patient has ever had surgical procedure within the [facility] system.  Vitals with BMI 11/16/2020  10/13/2020 10/13/2020  Height 5\' 6"  - -  Weight 179 lbs - -  BMI 16.10 - -  Systolic 960 454 098  Diastolic 68 90 57  Pulse 69 - 56    Providers/Specialists:   NOTE: Primary physician provider listed below. Patient may have been seen by APP or partner within same practice.   PROVIDER ROLE / SPECIALTY LAST OV  Hooten, Laurice Record, MD Orthopedics (Surgeon)  12/31/2020  Idelle Crouch, MD Primary Care Provider  11/06/2020  Serafina Royals, MD Cardiology  10/08/2020   Allergies:  Levofloxacin  Current Home Medications:   No current facility-administered medications for this encounter.   Marland Kitchen aspirin EC 81 MG tablet  . bisoprolol (ZEBETA) 5 MG tablet  . Calcium Carbonate-Vitamin D 600-400 MG-UNIT tablet  . gabapentin (NEURONTIN) 100 MG capsule  . hydrALAZINE (APRESOLINE) 25 MG tablet  . lovastatin (MEVACOR) 40 MG tablet  . omeprazole (PRILOSEC) 40 MG capsule  . telmisartan (MICARDIS) 40 MG tablet  . torsemide (DEMADEX) 10 MG tablet  . traMADol (ULTRAM) 50 MG tablet   History:   Past Medical History:  Diagnosis Date  . Anginal pain ([place])   . Aortic atherosclerosis ([place])   . Arthritis   . Coronary artery disease   . COVID-19 09/2019  . GERD (gastroesophageal reflux disease)   . Heart murmur   . History of pulmonary embolus (PE)   . Hyperlipidemia   . Hypertension   . Pneumonia    Past Surgical History:  Procedure Laterality Date  . ABDOMINAL HYSTERECTOMY    . APPENDECTOMY    . BACK SURGERY    . BREAST BIOPSY Left 1973   neg  . BREAST CYST ASPIRATION Left 1970   neg  . COLONOSCOPY    . ESOPHAGOGASTRODUODENOSCOPY    . EYE SURGERY     cateract  . IVC FILTER INSERTION N/A 10/13/2020   Procedure: IVC FILTER INSERTION;  Surgeon: Katha Cabal, MD;  Location: [facility];  Service: Cardiovascular;  Laterality: N/A;  . MENISCUS REPAIR     right knee  . TONSILLECTOMY     Family History  Problem Relation Age of Onset  . Breast cancer Sister 7    Social History   Tobacco Use  . Smoking status: Never Smoker  . Smokeless tobacco: Never Used  Vaping Use  . Vaping Use: Never used  Substance Use Topics  . Alcohol use: Never  . Drug use: Never    Pertinent Clinical Results:  LABS: Labs reviewed: Acceptable for surgery.  Hospital Outpatient Visit on 01/05/2021  Component Date Value Ref Range Status  . CRP 01/05/2021 0.6  <1.0 mg/dL Final   Performed at [facility] [address] [place], [place] [address]  . MRSA, PCR 01/05/2021 NEGATIVE  NEGATIVE Final  . Staphylococcus aureus 01/05/2021 NEGATIVE  NEGATIVE Final   Comment: (NOTE) The Xpert SA Assay (FDA approved for NASAL specimens in patients 33 years of age and older), is one component of a comprehensive surveillance program. It is not intended to diagnose infection nor to guide or monitor treatment. Performed at [facility], [address], [place], [place] [address]   . Sed Rate 01/05/2021 24  0 - 30 mm/hr Final   Performed at [facility], [address] [place], [place] [address]  . WBC 01/05/2021 6.3  4.0 - 10.5 K/uL Final  . RBC 01/05/2021 4.20  3.87 - 5.11 MIL/uL Final  . Hemoglobin  01/05/2021 12.6  12.0 - 15.0 g/dL Final  . HCT 01/05/2021 38.4  36.0 - 46.0 % Final  . MCV 01/05/2021 91.4  80.0 - 100.0 fL Final  . MCH 01/05/2021 30.0  26.0 - 34.0 pg Final  . MCHC 01/05/2021 32.8  30.0 - 36.0 g/dL Final  . RDW 01/05/2021 13.2  11.5 - 15.5 % Final  . Platelets 01/05/2021 262  150 - 400 K/uL Final  . nRBC 01/05/2021 0.0  0.0 - 0.2 % Final   Performed at [facility], [address] [place], [place] [address]  . Sodium 01/05/2021 135  135 - 145 mmol/L Final  . Potassium 01/05/2021 4.1  3.5 - 5.1 mmol/L Final  . Chloride 01/05/2021 99  98 - 111 mmol/L Final  . CO2 01/05/2021 28  22 - 32 mmol/L Final  . Glucose, Bld 01/05/2021 97  70 - 99 mg/dL Final   Glucose reference range applies only to samples taken after fasting  for at least 8 hours.  . BUN 01/05/2021 19  8 - 23 mg/dL Final  . Creatinine, Ser 01/05/2021 1.02* 0.44 - 1.00 mg/dL Final  . Calcium 01/05/2021 9.5  8.9 - 10.3 mg/dL Final  . Total Protein 01/05/2021 6.9  6.5 - 8.1 g/dL Final  . Albumin 01/05/2021 3.8  3.5 - 5.0 g/dL Final  . AST 01/05/2021 20  15 - 41 U/L Final  . ALT 01/05/2021 17  0 - 44 U/L Final  . Alkaline Phosphatase 01/05/2021 73  38 - 126 U/L Final  . Total Bilirubin 01/05/2021 0.8  0.3 - 1.2 mg/dL Final  . GFR, Estimated 01/05/2021 55* >60 mL/min Final   Comment: (NOTE) Calculated using the CKD-EPI Creatinine Equation (2021)   . Anion gap 01/05/2021 8  5 - 15 Final   Performed at [facility], [address] [place], [place] [address]  . Prothrombin Time 01/05/2021 12.2  11.4 - 15.2 seconds Final  . INR 01/05/2021 0.9  0.8 - 1.2 Final   Performed at [facility], [address] [place], [place] [address]  . aPTT 01/05/2021 25  24 - 36 seconds Final   Performed at [facility], [address] [place], [place] [address]  . Color, Urine 01/05/2021 STRAW* YELLOW Final  . APPearance 01/05/2021 CLEAR* CLEAR Final  . Specific Gravity, Urine 01/05/2021 1.008  1.005 - 1.030 Final  . pH 01/05/2021 7.0  5.0 - 8.0 Final  . Glucose, UA 01/05/2021 NEGATIVE  NEGATIVE mg/dL Final  . Hgb urine dipstick 01/05/2021 NEGATIVE  NEGATIVE Final  . Bilirubin Urine 01/05/2021 NEGATIVE  NEGATIVE Final  . Ketones, ur 01/05/2021 NEGATIVE  NEGATIVE mg/dL Final  . Protein, ur 01/05/2021 NEGATIVE  NEGATIVE mg/dL Final  . Nitrite 01/05/2021 NEGATIVE  NEGATIVE Final  . Leukocytes,Ua 01/05/2021 TRACE* NEGATIVE Final  . RBC / HPF 01/05/2021 0-5  0 - 5 RBC/hpf Final  . WBC, UA 01/05/2021 0-5  0 - 5 WBC/hpf Final  . Bacteria, UA 01/05/2021 NONE SEEN  NONE SEEN Final  . Squamous Epithelial / LPF 01/05/2021 0-5  0 - 5 Final   Performed at [facility], [address] [place], [place] [address]  . ABO/RH(D)  01/05/2021 A POS   Final  . Antibody Screen 01/05/2021 NEG   Final  . Sample Expiration 01/05/2021 01/19/2021,2359   Final  . Extend sample reason 01/05/2021    Final  Value:NO TRANSFUSIONS OR PREGNANCY IN THE PAST 3 MONTHS Performed at [facility], [address] [place], [place] [address]     ECG: Date: 01/05/2021 Time ECG obtained: 1202 PM Rate: 55 bpm Rhythm: sinus bradycardia Axis (leads I and aVF): Normal Intervals: PR 176 ms. QRS 68 ms. QTc 384 ms. ST segment and T wave changes: No evidence of acute ST segment elevation or depression Comparison: Similar to previous tracing obtained on 07/14/2011   IMAGING / PROCEDURES: LEXISCAN performed on 04/06/2020 1. LVEF 75% 2. Regional wall motion reveals normal myocardial thickening and wall motion 3. No artifact noted 4. Left ventricular cavity size normal 5. No evidence of stress-induced myocardial ischemia or arrhythmia 6. The overall quality of the study is good 7. Normal low risk STUDY  ECHOCARDIOGRAM performed on 03/27/2020 1. LVEF >55% 2. Normal left ventricular systolic function 3. Normal right ventricular systolic function 4. Trivial AR and PR 5. Moderate MR 6. Mild TR 7. No valvular stenosis 8. No evidence of a pericardial effusion  VASCULAR ULTRASOUND ABI WITH/WITHOUT TBI performed on 09/26/2019 1. Right:  ABI is within normal range.  No evidence of significant right lower extremity arterial disease  TBI is normal 2. Left:  ABIs in the normal range  No evidence of significant left lower extremity arterial disease  TBI is normal  Impression and Plan:  SHARLA TANKARD has been referred for pre-anesthesia review and clearance prior to her undergoing the planned anesthetic and procedural courses. Available labs, pertinent testing, and imaging results were personally reviewed by me. This patient has been appropriately cleared by cardiology with an overall LOW risk of  significant perioperative cardiovascular complications.  Based on clinical review performed today (01/06/21), barring any significant acute changes in the patient's overall condition, it is anticipated that she will be able to proceed with the planned surgical intervention. Any acute changes in clinical condition may necessitate her procedure being postponed and/or cancelled. Pre-surgical instructions were reviewed with the patient during her PAT appointment and questions were fielded by PAT clinical staff.  Honor Loh, MSN, APRN, FNP-C, CEN [facility]  Peri-operative Services Nurse Practitioner Phone: [phone] 01/06/21 9:07 AM  NOTE: This note has been prepared using Dragon dictation software. Despite my best ability to proofread, there is always the potential that unintentional transcriptional errors may still occur from this process.

## 2021-01-07 ENCOUNTER — Other Ambulatory Visit
Admission: RE | Admit: 2021-01-07 | Discharge: 2021-01-07 | Disposition: A | Discharge status: Home / Self Care | Payer: PPO | Source: Ambulatory Visit | Attending: Orthopedic Surgery | Admitting: Orthopedic Surgery

## 2021-01-07 ENCOUNTER — Other Ambulatory Visit: Payer: Self-pay

## 2021-01-07 DIAGNOSIS — Z20822 Contact with and (suspected) exposure to covid-19: Secondary | ICD-10-CM | POA: Insufficient documentation

## 2021-01-07 DIAGNOSIS — Z01812 Encounter for preprocedural laboratory examination: Secondary | ICD-10-CM | POA: Diagnosis not present

## 2021-01-07 LAB — URINE CULTURE: Special Requests: NORMAL

## 2021-01-07 LAB — SARS CORONAVIRUS 2 (TAT 6-24 HRS): SARS Coronavirus 2: NEGATIVE

## 2021-01-09 NOTE — H&P (Signed)
ORTHOPAEDIC HISTORY & PHYSICAL Gwenlyn Fudge, [place] - 12/31/2020 3:45 PM EST Formatting of this note is different from the original. [facility] Chief Complaint:   Chief Complaint  Patient presents with  . Knee Pain  H & P LEFT KNEE   History of Present Illness:   Kiara Perry is a 86 y.o. female that presents to clinic today for her preoperative history and evaluation. Patient presents unaccompanied. The patient is scheduled to undergo a left total knee arthroplasty on 01/11/21 by Dr. Marry Guan. Her pain began more than 10 years ago. The pain is located primarily along the lateral aspect of the knee. She describes her pain as worse with weightbearing, standing, and walking. She reports associated swelling, some giving way of the knee. She denies associated numbness or tingling, denies locking.   The patient's symptoms have progressed to the point that they decrease her quality of life. The patient has previously undergone conservative treatment including NSAIDS and injections to the knee without adequate control of her symptoms.  Patient has a history of PE. She has had an IVC filter placed by Dr Delana Meyer. She is seen by Dr. Nehemiah Massed for cardiology and has an appointment next week. She states she has already received cardiac clearance.   Past Medical, Surgical, Family, Social History, Allergies, Medications:   Past Medical History:  Past Medical History:  Diagnosis Date  . Acute left-sided low back pain with left-sided sciatica 05/19/2017  . Bilateral hearing loss  . Chronic headaches  . Chronic insomnia  . COVID-19 09/2019  . Fibrocystic breast disease  . GERD (gastroesophageal reflux disease)  . Hiatal hernia  . History of colonic polyps  . History of phlebitis  . Hyperlipidemia  . Hypertension  . Osteoarthritis  . Osteoporosis  . Primary osteoarthritis of left knee 05/19/2017  . Pulmonary embolism (CMS-HCC)   Past Surgical  History:  Past Surgical History:  Procedure Laterality Date  . BACK SURGERY  . EXCISION GANGLION CYST WRIST PRIMARY Right  . HERNIA REPAIR  . HYSTERECTOMY  . KNEE ARTHROSCOPY Right 11/25/05  partial medial and partial lateral menisectomies with synovectomy  . TONSILLECTOMY   Current Medications:  Current Outpatient Medications  Medication Sig Dispense Refill  . acetaminophen (TYLENOL) 500 MG tablet Take 500 mg by mouth once daily as needed for Pain  . amoxicillin (AMOXIL) 500 MG capsule as directed  . aspirin 81 MG EC tablet Take 81 mg by mouth once daily  . bisoprolol (ZEBETA) 5 MG tablet Take 1 tablet (5 mg total) by mouth once daily 90 tablet 1  . calcium carbonate-vitamin D3 (CALTRATE 600+D) 600 mg(1,500mg ) -400 unit tablet Take 1 tablet by mouth 2 (two) times daily with meals  . gabapentin (NEURONTIN) 100 MG capsule Take 2 capsules (200 mg total) by mouth nightly 180 capsule 1  . lovastatin (MEVACOR) 40 MG tablet TAKE 2 TABLETS (80 MG TOTAL) BY MOUTH DAILY WITH DINNER 180 tablet 1  . omeprazole (PRILOSEC) 40 MG DR capsule Take 1 capsule (40 mg total) by mouth once daily 90 capsule 3  . telmisartan (MICARDIS) 40 MG tablet Take 1 tablet (40 mg total) by mouth once daily 90 tablet 3  . TORsemide (DEMADEX) 10 MG tablet Take 1 tablet (10 mg total) by mouth once daily 90 tablet 3  . traMADoL (ULTRAM) 50 mg tablet Take 1 tablet (50 mg total) by mouth every 6 (six) hours as needed for Pain 30 tablet 0   No  current facility-administered medications for this visit.   Allergies:  Allergies  Allergen Reactions  . Levaquin [Levofloxacin] Nausea   Social History:  Social History   Socioeconomic History  . Marital status: Married  Spouse name: Kiara Perry  . Number of children: 3  . Years of education: 75  . Highest education level: High school graduate  Occupational History  . Occupation: Retired  Tobacco Use  . Smoking status: Never Smoker  . Smokeless tobacco: Never Used  Vaping Use   . Vaping Use: Never used  Substance and Sexual Activity  . Alcohol use: Never  . Drug use: Never  . Sexual activity: Defer  Other Topics Concern  . Not on file  Social History Narrative  . Not on file   Social Determinants of Health   Financial Resource Strain: Not on file  Food Insecurity: Not on file  Transportation Needs: Not on file  Physical Activity: Not on file  Stress: Not on file  Social Connections: Not on file  Housing Stability: Not on file   Family History:  Family History  Problem Relation Age of Onset  . High blood pressure (Hypertension) Mother  . Stroke Mother  . High blood pressure (Hypertension) Father  . Stroke Father  . Heart disease Sister  . Colon cancer Brother  . Colon cancer Brother  . Colon cancer Brother   Review of Systems:   A 10+ ROS was performed, reviewed, and the pertinent orthopaedic findings are documented in the HPI.   Physical Examination:   BP 130/84 (BP Location: Left upper arm, Patient Position: Sitting, BP Cuff Size: Adult)  Ht 167.6 cm (5\' 6" )  Wt 78.8 kg (173 lb 12.8 oz)  BMI 28.05 kg/m   Patient is a well-developed, well-nourished female in no acute distress. Patient has normal mood and affect. Patient is alert and oriented to person, place, and time.   HEENT: Atraumatic, normocephalic. Pupils equal and reactive to light. Extraocular motion intact. Noninjected sclera.  Cardiovascular: Regular rate and rhythm, with no murmurs, rubs, or gallops. Distal pulses palpable.  Respiratory: Lungs clear to auscultation bilaterally.   Left Knee: Soft tissue swelling: mild Effusion: minimal Erythema: none Crepitance: mild Tenderness: lateral Alignment: relative valgus Mediolateral laxity: lateral pseudolaxity Posterior sag: negative Patellar tracking: Good tracking without evidence of subluxation or tilt Atrophy: No significant atrophy.  Quadriceps tone was fair to good. Range of motion: 0/0/96 degrees  Sensation  intact over the saphenous, lateral sural cutaneous, superficial fibular, and deep fibular nerve distributions.  Tests Performed/Reviewed:  X-rays  3 views of the left knee were obtained. Images reveal loss of medial and lateral compartment joint spaces with subchondral sclerosis and cyst formation noted laterally. Complete loss of patellofemoral joint space with bone-on-bone contact noted. No fractures or dislocations.  I personally ordered and interpreted today's x-rays  Impression:   ICD-10-CM  1. Primary osteoarthritis of left knee M17.12   Plan:   The patient has end-stage degenerative changes of the left knee. It was explained to the patient that the condition is progressive in nature. Having failed conservative treatment, the patient has elected to proceed with a total joint arthroplasty. The patient will undergo a total joint arthroplasty with Dr. Marry Guan. The risks of surgery, including blood clot and infection, were discussed with the patient. Measures to reduce these risks, including the use of anticoagulation, perioperative antibiotics, and early ambulation were discussed. The importance of postoperative physical therapy was discussed with the patient. The patient elects to proceed with surgery. The  patient is instructed to stop all blood thinners prior to surgery. The patient is instructed to call the hospital the day before surgery to learn of the proper arrival time.   Contact our office with any questions or concerns. Follow up as indicated, or sooner should any new problems arise, if conditions worsen, or if they are otherwise concerned.   Gwenlyn Fudge, PA-C [facility] [address], [place] [address] Phone: [phone]  This note was generated in part with voice recognition software and I apologize for any typographical errors that were not detected and corrected.   Electronically signed by Gwenlyn Fudge,  PA at 01/04/2021 3:45 PM EDT

## 2021-01-10 ENCOUNTER — Encounter: Payer: Self-pay | Admitting: Orthopedic Surgery

## 2021-01-10 MED ORDER — GABAPENTIN 300 MG PO CAPS: 300.0000 mg | ORAL_CAPSULE | Freq: Once | ORAL | Status: AC

## 2021-01-10 MED ORDER — CHLORHEXIDINE GLUCONATE 4 % EX LIQD: 60.0000 mL | Freq: Once | CUTANEOUS | Status: DC

## 2021-01-10 MED ORDER — DEXAMETHASONE SODIUM PHOSPHATE 10 MG/ML IJ SOLN: 8.0000 mg | Freq: Once | INTRAMUSCULAR | Status: AC

## 2021-01-10 MED ORDER — TRANEXAMIC ACID-NACL 1000-0.7 MG/100ML-% IV SOLN
1000.0000 mg | INTRAVENOUS | Status: AC
  Administered 2021-01-11: 1000 mg via INTRAVENOUS

## 2021-01-10 MED ORDER — CELECOXIB 200 MG PO CAPS: 400.0000 mg | ORAL_CAPSULE | Freq: Once | ORAL | Status: AC

## 2021-01-10 MED ORDER — CHLORHEXIDINE GLUCONATE 0.12 % MT SOLN: 15.0000 mL | Freq: Once | OROMUCOSAL | Status: AC

## 2021-01-10 MED ORDER — ORAL CARE MOUTH RINSE: 15.0000 mL | Freq: Once | OROMUCOSAL | Status: AC

## 2021-01-10 MED ORDER — LACTATED RINGERS IV SOLN: INTRAVENOUS | Status: DC

## 2021-01-10 MED ORDER — CEFAZOLIN SODIUM-DEXTROSE 2-4 GM/100ML-% IV SOLN
2.0000 g | INTRAVENOUS | Status: AC
  Administered 2021-01-11: 2 g via INTRAVENOUS

## 2021-01-11 ENCOUNTER — Other Ambulatory Visit: Payer: Self-pay

## 2021-01-11 ENCOUNTER — Encounter: Payer: Self-pay | Admitting: Orthopedic Surgery

## 2021-01-11 ENCOUNTER — Inpatient Hospital Stay: Payer: PPO

## 2021-01-11 ENCOUNTER — Inpatient Hospital Stay: Payer: PPO | Admitting: Urgent Care

## 2021-01-11 ENCOUNTER — Encounter
Admission: RE | Disposition: A | Discharge status: Home / Self Care | Payer: Self-pay | Source: Home / Self Care | Attending: Orthopedic Surgery

## 2021-01-11 ENCOUNTER — Inpatient Hospital Stay
Admission: RE | Admit: 2021-01-11 | Discharge: 2021-01-12 | DRG: 470 | Disposition: A | Discharge status: Home / Self Care | Payer: PPO | Attending: Orthopedic Surgery | Admitting: Orthopedic Surgery

## 2021-01-11 DIAGNOSIS — R519 Headache, unspecified: Secondary | ICD-10-CM | POA: Diagnosis present

## 2021-01-11 DIAGNOSIS — Z8701 Personal history of pneumonia (recurrent): Secondary | ICD-10-CM | POA: Diagnosis not present

## 2021-01-11 DIAGNOSIS — Z8616 Personal history of COVID-19: Secondary | ICD-10-CM

## 2021-01-11 DIAGNOSIS — I2510 Atherosclerotic heart disease of native coronary artery without angina pectoris: Secondary | ICD-10-CM | POA: Diagnosis present

## 2021-01-11 DIAGNOSIS — Z96659 Presence of unspecified artificial knee joint: Secondary | ICD-10-CM

## 2021-01-11 DIAGNOSIS — F5104 Psychophysiologic insomnia: Secondary | ICD-10-CM | POA: Diagnosis present

## 2021-01-11 DIAGNOSIS — K219 Gastro-esophageal reflux disease without esophagitis: Secondary | ICD-10-CM | POA: Diagnosis not present

## 2021-01-11 DIAGNOSIS — I10 Essential (primary) hypertension: Secondary | ICD-10-CM | POA: Diagnosis not present

## 2021-01-11 DIAGNOSIS — M25762 Osteophyte, left knee: Secondary | ICD-10-CM | POA: Diagnosis not present

## 2021-01-11 DIAGNOSIS — Z96652 Presence of left artificial knee joint: Secondary | ICD-10-CM

## 2021-01-11 DIAGNOSIS — Z79899 Other long term (current) drug therapy: Secondary | ICD-10-CM | POA: Diagnosis not present

## 2021-01-11 DIAGNOSIS — Z881 Allergy status to other antibiotic agents status: Secondary | ICD-10-CM | POA: Diagnosis not present

## 2021-01-11 DIAGNOSIS — E785 Hyperlipidemia, unspecified: Secondary | ICD-10-CM | POA: Diagnosis not present

## 2021-01-11 DIAGNOSIS — I700 Atherosclerosis of aorta: Secondary | ICD-10-CM | POA: Diagnosis present

## 2021-01-11 DIAGNOSIS — Z8249 Family history of ischemic heart disease and other diseases of the circulatory system: Secondary | ICD-10-CM | POA: Diagnosis not present

## 2021-01-11 DIAGNOSIS — M1712 Unilateral primary osteoarthritis, left knee: Principal | ICD-10-CM | POA: Diagnosis present

## 2021-01-11 DIAGNOSIS — Z7982 Long term (current) use of aspirin: Secondary | ICD-10-CM

## 2021-01-11 DIAGNOSIS — Z471 Aftercare following joint replacement surgery: Secondary | ICD-10-CM | POA: Diagnosis not present

## 2021-01-11 DIAGNOSIS — H9193 Unspecified hearing loss, bilateral: Secondary | ICD-10-CM | POA: Diagnosis not present

## 2021-01-11 DIAGNOSIS — M810 Age-related osteoporosis without current pathological fracture: Secondary | ICD-10-CM | POA: Diagnosis not present

## 2021-01-11 DIAGNOSIS — Z972 Presence of dental prosthetic device (complete) (partial): Secondary | ICD-10-CM

## 2021-01-11 DIAGNOSIS — Z8672 Personal history of thrombophlebitis: Secondary | ICD-10-CM | POA: Diagnosis not present

## 2021-01-11 DIAGNOSIS — N6019 Diffuse cystic mastopathy of unspecified breast: Secondary | ICD-10-CM | POA: Diagnosis not present

## 2021-01-11 DIAGNOSIS — Z90710 Acquired absence of both cervix and uterus: Secondary | ICD-10-CM

## 2021-01-11 DIAGNOSIS — Z86711 Personal history of pulmonary embolism: Secondary | ICD-10-CM | POA: Diagnosis not present

## 2021-01-11 HISTORY — DX: Personal history of pulmonary embolism: Z86.711

## 2021-01-11 HISTORY — PX: KNEE ARTHROPLASTY: SHX992

## 2021-01-11 HISTORY — DX: Atherosclerosis of aorta: I70.0

## 2021-01-11 SURGERY — ARTHROPLASTY, KNEE, TOTAL, USING IMAGELESS COMPUTER-ASSISTED NAVIGATION
Anesthesia: Spinal | Site: Knee | Laterality: Left

## 2021-01-11 MED ORDER — HYDRALAZINE HCL 25 MG PO TABS
25.0000 mg | ORAL_TABLET | Freq: Every day | ORAL | Status: DC
  Administered 2021-01-11: 25 mg via ORAL
  Filled 2021-01-11: qty 1

## 2021-01-11 MED ORDER — PANTOPRAZOLE SODIUM 40 MG PO TBEC
40.0000 mg | DELAYED_RELEASE_TABLET | Freq: Two times a day (BID) | ORAL | Status: DC
  Administered 2021-01-11 – 2021-01-12 (×2): 40 mg via ORAL
  Filled 2021-01-11 (×2): qty 1

## 2021-01-11 MED ORDER — TORSEMIDE 20 MG PO TABS
10.0000 mg | ORAL_TABLET | Freq: Every day | ORAL | Status: DC
  Administered 2021-01-12: 10 mg via ORAL
  Filled 2021-01-11: qty 1

## 2021-01-11 MED ORDER — BUPIVACAINE HCL (PF) 0.5 % IJ SOLN
INTRAMUSCULAR | Status: DC | PRN
  Administered 2021-01-11: 3 mL

## 2021-01-11 MED ORDER — ACETAMINOPHEN 10 MG/ML IV SOLN: 1000.0000 mg | Freq: Once | INTRAVENOUS | Status: DC | PRN

## 2021-01-11 MED ORDER — PRAVASTATIN SODIUM 20 MG PO TABS
40.0000 mg | ORAL_TABLET | Freq: Every day | ORAL | Status: DC
  Administered 2021-01-11: 40 mg via ORAL
  Filled 2021-01-11: qty 2

## 2021-01-11 MED ORDER — SODIUM CHLORIDE 0.9 % IV SOLN
INTRAVENOUS | Status: DC | PRN
  Administered 2021-01-11: 30 ug/min via INTRAVENOUS

## 2021-01-11 MED ORDER — BUPIVACAINE HCL (PF) 0.25 % IJ SOLN
INTRAMUSCULAR | Status: DC | PRN
  Administered 2021-01-11: 60 mL

## 2021-01-11 MED ORDER — SODIUM CHLORIDE 0.9 % IV SOLN: INTRAVENOUS | Status: DC

## 2021-01-11 MED ORDER — CEFAZOLIN SODIUM-DEXTROSE 2-4 GM/100ML-% IV SOLN
INTRAVENOUS | Status: AC
  Filled 2021-01-11: qty 100

## 2021-01-11 MED ORDER — CEFAZOLIN SODIUM-DEXTROSE 2-4 GM/100ML-% IV SOLN
2.0000 g | Freq: Three times a day (TID) | INTRAVENOUS | Status: AC
  Administered 2021-01-11 – 2021-01-12 (×2): 2 g via INTRAVENOUS
  Filled 2021-01-11 (×2): qty 100

## 2021-01-11 MED ORDER — PHENOL 1.4 % MT LIQD
1.0000 | OROMUCOSAL | Status: DC | PRN
  Filled 2021-01-11: qty 177

## 2021-01-11 MED ORDER — ACETAMINOPHEN 10 MG/ML IV SOLN
INTRAVENOUS | Status: DC | PRN
  Administered 2021-01-11: 1000 mg via INTRAVENOUS

## 2021-01-11 MED ORDER — BISOPROLOL FUMARATE 5 MG PO TABS
5.0000 mg | ORAL_TABLET | Freq: Every day | ORAL | Status: DC
  Administered 2021-01-12: 5 mg via ORAL
  Filled 2021-01-11: qty 1

## 2021-01-11 MED ORDER — IRBESARTAN 150 MG PO TABS
150.0000 mg | ORAL_TABLET | Freq: Every day | ORAL | Status: DC
  Administered 2021-01-12: 150 mg via ORAL
  Filled 2021-01-11: qty 1

## 2021-01-11 MED ORDER — CELECOXIB 200 MG PO CAPS
ORAL_CAPSULE | ORAL | Status: AC
  Administered 2021-01-11: 400 mg via ORAL
  Filled 2021-01-11: qty 1

## 2021-01-11 MED ORDER — DEXAMETHASONE SODIUM PHOSPHATE 10 MG/ML IJ SOLN
INTRAMUSCULAR | Status: AC
  Administered 2021-01-11: 8 mg via INTRAVENOUS
  Filled 2021-01-11: qty 1

## 2021-01-11 MED ORDER — METOCLOPRAMIDE HCL 10 MG PO TABS
10.0000 mg | ORAL_TABLET | Freq: Three times a day (TID) | ORAL | Status: DC
  Administered 2021-01-11 – 2021-01-12 (×3): 10 mg via ORAL
  Filled 2021-01-11 (×3): qty 1

## 2021-01-11 MED ORDER — ACETAMINOPHEN 10 MG/ML IV SOLN
1000.0000 mg | Freq: Four times a day (QID) | INTRAVENOUS | Status: AC
  Administered 2021-01-11 – 2021-01-12 (×4): 1000 mg via INTRAVENOUS
  Filled 2021-01-11 (×4): qty 100

## 2021-01-11 MED ORDER — CHLORHEXIDINE GLUCONATE 0.12 % MT SOLN
OROMUCOSAL | Status: AC
  Administered 2021-01-11: 15 mL via OROMUCOSAL
  Filled 2021-01-11: qty 15

## 2021-01-11 MED ORDER — CALCIUM CARBONATE-VITAMIN D 500-200 MG-UNIT PO TABS
1.0000 | ORAL_TABLET | Freq: Every day | ORAL | Status: DC
  Administered 2021-01-12: 1 via ORAL
  Filled 2021-01-11: qty 1

## 2021-01-11 MED ORDER — DIPHENHYDRAMINE HCL 12.5 MG/5ML PO ELIX
12.5000 mg | ORAL_SOLUTION | ORAL | Status: DC | PRN
Start: 2021-01-11 — End: 2021-01-12

## 2021-01-11 MED ORDER — GABAPENTIN 100 MG PO CAPS
100.0000 mg | ORAL_CAPSULE | Freq: Every day | ORAL | Status: DC
  Administered 2021-01-11: 100 mg via ORAL
  Filled 2021-01-11: qty 1

## 2021-01-11 MED ORDER — ENOXAPARIN SODIUM 30 MG/0.3ML ~~LOC~~ SOLN
30.0000 mg | Freq: Two times a day (BID) | SUBCUTANEOUS | Status: DC
  Administered 2021-01-12: 30 mg via SUBCUTANEOUS
  Filled 2021-01-11: qty 0.3

## 2021-01-11 MED ORDER — PROPOFOL 500 MG/50ML IV EMUL
INTRAVENOUS | Status: AC
  Filled 2021-01-11: qty 50

## 2021-01-11 MED ORDER — TRAMADOL HCL 50 MG PO TABS
50.0000 mg | ORAL_TABLET | ORAL | Status: DC | PRN
Start: 2021-01-11 — End: 2021-01-12
  Administered 2021-01-11: 50 mg via ORAL
  Administered 2021-01-11 – 2021-01-12 (×2): 100 mg via ORAL
  Filled 2021-01-11: qty 1
  Filled 2021-01-11 (×2): qty 2

## 2021-01-11 MED ORDER — FENTANYL CITRATE (PF) 100 MCG/2ML IJ SOLN
INTRAMUSCULAR | Status: AC
  Filled 2021-01-11: qty 2

## 2021-01-11 MED ORDER — ONDANSETRON HCL 4 MG/2ML IJ SOLN: 4.0000 mg | Freq: Four times a day (QID) | INTRAMUSCULAR | Status: DC | PRN

## 2021-01-11 MED ORDER — ONDANSETRON HCL 4 MG/2ML IJ SOLN
INTRAMUSCULAR | Status: DC | PRN
  Administered 2021-01-11: 4 mg via INTRAVENOUS

## 2021-01-11 MED ORDER — FENTANYL CITRATE (PF) 100 MCG/2ML IJ SOLN
INTRAMUSCULAR | Status: DC | PRN
  Administered 2021-01-11 (×2): 25 ug via INTRAVENOUS

## 2021-01-11 MED ORDER — NEOMYCIN-POLYMYXIN B GU 40-200000 IR SOLN
Status: DC | PRN
  Administered 2021-01-11: 16 mL

## 2021-01-11 MED ORDER — ONDANSETRON HCL 4 MG/2ML IJ SOLN
INTRAMUSCULAR | Status: AC
  Filled 2021-01-11: qty 2

## 2021-01-11 MED ORDER — ONDANSETRON HCL 4 MG PO TABS: 4.0000 mg | ORAL_TABLET | Freq: Four times a day (QID) | ORAL | Status: DC | PRN

## 2021-01-11 MED ORDER — SENNOSIDES-DOCUSATE SODIUM 8.6-50 MG PO TABS
1.0000 | ORAL_TABLET | Freq: Two times a day (BID) | ORAL | Status: DC
  Administered 2021-01-11 – 2021-01-12 (×2): 1 via ORAL
  Filled 2021-01-11 (×2): qty 1

## 2021-01-11 MED ORDER — BISACODYL 10 MG RE SUPP: 10.0000 mg | Freq: Every day | RECTAL | Status: DC | PRN

## 2021-01-11 MED ORDER — OXYCODONE HCL 5 MG PO TABS: 5.0000 mg | ORAL_TABLET | Freq: Once | ORAL | Status: DC | PRN

## 2021-01-11 MED ORDER — PROPOFOL 10 MG/ML IV BOLUS
INTRAVENOUS | Status: AC
  Filled 2021-01-11: qty 20

## 2021-01-11 MED ORDER — GABAPENTIN 300 MG PO CAPS
ORAL_CAPSULE | ORAL | Status: AC
  Administered 2021-01-11: 300 mg via ORAL
  Filled 2021-01-11: qty 1

## 2021-01-11 MED ORDER — FERROUS SULFATE 325 (65 FE) MG PO TABS
325.0000 mg | ORAL_TABLET | Freq: Two times a day (BID) | ORAL | Status: DC
  Administered 2021-01-12: 325 mg via ORAL
  Filled 2021-01-11: qty 1

## 2021-01-11 MED ORDER — MENTHOL 3 MG MT LOZG
1.0000 | LOZENGE | OROMUCOSAL | Status: DC | PRN
  Filled 2021-01-11: qty 9

## 2021-01-11 MED ORDER — MAGNESIUM HYDROXIDE 400 MG/5ML PO SUSP
30.0000 mL | Freq: Every day | ORAL | Status: DC
  Administered 2021-01-12: 30 mL via ORAL
  Filled 2021-01-11: qty 30

## 2021-01-11 MED ORDER — TRANEXAMIC ACID-NACL 1000-0.7 MG/100ML-% IV SOLN
INTRAVENOUS | Status: AC
  Filled 2021-01-11: qty 100

## 2021-01-11 MED ORDER — SURGIPHOR WOUND IRRIGATION SYSTEM - OPTIME
TOPICAL | Status: DC | PRN
  Administered 2021-01-11: 450 mL via TOPICAL

## 2021-01-11 MED ORDER — ACETAMINOPHEN 10 MG/ML IV SOLN
INTRAVENOUS | Status: AC
  Filled 2021-01-11: qty 100

## 2021-01-11 MED ORDER — PROPOFOL 500 MG/50ML IV EMUL
INTRAVENOUS | Status: DC | PRN
  Administered 2021-01-11: 125 ug/kg/min via INTRAVENOUS

## 2021-01-11 MED ORDER — FENTANYL CITRATE (PF) 100 MCG/2ML IJ SOLN: 25.0000 ug | INTRAMUSCULAR | Status: DC | PRN

## 2021-01-11 MED ORDER — TRANEXAMIC ACID-NACL 1000-0.7 MG/100ML-% IV SOLN
1000.0000 mg | Freq: Once | INTRAVENOUS | Status: AC
  Administered 2021-01-11: 1000 mg via INTRAVENOUS
  Filled 2021-01-11: qty 100

## 2021-01-11 MED ORDER — SODIUM CHLORIDE 0.9 % IV SOLN
INTRAVENOUS | Status: DC | PRN
  Administered 2021-01-11: 60 mL

## 2021-01-11 MED ORDER — ONDANSETRON HCL 4 MG/2ML IJ SOLN: 4.0000 mg | Freq: Once | INTRAMUSCULAR | Status: DC | PRN

## 2021-01-11 MED ORDER — ALUM & MAG HYDROXIDE-SIMETH 200-200-20 MG/5ML PO SUSP
30.0000 mL | ORAL | Status: DC | PRN
Start: 2021-01-11 — End: 2021-01-12

## 2021-01-11 MED ORDER — FLEET ENEMA 7-19 GM/118ML RE ENEM: 1.0000 | ENEMA | Freq: Once | RECTAL | Status: DC | PRN

## 2021-01-11 MED ORDER — OXYCODONE HCL 5 MG/5ML PO SOLN: 5.0000 mg | Freq: Once | ORAL | Status: DC | PRN

## 2021-01-11 SURGICAL SUPPLY — 75 items
ATTUNE PS FEM LT SZ 4 CEM KNEE (Femur) ×1 IMPLANT
ATTUNE PSRP INSR SZ4 6 KNEE (Insert) ×1 IMPLANT
BASEPLATE TIBIAL ROTATING SZ 4 (Knees) ×1 IMPLANT
BATTERY INSTRU NAVIGATION (MISCELLANEOUS) ×8 IMPLANT
BLADE SAW 70X12.5 (BLADE) ×2 IMPLANT
BLADE SAW 90X13X1.19 OSCILLAT (BLADE) ×2 IMPLANT
BLADE SAW 90X25X1.19 OSCILLAT (BLADE) ×2 IMPLANT
CANISTER PREVENA PLUS 150 (CANNISTER) ×2 IMPLANT
CANISTER SUCT 3000ML PPV (MISCELLANEOUS) ×2 IMPLANT
CEMENT HV SMART SET (Cement) ×2 IMPLANT
COOLER POLAR GLACIER W/PUMP (MISCELLANEOUS) ×2 IMPLANT
COVER WAND RF STERILE (DRAPES) ×2 IMPLANT
CUFF TOURN SGL QUICK 30 (TOURNIQUET CUFF) ×2
CUFF TRNQT CYL 30X4X21-28X (TOURNIQUET CUFF) IMPLANT
DRAPE 3/4 80X56 (DRAPES) ×2 IMPLANT
DRESSING PEEL AND PLAC PRVNA20 (GAUZE/BANDAGES/DRESSINGS) ×1 IMPLANT
DRSG DERMACEA 8X12 NADH (GAUZE/BANDAGES/DRESSINGS) ×2 IMPLANT
DRSG MEPILEX SACRM 8.7X9.8 (GAUZE/BANDAGES/DRESSINGS) ×2 IMPLANT
DRSG OPSITE POSTOP 4X12 (GAUZE/BANDAGES/DRESSINGS) ×1 IMPLANT
DRSG OPSITE POSTOP 4X14 (GAUZE/BANDAGES/DRESSINGS) ×2 IMPLANT
DRSG PEEL AND PLACE PREVENA 20 (GAUZE/BANDAGES/DRESSINGS) ×2
DRSG TEGADERM 4X4.75 (GAUZE/BANDAGES/DRESSINGS) ×2 IMPLANT
DURAPREP 26ML APPLICATOR (WOUND CARE) ×4 IMPLANT
ELECT REM PT RETURN 9FT ADLT (ELECTROSURGICAL) ×2
ELECTRODE REM PT RTRN 9FT ADLT (ELECTROSURGICAL) ×1 IMPLANT
EX-PIN ORTHOLOCK NAV 4X150 (PIN) ×4 IMPLANT
GLOVE SURG ENC MOIS LTX SZ7.5 (GLOVE) ×4 IMPLANT
GLOVE SURG ENC TEXT LTX SZ7.5 (GLOVE) ×4 IMPLANT
GLOVE SURG UNDER LTX SZ8 (GLOVE) ×2 IMPLANT
GLOVE SURG UNDER POLY LF SZ7.5 (GLOVE) ×2 IMPLANT
GOWN STRL REUS W/ TWL LRG LVL3 (GOWN DISPOSABLE) ×2 IMPLANT
GOWN STRL REUS W/ TWL XL LVL3 (GOWN DISPOSABLE) ×1 IMPLANT
GOWN STRL REUS W/TWL LRG LVL3 (GOWN DISPOSABLE) ×4
GOWN STRL REUS W/TWL XL LVL3 (GOWN DISPOSABLE) ×2
HEMOVAC 400CC 10FR (MISCELLANEOUS) ×2 IMPLANT
HOLDER FOLEY CATH W/STRAP (MISCELLANEOUS) ×2 IMPLANT
HOOD PEEL AWAY FLYTE STAYCOOL (MISCELLANEOUS) ×4 IMPLANT
IRRIGATION SURGIPHOR STRL (IV SOLUTION) ×2 IMPLANT
KIT PUMP PREVENA PLUS 14DAY (MISCELLANEOUS) ×2 IMPLANT
KIT TURNOVER KIT A (KITS) ×2 IMPLANT
KNIFE SCULPS 14X20 (INSTRUMENTS) ×2 IMPLANT
LABEL OR SOLS (LABEL) ×2 IMPLANT
MANIFOLD NEPTUNE II (INSTRUMENTS) ×4 IMPLANT
NDL SAFETY ECLIPSE 18X1.5 (NEEDLE) ×1 IMPLANT
NDL SPNL 20GX3.5 QUINCKE YW (NEEDLE) ×2 IMPLANT
NEEDLE HYPO 18GX1.5 SHARP (NEEDLE) ×2
NEEDLE SPNL 20GX3.5 QUINCKE YW (NEEDLE) ×4 IMPLANT
NS IRRIG 500ML POUR BTL (IV SOLUTION) ×2 IMPLANT
PACK TOTAL KNEE (MISCELLANEOUS) ×2 IMPLANT
PAD WRAPON POLAR KNEE (MISCELLANEOUS) ×1 IMPLANT
PATELLA MEDIAL ATTUN 35MM KNEE (Knees) ×1 IMPLANT
PENCIL SMOKE EVACUATOR (MISCELLANEOUS) ×2 IMPLANT
PIN DRILL QUICK PACK ×2 IMPLANT
PIN FIXATION 1/8DIA X 3INL (PIN) ×6 IMPLANT
PULSAVAC PLUS IRRIG FAN TIP (DISPOSABLE) ×2
SOL .9 NS 3000ML IRR  AL (IV SOLUTION) ×2
SOL .9 NS 3000ML IRR UROMATIC (IV SOLUTION) ×1 IMPLANT
SOL PREP PVP 2OZ (MISCELLANEOUS) ×2
SOLUTION PREP PVP 2OZ (MISCELLANEOUS) ×1 IMPLANT
SPONGE DRAIN TRACH 4X4 STRL 2S (GAUZE/BANDAGES/DRESSINGS) ×2 IMPLANT
STAPLER SKIN PROX 35W (STAPLE) ×2 IMPLANT
STOCKINETTE IMPERV 14X48 (MISCELLANEOUS) ×1 IMPLANT
STRAP TIBIA SHORT (MISCELLANEOUS) ×2 IMPLANT
SUCTION FRAZIER HANDLE 10FR (MISCELLANEOUS) ×2
SUCTION TUBE FRAZIER 10FR DISP (MISCELLANEOUS) ×1 IMPLANT
SUT VIC AB 0 CT1 36 (SUTURE) ×4 IMPLANT
SUT VIC AB 1 CT1 36 (SUTURE) ×4 IMPLANT
SUT VIC AB 2-0 CT2 27 (SUTURE) ×2 IMPLANT
SYR 20ML LL LF (SYRINGE) ×2 IMPLANT
SYR 30ML LL (SYRINGE) ×4 IMPLANT
TIP FAN IRRIG PULSAVAC PLUS (DISPOSABLE) ×1 IMPLANT
TOWEL OR 17X26 4PK STRL BLUE (TOWEL DISPOSABLE) ×2 IMPLANT
TOWER CARTRIDGE SMART MIX (DISPOSABLE) ×2 IMPLANT
TRAY FOLEY MTR SLVR 16FR STAT (SET/KITS/TRAYS/PACK) ×2 IMPLANT
WRAPON POLAR PAD KNEE (MISCELLANEOUS) ×2

## 2021-01-11 NOTE — Anesthesia Preprocedure Evaluation (Addendum)
Anesthesia Evaluation  Patient identified by MRN, date of birth, ID band Patient awake    Reviewed: Allergy & Precautions, NPO status , Patient's Chart, lab work & pertinent test results  History of Anesthesia Complications Negative for: history of anesthetic complications  Airway Mallampati: II  TM Distance: >3 FB Neck ROM: Full    Dental  (+) Upper Dentures, Teeth Intact   Pulmonary neg pulmonary ROS, neg sleep apnea, neg COPD, Patient abstained from smoking.Not current smoker,    Pulmonary exam normal breath sounds clear to auscultation       Cardiovascular Exercise Tolerance: Good METShypertension, + CAD  (-) Past MI (-) dysrhythmias + Valvular Problems/Murmurs MR  Rhythm:Regular Rate:Normal - Systolic murmurs LEXISCAN performed on 04/06/2020 1. LVEF 75% 2. Regional wall motion reveals normal myocardial thickening and wall motion 3. No artifact noted 4. Left ventricular cavity size normal 5. No evidence of stress-induced myocardial ischemia or arrhythmia 6. The overall quality of the study is good 7. Normal low risk STUDY  ECHOCARDIOGRAM performed on 03/27/2020 1. LVEF >55% 2. Normal left ventricular systolic function 3. Normal right ventricular systolic function 4. Trivial AR and PR 5. Moderate MR 6. Mild TR 7. No valvular stenosis 8. No evidence of a pericardial effusion    Neuro/Psych negative neurological ROS  negative psych ROS   GI/Hepatic GERD  Medicated and Controlled,(+)     (-) substance abuse  ,   Endo/Other  neg diabetes  Renal/GU negative Renal ROS     Musculoskeletal  (+) Arthritis ,   Abdominal   Peds  Hematology   Anesthesia Other Findings Past Medical History: No date: Anginal pain ([place]) No date: Aortic atherosclerosis (HCC) No date: Arthritis No date: Coronary artery disease 09/2019: COVID-19 No date: GERD (gastroesophageal reflux disease) No date: Heart murmur No date:  History of pulmonary embolus (PE) No date: Hyperlipidemia No date: Hypertension No date: Pneumonia  Reproductive/Obstetrics                            Anesthesia Physical Anesthesia Plan  ASA: II  Anesthesia Plan: General/Spinal   Post-op Pain Management:    Induction: Intravenous  PONV Risk Score and Plan: 3 and Ondansetron, Dexamethasone, Propofol infusion, TIVA and Treatment may vary due to age or medical condition  Airway Management Planned: Natural Airway  Additional Equipment: None  Intra-op Plan:   Post-operative Plan:   Informed Consent: I have reviewed the patients History and Physical, chart, labs and discussed the procedure including the risks, benefits and alternatives for the proposed anesthesia with the patient or authorized representative who has indicated his/her understanding and acceptance.       Plan Discussed with: CRNA and Surgeon  Anesthesia Plan Comments: (Discussed R/B/A of neuraxial anesthesia technique with patient: - rare risks of spinal/epidural hematoma, nerve damage, infection - Risk of PDPH - Risk of nausea and vomiting - Risk of conversion to general anesthesia and its associated risks, including sore throat, damage to lips/teeth/oropharynx, and rare risks such as cardiac and respiratory events.  Patient voiced understanding.)        Anesthesia Quick Evaluation

## 2021-01-11 NOTE — Evaluation (Signed)
Physical Therapy Evaluation Patient Details Name: Kiara Perry MRN: [id number] DOB: [birth year] Today's Date: 01/11/2021   History of Present Illness  DARTHULA DESA is a 86 y.o. female s/p L TKA on 01/11/21. Patient has never been a smoker. Pertinent PMH includes: CAD, angina, cardiac murmur, aortic atherosclerosis, HTN, HLD, DVT/PE, GERD (on daily PPI), OA.    Clinical Impression  Pt received in Semi-Fowler's position and agreeable to therapy.  Pt's husband was in room for start of session and son was in for conclusion of therapy.  Pt was able to perform bed-level exercises with good technique and decreasing difficulty as therapy progressed.  Pt performed bed mobility with use of verbal cuing for hand placement and was ablet o sit upright with hands unsupported during balance assessment.  Pt then transferred to standing with use of FWW and verbal cues for hand placement.  Pt with good weight shifting during standing assessment and denies pain upon initial stance.  Pt does note increasing pain with ambulation, reaching 7/10 upon conclusion.  Pt able to navigate around the nursing station and back to room where pt was left with son in the room and all needs met.  Nursing staff present in room upon leaving and call bell placed within reach.  Pt and son were agreeable to d/c and rehab process to home with HHPT.  Pt will benefit from skilled PT intervention to increase independence and safety with basic mobility in preparation for discharge to the venue listed below.       Follow Up Recommendations Home health PT;Supervision for mobility/OOB    Equipment Recommendations  Rolling walker with 5" wheels    Recommendations for Other Services       Precautions / Restrictions        Mobility  Bed Mobility Overal bed mobility: Modified Independent             General bed mobility comments: Pt able to utilize extra time for bed transfer and needing CGA for safety only.     Transfers Overall transfer level: Needs assistance Equipment used: Rolling walker (2 wheeled) Transfers: Sit to/from Stand Sit to Stand: Min guard         General transfer comment: Extra time for transfer utilized and CGA provided for safety.  Ambulation/Gait Ambulation/Gait assistance: Min guard Gait Distance (Feet): 180 Feet Assistive device: Rolling walker (2 wheeled) Gait Pattern/deviations: Step-to pattern;Decreased step length - right;Decreased stance time - left;Decreased stride length Gait velocity: decreased   General Gait Details: Pt ambulates well, places good amount of weight through UE's for offloading of L LE.  Stairs            Wheelchair Mobility    Modified Rankin (Stroke Patients Only)       Balance Overall balance assessment: Modified Independent                                           Pertinent Vitals/Pain Pain Assessment: 0-10 Pain Score: 7  Pain Location: L Posterior Knee Pain Descriptors / Indicators: Aching Pain Intervention(s): Limited activity within patient's tolerance;Monitored during session;Repositioned;Ice applied    Home Living Family/patient expects to be discharged to:: Private residence Living Arrangements: Spouse/significant other Available Help at Discharge: Family Type of Home: House Home Access: Stairs to enter Entrance Stairs-Rails: Left Entrance Stairs-Number of Steps: 4 Home Layout: One level Home Equipment: Shower seat - built  in;Walker - 2 wheels;Cane - single point;Grab bars - tub/shower;Hand held shower head      Prior Function Level of Independence: Independent with assistive device(s)         Comments: Utilized a SPC to mobilize around the community.     Hand Dominance   Dominant Hand: Right    Extremity/Trunk Assessment   Upper Extremity Assessment Upper Extremity Assessment: Overall WFL for tasks assessed    Lower Extremity Assessment Lower Extremity Assessment: LLE  deficits/detail LLE Deficits / Details: Weakness s/p L TKA LLE Sensation: WNL       Communication   Communication: No difficulties  Cognition Arousal/Alertness: Awake/alert Behavior During Therapy: WFL for tasks assessed/performed Overall Cognitive Status: Within Functional Limits for tasks assessed                                        General Comments      Exercises Total Joint Exercises Ankle Circles/Pumps: AROM;Strengthening;Both;10 reps;Supine Quad Sets: AROM;Strengthening;Both;10 reps;Supine Gluteal Sets: AROM;Strengthening;Both;10 reps;Supine Heel Slides: AROM;Strengthening;Both;10 reps;Supine Hip ABduction/ADduction: AROM;Strengthening;Both;10 reps;Supine Straight Leg Raises: AROM;Strengthening;Both;10 reps;Supine Marching in Standing: AROM;Strengthening;Both;10 reps;Standing Other Exercises Other Exercises: Pt, husband, and son were educated on roles of PT and services provided during hospital stay.  Pt educated on bone foam, rehab process, and physiological benefits of exercise in between bouts of PT sessions.   Assessment/Plan    PT Assessment Patient needs continued PT services  PT Problem List Decreased strength;Decreased range of motion;Decreased activity tolerance;Decreased balance;Decreased mobility;Decreased knowledge of use of DME;Pain       PT Treatment Interventions DME instruction;Gait training;Stair training;Functional mobility training;Therapeutic activities;Therapeutic exercise;Balance training;Patient/family education    PT Goals (Current goals can be found in the Care Plan section)  Acute Rehab PT Goals Patient Stated Goal: to go home and get stronger. PT Goal Formulation: With patient/family Time For Goal Achievement: 01/25/21 Potential to Achieve Goals: Good    Frequency BID   Barriers to discharge        Co-evaluation               AM-PAC PT "6 Clicks" Mobility  Outcome Measure Help needed turning from your  back to your side while in a flat bed without using bedrails?: A Little Help needed moving from lying on your back to sitting on the side of a flat bed without using bedrails?: A Little Help needed moving to and from a bed to a chair (including a wheelchair)?: A Little Help needed standing up from a chair using your arms (e.g., wheelchair or bedside chair)?: A Little Help needed to walk in hospital room?: A Little Help needed climbing 3-5 steps with a railing? : A Lot 6 Click Score: 17    End of Session Equipment Utilized During Treatment: Gait belt Activity Tolerance: Patient tolerated treatment well Patient left: in bed;with call bell/phone within reach;with family/visitor present;with SCD's reapplied Nurse Communication: Mobility status PT Visit Diagnosis: Unsteadiness on feet (R26.81);Other abnormalities of gait and mobility (R26.89);Muscle weakness (generalized) (M62.81);Difficulty in walking, not elsewhere classified (R26.2);Pain Pain - Right/Left: Left Pain - part of body: Knee    Time: 7829-5621 PT Time Calculation (min) (ACUTE ONLY): 67 min   Charges:   PT Evaluation $PT Eval Low Complexity: 1 Low PT Treatments $Gait Training: 23-37 mins $Therapeutic Exercise: 23-37 mins        Gwenlyn Saran, PT, DPT 01/11/21, 6:21 PM

## 2021-01-11 NOTE — H&P (Signed)
The patient has been re-examined, and the chart reviewed, and there have been no interval changes to the documented history and physical.    The risks, benefits, and alternatives have been discussed at length. The patient expressed understanding of the risks benefits and agreed with plans for surgical intervention.  Quynh Basso P. Kannan Proia, Jr. M.D.    

## 2021-01-11 NOTE — Anesthesia Procedure Notes (Signed)
Spinal  Patient location during procedure: OR Start time: 01/11/2021 11:45 AM End time: 01/11/2021 11:53 AM Reason for block: surgical anesthesia Staffing Performed: anesthesiologist  Anesthesiologist: Arita Miss, MD Resident/CRNA: Aline Brochure, CRNA Other anesthesia staff: Martie Round, RN Preanesthetic Checklist Completed: patient identified, IV checked, site marked, risks and benefits discussed, surgical consent, monitors and equipment checked, pre-op evaluation and timeout performed Spinal Block Patient position: sitting Prep: ChloraPrep Patient monitoring: heart rate, continuous pulse ox, blood pressure and cardiac monitor Approach: midline Location: L3-4 Injection technique: single-shot Needle Needle type: Introducer and Pencan  Needle gauge: 24 G Needle length: 9 cm Assessment Sensory level: T10 Events: CSF return Additional Notes Negative paresthesia. Negative blood return. Positive free-flowing CSF. Expiration date of kit checked and confirmed. Patient tolerated procedure well, without complications.

## 2021-01-11 NOTE — Transfer of Care (Signed)
Immediate Anesthesia Transfer of Care Note  Patient: Kiara Perry  Procedure(s) Performed: COMPUTER ASSISTED TOTAL KNEE ARTHROPLASTY - RNFA (Left Knee)  Patient Location: PACU  Anesthesia Type:Spinal  Level of Consciousness: awake and alert   Airway & Oxygen Therapy: Patient Spontanous Breathing and Patient connected to face mask oxygen  Post-op Assessment: Report given to RN and Post -op Vital signs reviewed and stable  Post vital signs: Reviewed and stable  Last Vitals:  Vitals Value Taken Time  BP 93/69 01/11/21 1520  Temp    Pulse 71 01/11/21 1520  Resp 18 01/11/21 1520  SpO2 98 % 01/11/21 1520    Last Pain:  Vitals:   01/11/21 1003  TempSrc: Tympanic  PainSc: 0-No pain      Patients Stated Pain Goal: 0 (61/60/73 7106)  Complications: No complications documented.

## 2021-01-11 NOTE — Plan of Care (Signed)

## 2021-01-11 NOTE — Op Note (Signed)
OPERATIVE NOTE  DATE OF SURGERY:  01/11/2021  PATIENT NAME:  Kiara Perry   DOB: 1935/03/15  MRN: [id number]  PRE-OPERATIVE DIAGNOSIS: Degenerative arthrosis of the left knee, primary  POST-OPERATIVE DIAGNOSIS:  Same  PROCEDURE:  Left total knee arthroplasty using computer-assisted navigation  SURGEON:  Marciano Sequin. M.D.  ASSISTANT: Cassell Smiles, PA-C (present and scrubbed throughout the case, critical for assistance with exposure, retraction, instrumentation, and closure)  ANESTHESIA: spinal  ESTIMATED BLOOD LOSS: 50 mL  FLUIDS REPLACED: 900 mL of crystalloid  TOURNIQUET TIME: 88 minutes  DRAINS: 2 medium Hemovac drains  SOFT TISSUE RELEASES: Anterior cruciate ligament, posterior cruciate ligament, deep medial collateral ligament, patellofemoral ligament  IMPLANTS UTILIZED: DePuy Attune size 4 posterior stabilized femoral component (cemented), size 4 rotating platform tibial component (cemented), 35 mm medialized dome patella (cemented), and a 6 mm stabilized rotating platform polyethylene insert.  INDICATIONS FOR SURGERY: Kiara Perry is a 86 y.o. year old female with a long history of progressive knee pain. X-rays demonstrated severe degenerative changes in tricompartmental fashion. The patient had not seen any significant improvement despite conservative nonsurgical intervention. After discussion of the risks and benefits of surgical intervention, the patient expressed understanding of the risks benefits and agree with plans for total knee arthroplasty.   The risks, benefits, and alternatives were discussed at length including but not limited to the risks of infection, bleeding, nerve injury, stiffness, blood clots, the need for revision surgery, cardiopulmonary complications, among others, and they were willing to proceed.  PROCEDURE IN DETAIL: The patient was brought into the operating room and, after adequate spinal anesthesia was achieved, a tourniquet was  placed on the patient's upper thigh. The patient's knee and leg were cleaned and prepped with alcohol and DuraPrep and draped in the usual sterile fashion. A "timeout" was performed as per usual protocol. The lower extremity was exsanguinated using an Esmarch, and the tourniquet was inflated to 300 mmHg. An anterior longitudinal incision was made followed by a standard mid vastus approach. The deep fibers of the medial collateral ligament were elevated in a subperiosteal fashion off of the medial flare of the tibia so as to maintain a continuous soft tissue sleeve. The patella was subluxed laterally and the patellofemoral ligament was incised. Inspection of the knee demonstrated severe degenerative changes with full-thickness loss of articular cartilage. Osteophytes were debrided using a rongeur. Anterior and posterior cruciate ligaments were excised. Two 4.0 mm Schanz pins were inserted in the femur and into the tibia for attachment of the array of trackers used for computer-assisted navigation. Hip center was identified using a circumduction technique. Distal landmarks were mapped using the computer. The distal femur and proximal tibia were mapped using the computer. The distal femoral cutting guide was positioned using computer-assisted navigation so as to achieve a 5 distal valgus cut. The femur was sized and it was felt that a size 4 femoral component was appropriate. A size 4 femoral cutting guide was positioned and the anterior cut was performed and verified using the computer. This was followed by completion of the posterior and chamfer cuts. Femoral cutting guide for the central box was then positioned in the center box cut was performed.  Attention was then directed to the proximal tibia. Medial and lateral menisci were excised. The extramedullary tibial cutting guide was positioned using computer-assisted navigation so as to achieve a 0 varus-valgus alignment and 3 posterior slope. The cut was  performed and verified using the computer. The proximal tibia was  sized and it was felt that a size 4 tibial tray was appropriate. Tibial and femoral trials were inserted followed by insertion of a 6 mm polyethylene insert. This allowed for excellent mediolateral soft tissue balancing both in flexion and in full extension. Finally, the patella was cut and prepared so as to accommodate a 35 mm medialized dome patella. A patella trial was placed and the knee was placed through a range of motion with excellent patellar tracking appreciated. The femoral trial was removed after debridement of posterior osteophytes. The central post-hole for the tibial component was reamed followed by insertion of a keel punch. Tibial trials were then removed. Cut surfaces of bone were irrigated with copious amounts of normal saline using pulsatile lavage and then suctioned dry. Polymethylmethacrylate cement was prepared in the usual fashion using a vacuum mixer. Cement was applied to the cut surface of the proximal tibia as well as along the undersurface of a size 4 rotating platform tibial component. Tibial component was positioned and impacted into place. Excess cement was removed using [employer] fast streamer. Cement was then applied to the cut surfaces of the femur as well as along the posterior flanges of the size 4 femoral component. The femoral component was positioned and impacted into place. Excess cement was removed using [employer] fast streamer. A 6 mm polyethylene trial was inserted and the knee was brought into full extension with steady axial compression applied. Finally, cement was applied to the backside of a 35 mm medialized dome patella and the patellar component was positioned and patellar clamp applied. Excess cement was removed using [employer] fast streamer. After adequate curing of the cement, the tourniquet was deflated after a total tourniquet time of 88 minutes. Hemostasis was achieved using electrocautery. The knee was irrigated with  copious amounts of normal saline using pulsatile lavage followed by 500 ml of Surgiphor and then suctioned dry. 20 mL of 1.3% Exparel and 60 mL of 0.25% Marcaine in 40 mL of normal saline was injected along the posterior capsule, medial and lateral gutters, and along the arthrotomy site. A 6 mm stabilized rotating platform polyethylene insert was inserted and the knee was placed through a range of motion with excellent mediolateral soft tissue balancing appreciated and excellent patellar tracking noted. 2 medium drains were placed in the wound bed and brought out through separate stab incisions. The medial parapatellar portion of the incision was reapproximated using interrupted sutures of #1 Vicryl. Subcutaneous tissue was approximated in layers using first #0 Vicryl followed #2-0 Vicryl. The skin was approximated with skin staples. A sterile dressing was applied.  The patient tolerated the procedure well and was transported to the recovery room in stable condition.    Jamont Mellin P. Holley Bouche., M.D.

## 2021-01-11 NOTE — Anesthesia Procedure Notes (Signed)
Procedure Name: MAC Date/Time: 01/11/2021 11:55 AM Performed by: Aline Brochure, CRNA Pre-anesthesia Checklist: Patient identified, Emergency Drugs available, Suction available, Patient being monitored and Timeout performed Oxygen Delivery Method: Simple face mask

## 2021-01-11 NOTE — Anesthesia Postprocedure Evaluation (Signed)
Anesthesia Post Note  Patient: Kiara Perry  Procedure(s) Performed: COMPUTER ASSISTED TOTAL KNEE ARTHROPLASTY - RNFA (Left Knee)  Patient location during evaluation: PACU Anesthesia Type: Spinal Level of consciousness: oriented and awake and alert Pain management: pain level controlled Vital Signs Assessment: post-procedure vital signs reviewed and stable Respiratory status: spontaneous breathing, respiratory function stable and patient connected to nasal cannula oxygen Cardiovascular status: blood pressure returned to baseline and stable Postop Assessment: no headache, no backache and no apparent nausea or vomiting Anesthetic complications: no   No complications documented.   Last Vitals:  Vitals:   01/11/21 1003 01/11/21 1520  BP: (!) 162/73 93/69  Pulse: (!) 56 71  Resp: 18 18  Temp: 36.7 C   SpO2: 100% 98%    Last Pain:  Vitals:   01/11/21 1003  TempSrc: Tympanic  PainSc: 0-No pain                 Arita Miss

## 2021-01-12 ENCOUNTER — Encounter: Payer: Self-pay | Admitting: Orthopedic Surgery

## 2021-01-12 MED ORDER — OXYCODONE HCL 5 MG PO TABS
5.0000 mg | ORAL_TABLET | ORAL | Status: DC | PRN
Start: 2021-01-12 — End: 2021-01-12

## 2021-01-12 MED ORDER — CELECOXIB 200 MG PO CAPS: 200.0000 mg | ORAL_CAPSULE | Freq: Two times a day (BID) | ORAL | 0 refills | Status: DC

## 2021-01-12 MED ORDER — OXYCODONE HCL 5 MG PO TABS
10.0000 mg | ORAL_TABLET | ORAL | Status: DC | PRN
  Administered 2021-01-12: 10 mg via ORAL
  Filled 2021-01-12: qty 2

## 2021-01-12 MED ORDER — ENOXAPARIN SODIUM 40 MG/0.4ML ~~LOC~~ SOLN: 40.0000 mg | SUBCUTANEOUS | 0 refills | Status: DC

## 2021-01-12 MED ORDER — OXYCODONE HCL 5 MG PO TABS: 5.0000 mg | ORAL_TABLET | ORAL | 0 refills | Status: DC | PRN

## 2021-01-12 NOTE — Discharge Summary (Signed)
Physician Discharge Summary  Patient ID: ILETA OFARRELL MRN: [id number] DOB/AGE: Dec 17, 1934 86 y.o.  Admit date: 01/11/2021 Discharge date: 01/12/2021  Admission Diagnoses:  Total knee replacement status [Z96.659]  Surgeries:Procedure(s):  Left total knee arthroplasty using computer-assisted navigation  SURGEON:  Marciano Sequin. M.D.  ASSISTANT: Cassell Smiles, PA-C (present and scrubbed throughout the case, critical for assistance with exposure, retraction, instrumentation, and closure)  ANESTHESIA: spinal  ESTIMATED BLOOD LOSS: 50 mL  FLUIDS REPLACED: 900 mL of crystalloid  TOURNIQUET TIME: 88 minutes  DRAINS: 2 medium Hemovac drains  SOFT TISSUE RELEASES: Anterior cruciate ligament, posterior cruciate ligament, deep medial collateral ligament, patellofemoral ligament  IMPLANTS UTILIZED: DePuy Attune size 4 posterior stabilized femoral component (cemented), size 4 rotating platform tibial component (cemented), 35 mm medialized dome patella (cemented), and a 6 mm stabilized rotating platform polyethylene insert.  Discharge Diagnoses: Patient Active Problem List   Diagnosis Date Noted  . Total knee replacement status 01/11/2021  . History of pulmonary embolus (PE) 09/22/2020  . Chronic insomnia 06/24/2019  . Hypertension 06/24/2019  . Osteoarthritis 06/24/2019  . Osteoporosis 06/24/2019  . Venous ulcer of ankle, left ([place]) 06/17/2019  . Lymphedema 04/21/2019  . Venous insufficiency 04/21/2019  . GERD (gastroesophageal reflux disease) 04/21/2019  . Hyperlipidemia 04/21/2019  . Edema of both legs 03/25/2019  . Bradycardia 02/18/2019  . Degenerative lumbar spinal stenosis 09/17/2018  . Atherosclerosis of abdominal aorta ([place]) 07/27/2018  . Coronary artery disease involving native coronary artery of native heart 07/27/2018  . Coronary artery calcification 07/20/2018  . Acute left-sided low back pain with left-sided sciatica 05/19/2017  . Primary  osteoarthritis of left knee 05/19/2017  . SOBOE (shortness of breath on exertion) 12/08/2016    Past Medical History:  Diagnosis Date  . Anginal pain ([place])   . Aortic atherosclerosis ([place])   . Arthritis   . Coronary artery disease   . COVID-19 09/2019  . GERD (gastroesophageal reflux disease)   . Heart murmur   . History of pulmonary embolus (PE)   . Hyperlipidemia   . Hypertension   . Pneumonia      Transfusion:    Consultants (if any):   Discharged Condition: Improved  Hospital Course: NALAYAH HITT is an 86 y.o. female who was admitted 01/11/2021 with a diagnosis of left knee osteoarthritis and went to the operating room on 01/11/2021 and underwent left total knee arthroplasty. The patient received perioperative antibiotics for prophylaxis (see below). The patient tolerated the procedure well and was transported to PACU in stable condition. After meeting PACU criteria, the patient was subsequently transferred to the Orthopaedics/Rehabilitation unit.   The patient received DVT prophylaxis in the form of early mobilization, Lovenox, Foot Pumps and TED hose. A sacral pad had been placed and heels were elevated off of the bed with rolled towels in order to protect skin integrity. Foley catheter was discontinued on postoperative day #0. Wound drains were discontinued on postoperative day #1. The surgical incision was healing well without signs of infection.  Physical therapy was initiated postoperatively for transfers, gait training, and strengthening. Occupational therapy was initiated for activities of daily living and evaluation for assisted devices. Rehabilitation goals were reviewed in detail with the patient. The patient made steady progress with physical therapy and physical therapy recommended discharge to Home.   The patient achieved the preliminary goals of this hospitalization and was felt to be medically and orthopaedically appropriate for discharge.  She was given  perioperative antibiotics:  Anti-infectives (From admission, onward)  Start     Dose/Rate Route Frequency Ordered Stop   01/11/21 2000  ceFAZolin (ANCEF) IVPB 2g/100 mL premix        2 g 200 mL/hr over 30 Minutes Intravenous Every 8 hours 01/11/21 1644 01/12/21 0730   01/11/21 0950  ceFAZolin (ANCEF) 2-4 GM/100ML-% IVPB       Note to Pharmacy: [place] [facility], [facility]: cabinet override      01/11/21 0950 01/11/21 1158   01/11/21 0600  ceFAZolin (ANCEF) IVPB 2g/100 mL premix        2 g 200 mL/hr over 30 Minutes Intravenous On call to O.R. 01/10/21 2352 01/11/21 1207    .  Recent vital signs:  Vitals:   01/12/21 1144 01/12/21 1627  BP: (!) 119/48 (!) 109/56  Pulse: (!) 58 (!) 51  Resp: 15 15  Temp: 98.6 F (37 C) 97.9 F (36.6 C)  SpO2: 100% 99%    Recent laboratory studies:  No results for input(s): WBC, HGB, HCT, PLT, K, CL, CO2, BUN, CREATININE, GLUCOSE, CALCIUM, LABPT, INR in the last 72 hours.  Diagnostic Studies: DG Knee Left Port  Result Date: 01/11/2021 CLINICAL DATA:  Status post total knee replacement EXAM: PORTABLE LEFT KNEE - 1-2 VIEW COMPARISON:  None. FINDINGS: Frontal and lateral views were obtained. Patient is status post total knee replacement with prosthetic components well-seated. No fracture or dislocation. No erosive change. A drain is present within the knee joint placed from a superior approach. There are skin staples anteriorly. IMPRESSION: Status post total knee replacement with prosthetic components well-seated. No fracture or dislocation. Acute postoperative changes noted. Electronically Signed   By: Lowella Grip III M.D.   On: 01/11/2021 16:08    Discharge Medications:   Allergies as of 01/12/2021      Reactions   Levofloxacin Nausea Only      Medication List    STOP taking these medications   aspirin EC 81 MG tablet     TAKE these medications   bisoprolol 5 MG tablet Commonly known as: ZEBETA Take 5 mg by mouth daily.   Calcium  Carbonate-Vitamin D 600-400 MG-UNIT tablet Take 1 tablet by mouth daily.      enoxaparin 40 MG/0.4ML injection Commonly known as: LOVENOX Inject 0.4 mLs (40 mg total) into the skin daily for 14 days.   gabapentin 100 MG capsule Commonly known as: NEURONTIN Take 100 mg by mouth at bedtime.   hydrALAZINE 25 MG tablet Commonly known as: APRESOLINE Take 25 mg by mouth at bedtime.   lovastatin 40 MG tablet Commonly known as: MEVACOR Take 80 mg by mouth at bedtime.   omeprazole 40 MG capsule Commonly known as: PRILOSEC Take 40 mg by mouth daily.   oxyCODONE 5 MG immediate release tablet Commonly known as: Oxy IR/ROXICODONE Take 1 tablet (5 mg total) by mouth every 4 (four) hours as needed for moderate pain.   telmisartan 40 MG tablet Commonly known as: MICARDIS Take 40 mg by mouth daily.   torsemide 10 MG tablet Commonly known as: DEMADEX Take 10 mg by mouth daily.   traMADol 50 MG tablet Commonly known as: ULTRAM Take 50 mg by mouth every 6 (six) hours as needed for moderate pain. for pain            Durable Medical Equipment  (From admission, onward)         Start     Ordered   01/11/21 1645  DME Walker rolling  Once  Question:  Patient needs a walker to treat with the following condition  Answer:  Total knee replacement status   01/11/21 1644   01/11/21 1645  DME Bedside commode  Once       Question:  Patient needs a bedside commode to treat with the following condition  Answer:  Total knee replacement status   01/11/21 1644         Disposition: home with home health PT      Follow-up Information    [facility] On 01/26/2021.   Specialty: Orthopedic Surgery Why: at 9:45am Contact information: [place] [address] [phone]        Dereck Leep, MD On 02/23/2021.   Specialty: Orthopedic Surgery Why: at 9:45am Contact information: [place] [id number] [place], PA-C 01/12/2021, 5:21 PM

## 2021-01-12 NOTE — Progress Notes (Signed)
Physical Therapy Treatment Patient Details Name: Kiara Perry MRN: [id number] DOB: 18-Dec-1934 Today's Date: 01/12/2021    History of Present Illness Kiara Perry is a 86 y.o. female s/p L TKA on 01/11/21. Patient has never been a smoker. Pertinent PMH includes: CAD, angina, cardiac murmur, aortic atherosclerosis, HTN, HLD, DVT/PE, GERD (on daily PPI), OA.    PT Comments    Pt received in Semi-Fowler's position and agreeable to therapy.  Pt notes 7/10 pain on the lateral aspect of her L knee.  Pt notes that she did not sleep well, but kept her leg in the zero degree bone foam for a majority of the night.  Pt also reports performing exercises prior to session.  Pt is moving much better today with bed mobility.  Pt is able to transfer with supervision and navigate into standing before ambulation.  Pt performed stair training in rehab gym.  Pt has difficulty with safety precautions at first, not responding to therapist with where the walker should be before attempting the next step.  Therapist asked the pt to slow down before going to the next step to make sure she was performing correctly.  Once pt slowed down, she was able to demonstrate adequate recall of how to perform.  Pt then ambulated back to room where she transferred to the recliner with all needs met.  Current discharge plans to home with HHPT remain appropriate at this time.  Pt will continue to benefit from skilled therapy in order to address deficits listed below.      Follow Up Recommendations  Home health PT;Supervision for mobility/OOB     Equipment Recommendations  Rolling walker with 5" wheels    Recommendations for Other Services       Precautions / Restrictions Precautions Precautions: Knee Restrictions Weight Bearing Restrictions: Yes LLE Weight Bearing: Weight bearing as tolerated    Mobility  Bed Mobility Overal bed mobility: Modified Independent                  Transfers Overall transfer  level: Needs assistance Equipment used: Rolling walker (2 wheeled) Transfers: Sit to/from Stand Sit to Stand: Supervision         General transfer comment: Supervision and managment of lines/leads utilized during session  Ambulation/Gait Ambulation/Gait assistance: Min guard Gait Distance (Feet): 220 Feet Assistive device: Rolling walker (2 wheeled) Gait Pattern/deviations: Step-to pattern;Decreased step length - right;Decreased stance time - left;Decreased stride length Gait velocity: decreased   General Gait Details: Pt ambulates well, places good amount of weight through UE's for offloading of L LE.   Stairs Stairs: Yes Stairs assistance: Min guard Stair Management: No rails;Backwards;With walker Number of Stairs: 12 General stair comments: Pt performs well, however gets confused with placement of walker on the first step.  Otherwise, pt performs well.  attempted 4 steps x 3.   Wheelchair Mobility    Modified Rankin (Stroke Patients Only)       Balance Overall balance assessment: Modified Independent                                          Cognition Arousal/Alertness: Awake/alert Behavior During Therapy: WFL for tasks assessed/performed Overall Cognitive Status: Within Functional Limits for tasks assessed  Exercises      General Comments        Pertinent Vitals/Pain Pain Assessment: 0-10 Pain Score: 7  Pain Location: L Posterior Knee Pain Descriptors / Indicators: Aching Pain Intervention(s): Monitored during session;Premedicated before session;Repositioned;Ice applied    Home Living                      Prior Function            PT Goals (current goals can now be found in the care plan section) Acute Rehab PT Goals Patient Stated Goal: to go home and get stronger. PT Goal Formulation: With patient/family Time For Goal Achievement: 01/25/21 Potential to Achieve  Goals: Good Progress towards PT goals: Progressing toward goals    Frequency    BID      PT Plan Current plan remains appropriate    Co-evaluation              AM-PAC PT "6 Clicks" Mobility   Outcome Measure  Help needed turning from your back to your side while in a flat bed without using bedrails?: A Little Help needed moving from lying on your back to sitting on the side of a flat bed without using bedrails?: A Little Help needed moving to and from a bed to a chair (including a wheelchair)?: A Little Help needed standing up from a chair using your arms (e.g., wheelchair or bedside chair)?: A Little Help needed to walk in hospital room?: A Little Help needed climbing 3-5 steps with a railing? : A Little 6 Click Score: 18    End of Session Equipment Utilized During Treatment: Gait belt Activity Tolerance: Patient tolerated treatment well Patient left: in chair;with call bell/phone within reach;with chair alarm set;with family/visitor present Nurse Communication: Mobility status PT Visit Diagnosis: Unsteadiness on feet (R26.81);Other abnormalities of gait and mobility (R26.89);Muscle weakness (generalized) (M62.81);Difficulty in walking, not elsewhere classified (R26.2);Pain Pain - Right/Left: Left Pain - part of body: Knee     Time: 0321-2248 PT Time Calculation (min) (ACUTE ONLY): 32 min  Charges:  $Gait Training: 23-37 mins                    Gwenlyn Saran, PT, DPT 01/12/21, 12:18 PM

## 2021-01-12 NOTE — Progress Notes (Addendum)
  Subjective: 1 Day Post-Op Procedure(s) (LRB): COMPUTER ASSISTED TOTAL KNEE ARTHROPLASTY - RNFA (Left) Patient reports pain as moderate.   She states she is having some pain down the side of the leg. Patient is otherwise doing well. Plan is to go Home after hospital stay. Negative for chest pain and shortness of breath Fever: no Gastrointestinal: negative for nausea and vomiting.   Patient has not had a bowel movement.  Objective: Vital signs in last 24 hours: Temp:  [97.3 F (36.3 C)-98.2 F (36.8 C)] 98.2 F (36.8 C) (03/22 0812) Pulse Rate:  [55-71] 55 (03/22 0812) Resp:  [15-18] 15 (03/22 0812) BP: (93-162)/(53-73) 122/54 (03/22 0812) SpO2:  [98 %-100 %] 100 % (03/22 0812) Weight:  [77.1 kg] 77.1 kg (03/21 1003)  Intake/Output from previous day:  Intake/Output Summary (Last 24 hours) at 01/12/2021 0910 Last data filed at 01/12/2021 0540 Gross per 24 hour  Intake 1568 ml  Output 542 ml  Net 1026 ml    Intake/Output this shift: No intake/output data recorded.  Labs: No results for input(s): HGB in the last 72 hours. No results for input(s): WBC, RBC, HCT, PLT in the last 72 hours. No results for input(s): NA, K, CL, CO2, BUN, CREATININE, GLUCOSE, CALCIUM in the last 72 hours. No results for input(s): LABPT, INR in the last 72 hours.   EXAM General - Patient is Alert, Appropriate and Oriented Extremity - Neurovascular intact Dorsiflexion/Plantar flexion intact Compartment soft Dressing/Incision -Postoperative dressing remains in place., Polar Care in place and working. , Hemovac in place.  Motor Function - intact, moving foot and toes well on exam.  Cardiovascular- bradycardic rate, normal rhythm without m/r/g Respiratory- Lungs clear to auscultation bilaterally Gastrointestinal- soft, nontender and active bowel sounds   Assessment/Plan: 1 Day Post-Op Procedure(s) (LRB): COMPUTER ASSISTED TOTAL KNEE ARTHROPLASTY - RNFA (Left) Active Problems:   Total knee  replacement status  Estimated body mass index is 27.44 kg/m as calculated from the following:   Height as of this encounter: 5\' 6"  (1.676 m).   Weight as of this encounter: 77.1 kg. Advance diet Up with therapy   Brief post-operative course reviewed with patient. Anticipate returning this PM to remove hemovac and post-op dressing in preparation for discharge home.   Order placed for oxycodone 5 mg q4h PRN pain.  DVT Prophylaxis - Lovenox, Ted hose and foot pumps Weight-Bearing as tolerated to left leg  Cassell Smiles, PA-C [facility] Orthopaedic Surgery 01/12/2021, 9:10 AM

## 2021-01-12 NOTE — Evaluation (Signed)
Occupational Therapy Evaluation Patient Details Name: Kiara Perry MRN: [id number] DOB: 17-Apr-1935 Today's Date: 01/12/2021    History of Present Illness Kiara Perry is a 86 y.o. female s/p L TKA on 01/11/21. Patient has never been a smoker. Pertinent PMH includes: CAD, angina, cardiac murmur, aortic atherosclerosis, HTN, HLD, DVT/PE, GERD (on daily PPI), OA.   Clinical Impression   Pt seen for OT evaluation this date, POD#1 from above surgery. Pt was independent in all ADLs prior to surgery, however occasionally using a SPC for community ambulation due to L knee pain. Pt is eager to return to PLOF with less pain. Pt currently requires ModA for LB dressing while in seated position due to pain and limited AROM of L knee. Pt and spouse instructed in polar care mgt, falls prevention strategies, home/routines modifications, DME/AE for LB bathing and dressing tasks, and compression stocking mgt. They verbalize understanding and provide teach-back. Pt states she does not need any follow-up OT services at this time. Do not currently anticipate any OT needs following this hospitalization.      Follow Up Recommendations  No OT follow up    Equipment Recommendations       Recommendations for Other Services       Precautions / Restrictions Precautions Precautions: Knee Restrictions Weight Bearing Restrictions: Yes LLE Weight Bearing: Weight bearing as tolerated      Mobility Bed Mobility Overal bed mobility: Modified Independent                  Transfers Overall transfer level: Needs assistance Equipment used: Rolling walker (2 wheeled) Transfers: Sit to/from Stand Sit to Stand: Supervision         General transfer comment: Supervision and managment of lines/leads during session    Balance Overall balance assessment: Modified Independent                                         ADL either performed or assessed with clinical judgement   ADL  Overall ADL's : Needs assistance/impaired Eating/Feeding: Independent   Grooming: Independent               Lower Body Dressing: Moderate assistance   Toilet Transfer: Supervision/safety;RW                   Vision Baseline Vision/History: Wears glasses Wears Glasses: Reading only Patient Visual Report: No change from baseline       Perception     Praxis      Pertinent Vitals/Pain Pain Assessment: 0-10 Pain Score: 7  Pain Location: L Posterior Knee Pain Descriptors / Indicators: Aching Pain Intervention(s): Limited activity within patient's tolerance;Monitored during session;Repositioned;Ice applied     Hand Dominance Right   Extremity/Trunk Assessment Upper Extremity Assessment Upper Extremity Assessment: Overall WFL for tasks assessed   Lower Extremity Assessment Lower Extremity Assessment: LLE deficits/detail LLE Deficits / Details: Weakness s/p L TKA LLE Sensation: WNL       Communication Communication Communication: No difficulties   Cognition Arousal/Alertness: Awake/alert Behavior During Therapy: WFL for tasks assessed/performed Overall Cognitive Status: Within Functional Limits for tasks assessed                                     General Comments  pt reports carpal tunnel flair-up in R UE, wearing  brace    Exercises Other Exercises Other Exercises: Provided educ to pt and spouse re: polar care mgmt, TED hose mgmt, AE for LB dressing/bathing   Shoulder Instructions      Home Living Family/patient expects to be discharged to:: Private residence Living Arrangements: Spouse/significant other Available Help at Discharge: Family Type of Home: House Home Access: Stairs to enter Technical brewer of Steps: 4 Entrance Stairs-Rails: Left Home Layout: One level     Bathroom Shower/Tub: Occupational psychologist: Handicapped height Bathroom Accessibility: Yes   Home Equipment: Shower seat - built in;Walker  - 2 wheels;Cane - single point;Grab bars - tub/shower;Hand held shower head          Prior Functioning/Environment Level of Independence: Independent with assistive device(s)        Comments: Utilized a SPC to mobilize around the community.        OT Problem List: Impaired balance (sitting and/or standing);Decreased activity tolerance;Decreased range of motion;Decreased strength;Pain      OT Treatment/Interventions:      OT Goals(Current goals can be found in the care plan section) Acute Rehab OT Goals Patient Stated Goal: to get back to gardening OT Goal Formulation: With patient Time For Goal Achievement: 01/26/21 Potential to Achieve Goals: Good  OT Frequency:     Barriers to D/C:            Co-evaluation              AM-PAC OT "6 Clicks" Daily Activity     Outcome Measure Help from another person eating meals?: None Help from another person taking care of personal grooming?: None Help from another person toileting, which includes using toliet, bedpan, or urinal?: A Little Help from another person bathing (including washing, rinsing, drying)?: A Little Help from another person to put on and taking off regular upper body clothing?: None Help from another person to put on and taking off regular lower body clothing?: A Little 6 Click Score: 21   End of Session Equipment Utilized During Treatment: Rolling walker  Activity Tolerance: Patient tolerated treatment well Patient left: in chair;with call bell/phone within reach;with chair alarm set;with family/visitor present  OT Visit Diagnosis: Muscle weakness (generalized) (M62.81);Unsteadiness on feet (R26.81);Pain Pain - part of body: Knee                Time: 7619-5093 OT Time Calculation (min): 21 min Charges:  OT General Charges $OT Visit: 1 Visit OT Evaluation $OT Eval Low Complexity: 1 Low OT Treatments $Self Care/Home Management : 8-22 mins  Josiah Lobo, PhD, MS, OTR/L 01/12/21, 1:18 PM

## 2021-01-12 NOTE — Progress Notes (Signed)
Physical Therapy Treatment Patient Details Name: Kiara Perry MRN: [id number] DOB: 1935/02/16 Today's Date: 01/12/2021    History of Present Illness Kiara Perry is a 86 y.o. female s/p L TKA on 01/11/21. Patient has never been a smoker. Pertinent PMH includes: CAD, angina, cardiac murmur, aortic atherosclerosis, HTN, HLD, DVT/PE, GERD (on daily PPI), OA.    PT Comments    Pt supine in bed and agreeable to therapy.  Pt continues to perform well with exercises given to her in previous session.  Pt is able to perform transfers with supervision for management of lines.  Pt then transfers to standing with ambulation around the nursing station and back to room.  Pt with good carryover for ambulation and has made significant progress with PT.  Pt stated she did not want to ambulate around the nursing loop another time because she wanted to save her strength for when she is d/c today.  Current discharge plans to home with HHPT remain appropriate at this time.  Pt will continue to benefit from skilled therapy in order to address deficits listed below.    Follow Up Recommendations  Home health PT;Supervision for mobility/OOB     Equipment Recommendations  Rolling walker with 5" wheels    Recommendations for Other Services       Precautions / Restrictions Precautions Precautions: Knee Restrictions Weight Bearing Restrictions: Yes LLE Weight Bearing: Weight bearing as tolerated    Mobility  Bed Mobility Overal bed mobility: Modified Independent                  Transfers Overall transfer level: Needs assistance Equipment used: Rolling walker (2 wheeled) Transfers: Sit to/from Stand Sit to Stand: Supervision         General transfer comment: Supervision and managment of lines/leads utilized during session  Ambulation/Gait Ambulation/Gait assistance: Min guard Gait Distance (Feet): 180 Feet Assistive device: Rolling walker (2 wheeled) Gait Pattern/deviations:  Step-to pattern;Decreased step length - right;Decreased stance time - left;Decreased stride length Gait velocity: decreased   General Gait Details: Pt ambulates well, places good amount of weight through UE's for offloading of L LE.   Stairs             Wheelchair Mobility    Modified Rankin (Stroke Patients Only)       Balance Overall balance assessment: Modified Independent                                          Cognition Arousal/Alertness: Awake/alert Behavior During Therapy: WFL for tasks assessed/performed Overall Cognitive Status: Within Functional Limits for tasks assessed                                        Exercises Total Joint Exercises Ankle Circles/Pumps: AROM;Strengthening;Both;10 reps;Supine Quad Sets: AROM;Strengthening;Both;10 reps;Supine Gluteal Sets: AROM;Strengthening;Both;10 reps;Supine Heel Slides: AROM;Strengthening;Both;10 reps;Supine Hip ABduction/ADduction: AROM;Strengthening;Both;10 reps;Supine Straight Leg Raises: AROM;Strengthening;Both;10 reps;Supine Marching in Standing: AROM;Strengthening;Both;10 reps;Standing    General Comments        Pertinent Vitals/Pain Pain Assessment: 0-10 Pain Score: 6  Pain Location: L Posterior Knee Pain Descriptors / Indicators: Aching Pain Intervention(s): Limited activity within patient's tolerance;Monitored during session;Repositioned;Ice applied    Home Living  Prior Function            PT Goals (current goals can now be found in the care plan section) Acute Rehab PT Goals Patient Stated Goal: to go home and get stronger. PT Goal Formulation: With patient/family Time For Goal Achievement: 01/25/21 Potential to Achieve Goals: Good Progress towards PT goals: Progressing toward goals    Frequency    BID      PT Plan Current plan remains appropriate    Co-evaluation              AM-PAC PT "6 Clicks"  Mobility   Outcome Measure  Help needed turning from your back to your side while in a flat bed without using bedrails?: A Little Help needed moving from lying on your back to sitting on the side of a flat bed without using bedrails?: A Little Help needed moving to and from a bed to a chair (including a wheelchair)?: A Little Help needed standing up from a chair using your arms (e.g., wheelchair or bedside chair)?: A Little Help needed to walk in hospital room?: A Little Help needed climbing 3-5 steps with a railing? : A Little 6 Click Score: 18    End of Session Equipment Utilized During Treatment: Gait belt Activity Tolerance: Patient tolerated treatment well Patient left: in chair;with call bell/phone within reach;with chair alarm set;with family/visitor present Nurse Communication: Mobility status PT Visit Diagnosis: Unsteadiness on feet (R26.81);Other abnormalities of gait and mobility (R26.89);Muscle weakness (generalized) (M62.81);Difficulty in walking, not elsewhere classified (R26.2);Pain Pain - Right/Left: Left Pain - part of body: Knee     Time: 2081-3887 PT Time Calculation (min) (ACUTE ONLY): 23 min  Charges:  $Gait Training: 8-22 mins $Therapeutic Exercise: 8-22 mins                     Gwenlyn Saran, PT, DPT 01/12/21, 5:26 PM

## 2021-01-12 NOTE — Plan of Care (Signed)
Post-op dressing removed. Fresh honeycomb placed. Hemovac removed, mini compression dressing applied.

## 2021-01-12 NOTE — Plan of Care (Signed)
Problem: Education: Goal: Knowledge of General Education information will improve Description: Including pain rating scale, medication(s)/side effects and non-pharmacologic comfort measures 01/12/2021 1748 by Trula Slade, RN Outcome: Adequate for Discharge 01/12/2021 1105 by Trula Slade, RN Outcome: Progressing   Problem: Health Behavior/Discharge Planning: Goal: Ability to manage health-related needs will improve 01/12/2021 1748 by Trula Slade, RN Outcome: Adequate for Discharge 01/12/2021 1105 by Trula Slade, RN Outcome: Progressing   Problem: Clinical Measurements: Goal: Ability to maintain clinical measurements within normal limits will improve 01/12/2021 1748 by Trula Slade, RN Outcome: Adequate for Discharge 01/12/2021 1105 by Trula Slade, RN Outcome: Progressing Goal: Will remain free from infection 01/12/2021 1748 by Trula Slade, RN Outcome: Adequate for Discharge 01/12/2021 1105 by Trula Slade, RN Outcome: Progressing Goal: Diagnostic test results will improve 01/12/2021 1748 by Trula Slade, RN Outcome: Adequate for Discharge 01/12/2021 1105 by Trula Slade, RN Outcome: Progressing Goal: Respiratory complications will improve 01/12/2021 1748 by Trula Slade, RN Outcome: Adequate for Discharge 01/12/2021 1105 by Trula Slade, RN Outcome: Progressing Goal: Cardiovascular complication will be avoided 01/12/2021 1748 by Trula Slade, RN Outcome: Adequate for Discharge 01/12/2021 1105 by Trula Slade, RN Outcome: Progressing   Problem: Activity: Goal: Risk for activity intolerance will decrease 01/12/2021 1748 by Trula Slade, RN Outcome: Adequate for Discharge 01/12/2021 1105 by Trula Slade, RN Outcome: Progressing   Problem: Nutrition: Goal: Adequate nutrition will be maintained 01/12/2021 1748 by Trula Slade, RN Outcome: Adequate for Discharge 01/12/2021 1105 by Trula Slade, RN Outcome: Progressing   Problem: Coping: Goal: Level of anxiety will decrease 01/12/2021 1748 by Trula Slade, RN Outcome: Adequate for Discharge 01/12/2021 1105 by Trula Slade, RN Outcome: Progressing   Problem: Elimination: Goal: Will not experience complications related to bowel motility 01/12/2021 1748 by Trula Slade, RN Outcome: Adequate for Discharge 01/12/2021 1105 by Trula Slade, RN Outcome: Progressing Goal: Will not experience complications related to urinary retention 01/12/2021 1748 by Trula Slade, RN Outcome: Adequate for Discharge 01/12/2021 1105 by Trula Slade, RN Outcome: Progressing   Problem: Safety: Goal: Ability to remain free from injury will improve 01/12/2021 1748 by Trula Slade, RN Outcome: Adequate for Discharge 01/12/2021 1105 by Trula Slade, RN Outcome: Progressing   Problem: Skin Integrity: Goal: Risk for impaired skin integrity will decrease 01/12/2021 1748 by Trula Slade, RN Outcome: Adequate for Discharge 01/12/2021 1105 by Trula Slade, RN Outcome: Progressing   Problem: Education: Goal: Knowledge of the prescribed therapeutic regimen will improve 01/12/2021 1748 by Trula Slade, RN Outcome: Adequate for Discharge 01/12/2021 1105 by Trula Slade, RN Outcome: Progressing Goal: Individualized Educational Video(s) 01/12/2021 1748 by Trula Slade, RN Outcome: Adequate for Discharge 01/12/2021 1105 by Trula Slade, RN Outcome: Progressing   Problem: Activity: Goal: Ability to avoid complications of mobility impairment will improve 01/12/2021 1748 by Trula Slade, RN Outcome: Adequate for Discharge 01/12/2021 1105 by Trula Slade, RN Outcome: Progressing Goal: Range of joint motion will improve 01/12/2021 1748 by Trula Slade, RN Outcome: Adequate for Discharge 01/12/2021 1105 by Trula Slade, RN Outcome: Progressing   Problem: Clinical  Measurements: Goal: Postoperative complications will be avoided or minimized 01/12/2021 1748 by Trula Slade, RN Outcome: Adequate for Discharge 01/12/2021 1105 by Trula Slade, RN Outcome: Progressing   Problem: Skin Integrity: Goal: Will show signs of wound healing 01/12/2021 1748  by Trula Slade, RN Outcome: Adequate for Discharge 01/12/2021 1105 by Trula Slade, RN Outcome: Progressing

## 2021-01-12 NOTE — Plan of Care (Signed)
  Problem: Health Behavior/Discharge Planning: Goal: Ability to manage health-related needs will improve Outcome: Progressing   

## 2021-01-12 NOTE — Plan of Care (Signed)

## 2021-01-14 DIAGNOSIS — Z96653 Presence of artificial knee joint, bilateral: Secondary | ICD-10-CM | POA: Diagnosis not present

## 2021-01-14 DIAGNOSIS — Z471 Aftercare following joint replacement surgery: Secondary | ICD-10-CM | POA: Diagnosis not present

## 2021-01-14 DIAGNOSIS — I700 Atherosclerosis of aorta: Secondary | ICD-10-CM | POA: Diagnosis not present

## 2021-01-14 DIAGNOSIS — M48061 Spinal stenosis, lumbar region without neurogenic claudication: Secondary | ICD-10-CM | POA: Diagnosis not present

## 2021-01-14 DIAGNOSIS — I2510 Atherosclerotic heart disease of native coronary artery without angina pectoris: Secondary | ICD-10-CM | POA: Diagnosis not present

## 2021-01-14 DIAGNOSIS — I872 Venous insufficiency (chronic) (peripheral): Secondary | ICD-10-CM | POA: Diagnosis not present

## 2021-01-14 DIAGNOSIS — H9193 Unspecified hearing loss, bilateral: Secondary | ICD-10-CM | POA: Diagnosis not present

## 2021-01-14 DIAGNOSIS — M5442 Lumbago with sciatica, left side: Secondary | ICD-10-CM | POA: Diagnosis not present

## 2021-01-14 DIAGNOSIS — M810 Age-related osteoporosis without current pathological fracture: Secondary | ICD-10-CM | POA: Diagnosis not present

## 2021-01-14 DIAGNOSIS — F5104 Psychophysiologic insomnia: Secondary | ICD-10-CM | POA: Diagnosis not present

## 2021-01-14 DIAGNOSIS — I890 Lymphedema, not elsewhere classified: Secondary | ICD-10-CM | POA: Diagnosis not present

## 2021-01-14 DIAGNOSIS — I10 Essential (primary) hypertension: Secondary | ICD-10-CM | POA: Diagnosis not present

## 2021-01-14 DIAGNOSIS — Z86711 Personal history of pulmonary embolism: Secondary | ICD-10-CM | POA: Diagnosis not present

## 2021-01-14 DIAGNOSIS — E785 Hyperlipidemia, unspecified: Secondary | ICD-10-CM | POA: Diagnosis not present

## 2021-01-14 DIAGNOSIS — Z8616 Personal history of COVID-19: Secondary | ICD-10-CM | POA: Diagnosis not present

## 2021-01-14 DIAGNOSIS — R32 Unspecified urinary incontinence: Secondary | ICD-10-CM | POA: Diagnosis not present

## 2021-01-14 DIAGNOSIS — Z86010 Personal history of colonic polyps: Secondary | ICD-10-CM | POA: Diagnosis not present

## 2021-01-14 DIAGNOSIS — N6019 Diffuse cystic mastopathy of unspecified breast: Secondary | ICD-10-CM | POA: Diagnosis not present

## 2021-01-14 DIAGNOSIS — K219 Gastro-esophageal reflux disease without esophagitis: Secondary | ICD-10-CM | POA: Diagnosis not present

## 2021-01-14 DIAGNOSIS — Z79891 Long term (current) use of opiate analgesic: Secondary | ICD-10-CM | POA: Diagnosis not present

## 2021-01-16 DIAGNOSIS — Z86711 Personal history of pulmonary embolism: Secondary | ICD-10-CM | POA: Diagnosis not present

## 2021-01-16 DIAGNOSIS — M5442 Lumbago with sciatica, left side: Secondary | ICD-10-CM | POA: Diagnosis not present

## 2021-01-16 DIAGNOSIS — R32 Unspecified urinary incontinence: Secondary | ICD-10-CM | POA: Diagnosis not present

## 2021-01-16 DIAGNOSIS — I872 Venous insufficiency (chronic) (peripheral): Secondary | ICD-10-CM | POA: Diagnosis not present

## 2021-01-16 DIAGNOSIS — I700 Atherosclerosis of aorta: Secondary | ICD-10-CM | POA: Diagnosis not present

## 2021-01-16 DIAGNOSIS — M48061 Spinal stenosis, lumbar region without neurogenic claudication: Secondary | ICD-10-CM | POA: Diagnosis not present

## 2021-01-16 DIAGNOSIS — H9193 Unspecified hearing loss, bilateral: Secondary | ICD-10-CM | POA: Diagnosis not present

## 2021-01-16 DIAGNOSIS — Z86010 Personal history of colonic polyps: Secondary | ICD-10-CM | POA: Diagnosis not present

## 2021-01-16 DIAGNOSIS — K219 Gastro-esophageal reflux disease without esophagitis: Secondary | ICD-10-CM | POA: Diagnosis not present

## 2021-01-16 DIAGNOSIS — F5104 Psychophysiologic insomnia: Secondary | ICD-10-CM | POA: Diagnosis not present

## 2021-01-16 DIAGNOSIS — Z96653 Presence of artificial knee joint, bilateral: Secondary | ICD-10-CM | POA: Diagnosis not present

## 2021-01-16 DIAGNOSIS — M810 Age-related osteoporosis without current pathological fracture: Secondary | ICD-10-CM | POA: Diagnosis not present

## 2021-01-16 DIAGNOSIS — E785 Hyperlipidemia, unspecified: Secondary | ICD-10-CM | POA: Diagnosis not present

## 2021-01-16 DIAGNOSIS — N6019 Diffuse cystic mastopathy of unspecified breast: Secondary | ICD-10-CM | POA: Diagnosis not present

## 2021-01-16 DIAGNOSIS — I10 Essential (primary) hypertension: Secondary | ICD-10-CM | POA: Diagnosis not present

## 2021-01-16 DIAGNOSIS — Z8616 Personal history of COVID-19: Secondary | ICD-10-CM | POA: Diagnosis not present

## 2021-01-16 DIAGNOSIS — Z471 Aftercare following joint replacement surgery: Secondary | ICD-10-CM | POA: Diagnosis not present

## 2021-01-16 DIAGNOSIS — Z79891 Long term (current) use of opiate analgesic: Secondary | ICD-10-CM | POA: Diagnosis not present

## 2021-01-16 DIAGNOSIS — I2510 Atherosclerotic heart disease of native coronary artery without angina pectoris: Secondary | ICD-10-CM | POA: Diagnosis not present

## 2021-01-16 DIAGNOSIS — I890 Lymphedema, not elsewhere classified: Secondary | ICD-10-CM | POA: Diagnosis not present

## 2021-01-22 DIAGNOSIS — Z471 Aftercare following joint replacement surgery: Secondary | ICD-10-CM | POA: Diagnosis not present

## 2021-01-25 DIAGNOSIS — I872 Venous insufficiency (chronic) (peripheral): Secondary | ICD-10-CM | POA: Diagnosis not present

## 2021-01-25 DIAGNOSIS — Z79891 Long term (current) use of opiate analgesic: Secondary | ICD-10-CM | POA: Diagnosis not present

## 2021-01-25 DIAGNOSIS — I10 Essential (primary) hypertension: Secondary | ICD-10-CM | POA: Diagnosis not present

## 2021-01-25 DIAGNOSIS — Z96653 Presence of artificial knee joint, bilateral: Secondary | ICD-10-CM | POA: Diagnosis not present

## 2021-01-25 DIAGNOSIS — E785 Hyperlipidemia, unspecified: Secondary | ICD-10-CM | POA: Diagnosis not present

## 2021-01-25 DIAGNOSIS — R32 Unspecified urinary incontinence: Secondary | ICD-10-CM | POA: Diagnosis not present

## 2021-01-25 DIAGNOSIS — I890 Lymphedema, not elsewhere classified: Secondary | ICD-10-CM | POA: Diagnosis not present

## 2021-01-25 DIAGNOSIS — M810 Age-related osteoporosis without current pathological fracture: Secondary | ICD-10-CM | POA: Diagnosis not present

## 2021-01-25 DIAGNOSIS — Z471 Aftercare following joint replacement surgery: Secondary | ICD-10-CM | POA: Diagnosis not present

## 2021-01-25 DIAGNOSIS — Z86711 Personal history of pulmonary embolism: Secondary | ICD-10-CM | POA: Diagnosis not present

## 2021-01-25 DIAGNOSIS — H9193 Unspecified hearing loss, bilateral: Secondary | ICD-10-CM | POA: Diagnosis not present

## 2021-01-25 DIAGNOSIS — M48061 Spinal stenosis, lumbar region without neurogenic claudication: Secondary | ICD-10-CM | POA: Diagnosis not present

## 2021-01-25 DIAGNOSIS — Z86010 Personal history of colonic polyps: Secondary | ICD-10-CM | POA: Diagnosis not present

## 2021-01-25 DIAGNOSIS — N6019 Diffuse cystic mastopathy of unspecified breast: Secondary | ICD-10-CM | POA: Diagnosis not present

## 2021-01-25 DIAGNOSIS — K219 Gastro-esophageal reflux disease without esophagitis: Secondary | ICD-10-CM | POA: Diagnosis not present

## 2021-01-25 DIAGNOSIS — F5104 Psychophysiologic insomnia: Secondary | ICD-10-CM | POA: Diagnosis not present

## 2021-01-25 DIAGNOSIS — M5442 Lumbago with sciatica, left side: Secondary | ICD-10-CM | POA: Diagnosis not present

## 2021-01-25 DIAGNOSIS — I700 Atherosclerosis of aorta: Secondary | ICD-10-CM | POA: Diagnosis not present

## 2021-01-25 DIAGNOSIS — Z8616 Personal history of COVID-19: Secondary | ICD-10-CM | POA: Diagnosis not present

## 2021-01-25 DIAGNOSIS — I2510 Atherosclerotic heart disease of native coronary artery without angina pectoris: Secondary | ICD-10-CM | POA: Diagnosis not present

## 2021-01-26 DIAGNOSIS — M6281 Muscle weakness (generalized): Secondary | ICD-10-CM | POA: Diagnosis not present

## 2021-01-26 DIAGNOSIS — Z96652 Presence of left artificial knee joint: Secondary | ICD-10-CM | POA: Diagnosis not present

## 2021-01-26 DIAGNOSIS — M25562 Pain in left knee: Secondary | ICD-10-CM | POA: Diagnosis not present

## 2021-01-26 DIAGNOSIS — M25662 Stiffness of left knee, not elsewhere classified: Secondary | ICD-10-CM | POA: Diagnosis not present

## 2021-01-27 DIAGNOSIS — Z96652 Presence of left artificial knee joint: Secondary | ICD-10-CM | POA: Diagnosis not present

## 2021-01-27 DIAGNOSIS — M25562 Pain in left knee: Secondary | ICD-10-CM | POA: Diagnosis not present

## 2021-01-29 DIAGNOSIS — Z96652 Presence of left artificial knee joint: Secondary | ICD-10-CM | POA: Diagnosis not present

## 2021-01-29 DIAGNOSIS — M25562 Pain in left knee: Secondary | ICD-10-CM | POA: Diagnosis not present

## 2021-02-02 DIAGNOSIS — M25562 Pain in left knee: Secondary | ICD-10-CM | POA: Diagnosis not present

## 2021-02-02 DIAGNOSIS — Z96652 Presence of left artificial knee joint: Secondary | ICD-10-CM | POA: Diagnosis not present

## 2021-02-04 DIAGNOSIS — M25562 Pain in left knee: Secondary | ICD-10-CM | POA: Diagnosis not present

## 2021-02-04 DIAGNOSIS — Z96652 Presence of left artificial knee joint: Secondary | ICD-10-CM | POA: Diagnosis not present

## 2021-02-08 DIAGNOSIS — Z96652 Presence of left artificial knee joint: Secondary | ICD-10-CM | POA: Diagnosis not present

## 2021-02-08 DIAGNOSIS — M25562 Pain in left knee: Secondary | ICD-10-CM | POA: Diagnosis not present

## 2021-02-10 DIAGNOSIS — M25562 Pain in left knee: Secondary | ICD-10-CM | POA: Diagnosis not present

## 2021-02-10 DIAGNOSIS — Z96652 Presence of left artificial knee joint: Secondary | ICD-10-CM | POA: Diagnosis not present

## 2021-02-12 DIAGNOSIS — M25562 Pain in left knee: Secondary | ICD-10-CM | POA: Diagnosis not present

## 2021-02-12 DIAGNOSIS — Z96652 Presence of left artificial knee joint: Secondary | ICD-10-CM | POA: Diagnosis not present

## 2021-02-15 DIAGNOSIS — Z96652 Presence of left artificial knee joint: Secondary | ICD-10-CM | POA: Diagnosis not present

## 2021-02-15 DIAGNOSIS — M25562 Pain in left knee: Secondary | ICD-10-CM | POA: Diagnosis not present

## 2021-02-16 DIAGNOSIS — M48061 Spinal stenosis, lumbar region without neurogenic claudication: Secondary | ICD-10-CM | POA: Diagnosis not present

## 2021-02-16 DIAGNOSIS — I10 Essential (primary) hypertension: Secondary | ICD-10-CM | POA: Diagnosis not present

## 2021-02-16 DIAGNOSIS — Z23 Encounter for immunization: Secondary | ICD-10-CM | POA: Diagnosis not present

## 2021-02-16 DIAGNOSIS — G8929 Other chronic pain: Secondary | ICD-10-CM | POA: Diagnosis not present

## 2021-02-16 DIAGNOSIS — I2510 Atherosclerotic heart disease of native coronary artery without angina pectoris: Secondary | ICD-10-CM | POA: Diagnosis not present

## 2021-02-16 DIAGNOSIS — Z0000 Encounter for general adult medical examination without abnormal findings: Secondary | ICD-10-CM | POA: Diagnosis not present

## 2021-02-16 DIAGNOSIS — Z79899 Other long term (current) drug therapy: Secondary | ICD-10-CM | POA: Diagnosis not present

## 2021-02-16 DIAGNOSIS — E782 Mixed hyperlipidemia: Secondary | ICD-10-CM | POA: Diagnosis not present

## 2021-02-16 DIAGNOSIS — M5442 Lumbago with sciatica, left side: Secondary | ICD-10-CM | POA: Diagnosis not present

## 2021-02-16 DIAGNOSIS — G894 Chronic pain syndrome: Secondary | ICD-10-CM | POA: Diagnosis not present

## 2021-02-17 DIAGNOSIS — Z96652 Presence of left artificial knee joint: Secondary | ICD-10-CM | POA: Diagnosis not present

## 2021-02-17 DIAGNOSIS — M25562 Pain in left knee: Secondary | ICD-10-CM | POA: Diagnosis not present

## 2021-02-19 DIAGNOSIS — Z96652 Presence of left artificial knee joint: Secondary | ICD-10-CM | POA: Diagnosis not present

## 2021-02-19 DIAGNOSIS — M25562 Pain in left knee: Secondary | ICD-10-CM | POA: Diagnosis not present

## 2021-02-22 DIAGNOSIS — Z96652 Presence of left artificial knee joint: Secondary | ICD-10-CM | POA: Diagnosis not present

## 2021-02-22 DIAGNOSIS — M25562 Pain in left knee: Secondary | ICD-10-CM | POA: Diagnosis not present

## 2021-02-23 DIAGNOSIS — Z96652 Presence of left artificial knee joint: Secondary | ICD-10-CM | POA: Diagnosis not present

## 2021-02-24 DIAGNOSIS — M25562 Pain in left knee: Secondary | ICD-10-CM | POA: Diagnosis not present

## 2021-02-24 DIAGNOSIS — Z96652 Presence of left artificial knee joint: Secondary | ICD-10-CM | POA: Diagnosis not present

## 2021-02-26 DIAGNOSIS — Z96652 Presence of left artificial knee joint: Secondary | ICD-10-CM | POA: Diagnosis not present

## 2021-02-26 DIAGNOSIS — M25562 Pain in left knee: Secondary | ICD-10-CM | POA: Diagnosis not present

## 2021-03-01 ENCOUNTER — Encounter (INDEPENDENT_AMBULATORY_CARE_PROVIDER_SITE_OTHER): Payer: Self-pay | Admitting: Vascular Surgery

## 2021-03-01 ENCOUNTER — Ambulatory Visit (INDEPENDENT_AMBULATORY_CARE_PROVIDER_SITE_OTHER): Payer: PPO | Admitting: Vascular Surgery

## 2021-03-01 ENCOUNTER — Other Ambulatory Visit: Payer: Self-pay

## 2021-03-01 VITALS — BP 147/67 | HR 60 | Resp 16 | Wt 173.8 lb

## 2021-03-01 DIAGNOSIS — I2510 Atherosclerotic heart disease of native coronary artery without angina pectoris: Secondary | ICD-10-CM | POA: Diagnosis not present

## 2021-03-01 DIAGNOSIS — Z86711 Personal history of pulmonary embolism: Secondary | ICD-10-CM | POA: Diagnosis not present

## 2021-03-01 DIAGNOSIS — M159 Polyosteoarthritis, unspecified: Secondary | ICD-10-CM

## 2021-03-01 DIAGNOSIS — M8949 Other hypertrophic osteoarthropathy, multiple sites: Secondary | ICD-10-CM | POA: Diagnosis not present

## 2021-03-01 DIAGNOSIS — I2584 Coronary atherosclerosis due to calcified coronary lesion: Secondary | ICD-10-CM | POA: Diagnosis not present

## 2021-03-01 DIAGNOSIS — I10 Essential (primary) hypertension: Secondary | ICD-10-CM | POA: Diagnosis not present

## 2021-03-01 DIAGNOSIS — I872 Venous insufficiency (chronic) (peripheral): Secondary | ICD-10-CM | POA: Diagnosis not present

## 2021-03-01 NOTE — H&P (View-Only) (Signed)
MRN : [id number]  Kiara Perry is a 86 y.o. (1935/08/20) female who presents with chief complaint of No chief complaint on file. 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  DVT/PE was identified at [facility] by Duplex ultrasound/ CT scan.    She is s/p Left total knee arthroplasty using computer-assisted navigation on 01/11/2021.  The initial symptoms were pain and swelling in the lower extremity associated with shortness of breath.  The patient notes the leg continues to be very painful with dependency and swells quite a bite.  Symptoms are much better with elevation.  The patient notes minimal edema in the morning which steadily worsens throughout the day.    The patient has not been using compression therapy at this point.  No SOB or pleuritic chest pains.  No cough or hemoptysis.  No blood per rectum or blood in any sputum.  No excessive bruising per the patient.       No outpatient medications have been marked as taking for the 03/01/21 encounter (Appointment) with Delana Meyer, Dolores Lory, MD.    Past Medical History:  Diagnosis Date  . Anginal pain ([place])   . Aortic atherosclerosis ([place])   . Arthritis   . Coronary artery disease   . COVID-19 09/2019  . GERD (gastroesophageal reflux disease)   . Heart murmur   . History of pulmonary embolus (PE)   . Hyperlipidemia   . Hypertension   . Pneumonia     Past Surgical History:  Procedure Laterality Date  . ABDOMINAL HYSTERECTOMY    . APPENDECTOMY    . BACK SURGERY    . BREAST BIOPSY Left 1973   neg  . BREAST CYST ASPIRATION Left 1970   neg  . COLONOSCOPY    . ESOPHAGOGASTRODUODENOSCOPY    . EYE SURGERY     cateract  . IVC FILTER INSERTION N/A 10/13/2020   Procedure: IVC FILTER INSERTION;  Surgeon: Katha Cabal, MD;  Location: [facility];  Service: Cardiovascular;  Laterality: N/A;  . KNEE ARTHROPLASTY Left  01/11/2021   Procedure: COMPUTER ASSISTED TOTAL KNEE ARTHROPLASTY - RNFA;  Surgeon: Dereck Leep, MD;  Location: [facility];  Service: Orthopedics;  Laterality: Left;  . MENISCUS REPAIR     right knee  . TONSILLECTOMY      Social History Social History   Tobacco Use  . Smoking status: Never Smoker  . Smokeless tobacco: Never Used  Vaping Use  . Vaping Use: Never used  Substance Use Topics  . Alcohol use: Never  . Drug use: Never    Family History Family History  Problem Relation Age of Onset  . Breast cancer Sister 8    Allergies  Allergen Reactions  . Levofloxacin Nausea Only     REVIEW OF SYSTEMS (Negative unless checked)  Constitutional: [] Weight loss  [] Fever  [] Chills Cardiac: [] Chest pain   [] Chest pressure   [] Palpitations   [] Shortness of breath when laying flat   [] Shortness of breath with exertion. Vascular:  [] Pain in legs with walking   [] Pain in legs at rest  [] History of DVT   [] Phlebitis   [] Swelling in legs   [] Varicose veins   [] Non-healing ulcers Pulmonary:   [] Uses home oxygen   [] Productive cough   [] Hemoptysis   [] Wheeze  [] COPD   [] Asthma Neurologic:  [] Dizziness   [] Seizures   [] History of stroke   []   History of TIA  [] Aphasia   [] Vissual changes   [] Weakness or numbness in arm   [] Weakness or numbness in leg Musculoskeletal:   [] Joint swelling   [x] Joint pain   [x] Low back pain Hematologic:  [] Easy bruising  [] Easy bleeding   [x] Hypercoagulable state   [] Anemic Gastrointestinal:  [] Diarrhea   [] Vomiting  [] Gastroesophageal reflux/heartburn   [] Difficulty swallowing. Genitourinary:  [] Chronic kidney disease   [] Difficult urination  [] Frequent urination   [] Blood in urine Skin:  [] Rashes   [] Ulcers  Psychological:  [] History of anxiety   []  History of major depression.  Physical Examination  There were no vitals filed for this visit. There is no height or weight on file to calculate BMI. Gen: WD/WN, NAD Head: [place]/AT, No temporalis wasting.   Ear/Nose/Throat: Hearing grossly intact, nares w/o erythema or drainage Eyes: PER, EOMI, sclera nonicteric.  Neck: Supple, no large masses.   Pulmonary:  Good air movement, no audible wheezing bilaterally, no use of accessory muscles.  Cardiac: RRR, no JVD Vascular: scattered varicosities present bilaterally.  Mild venous stasis changes to the legs bilaterally.  2+ soft pitting edema Vessel Right Left  Radial Palpable Palpable  Gastrointestinal: Non-distended. No guarding/no peritoneal signs.  Musculoskeletal: M/S 5/5 throughout.  No deformity or atrophy.  Neurologic: CN 2-12 intact. Symmetrical.  Speech is fluent. Motor exam as listed above. Psychiatric: Judgment intact, Mood & affect appropriate for pt's clinical situation. Dermatologic: Venous rashes no ulcers noted.  No changes consistent with cellulitis. Lymph : + lichenification / skin changes of chronic lymphedema.  CBC Lab Results  Component Value Date   WBC 6.3 01/05/2021   HGB 12.6 01/05/2021   HCT 38.4 01/05/2021   MCV 91.4 01/05/2021   PLT 262 01/05/2021    BMET    Component Value Date/Time   NA 135 01/05/2021 1144   K 4.1 01/05/2021 1144   CL 99 01/05/2021 1144   CO2 28 01/05/2021 1144   GLUCOSE 97 01/05/2021 1144   BUN 19 01/05/2021 1144   CREATININE 1.02 (H) 01/05/2021 1144   CALCIUM 9.5 01/05/2021 1144   GFRNONAA 55 (L) 01/05/2021 1144   CrCl cannot be calculated (Patient's most recent lab result is older than the maximum 21 days allowed.).  COAG Lab Results  Component Value Date   INR 0.9 01/05/2021    Radiology No results found.    Assessment/Plan 1. History of pulmonary embolus (PE) The patient no longer requires anticoagulation.  IVC filter should now be removed.  Risk and benefits were reviewed the patient.  Indications for the procedure were reviewed.  All questions were answered, the patient agrees to proceed.   I have reviewed my discussion with the patient regarding DVT and post  phlebitic changes such as swelling and why it  causes symptoms such as pain.  The patient will wear graduated compression stockings class 1 (20-30 mmHg) on a daily basis a prescription was given. The patient will  beginning wearing the stockings first thing in the morning and removing them in the evening. The patient is instructed specifically not to sleep in the stockings.  In addition, behavioral modification including elevation during the day and avoidance of prolonged dependency will be initiated.    Patient will follow up with me after filter retrieval  2. Primary osteoarthritis involving multiple joints She is status post left total knee replacement and has done well.  She continues with her physical therapy and is ambulating with a cane.  We will move forward with filter retrieval.  3. Venous insufficiency No surgery or intervention at this point in time.    I have reviewed my discussion with the patient regarding venous insufficiency and why it  causes symptoms. I have discussed with the patient the chronic skin changes that accompany venous insufficiency and the long term sequela such as infection and ulceration.  Patient will begin wearing graduated compression stockings class 1 (20-30 mmHg) or compression wraps on a daily basis a prescription was given. The patient will put the stockings on first thing in the morning and removing them in the evening. The patient is instructed specifically not to sleep in the stockings.    In addition, behavioral modification including several periods of elevation of the lower extremities during the day will be continued. I have demonstrated that proper elevation is a position with the ankles at heart level.  The patient is instructed to begin routine exercise, especially walking on a daily basis   4. Coronary artery calcification Continue cardiac and antihypertensive medications as already ordered and reviewed, no changes at this time.  Continue statin  as ordered and reviewed, no changes at this time  Nitrates PRN for chest pain   5. Primary hypertension Continue antihypertensive medications as already ordered, these medications have been reviewed and there are no changes at this time.   Hortencia Pilar, MD  03/01/2021 10:23 AM

## 2021-03-01 NOTE — Progress Notes (Signed)
MRN : [id number]  Kiara Perry is a 86 y.o. (1935/08/20) female who presents with chief complaint of No chief complaint on file. 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  DVT/PE was identified at [facility] by Duplex ultrasound/ CT scan.    She is s/p Left total knee arthroplasty using computer-assisted navigation on 01/11/2021.  The initial symptoms were pain and swelling in the lower extremity associated with shortness of breath.  The patient notes the leg continues to be very painful with dependency and swells quite a bite.  Symptoms are much better with elevation.  The patient notes minimal edema in the morning which steadily worsens throughout the day.    The patient has not been using compression therapy at this point.  No SOB or pleuritic chest pains.  No cough or hemoptysis.  No blood per rectum or blood in any sputum.  No excessive bruising per the patient.       No outpatient medications have been marked as taking for the 03/01/21 encounter (Appointment) with Delana Meyer, Dolores Lory, MD.    Past Medical History:  Diagnosis Date  . Anginal pain ([place])   . Aortic atherosclerosis ([place])   . Arthritis   . Coronary artery disease   . COVID-19 09/2019  . GERD (gastroesophageal reflux disease)   . Heart murmur   . History of pulmonary embolus (PE)   . Hyperlipidemia   . Hypertension   . Pneumonia     Past Surgical History:  Procedure Laterality Date  . ABDOMINAL HYSTERECTOMY    . APPENDECTOMY    . BACK SURGERY    . BREAST BIOPSY Left 1973   neg  . BREAST CYST ASPIRATION Left 1970   neg  . COLONOSCOPY    . ESOPHAGOGASTRODUODENOSCOPY    . EYE SURGERY     cateract  . IVC FILTER INSERTION N/A 10/13/2020   Procedure: IVC FILTER INSERTION;  Surgeon: Katha Cabal, MD;  Location: [place] [facility];  Service: Cardiovascular;  Laterality: N/A;  . KNEE ARTHROPLASTY Left  01/11/2021   Procedure: COMPUTER ASSISTED TOTAL KNEE ARTHROPLASTY - RNFA;  Surgeon: Dereck Leep, MD;  Location: [facility];  Service: Orthopedics;  Laterality: Left;  . MENISCUS REPAIR     right knee  . TONSILLECTOMY      Social History Social History   Tobacco Use  . Smoking status: Never Smoker  . Smokeless tobacco: Never Used  Vaping Use  . Vaping Use: Never used  Substance Use Topics  . Alcohol use: Never  . Drug use: Never    Family History Family History  Problem Relation Age of Onset  . Breast cancer Sister 8    Allergies  Allergen Reactions  . Levofloxacin Nausea Only     REVIEW OF SYSTEMS (Negative unless checked)  Constitutional: [] Weight loss  [] Fever  [] Chills Cardiac: [] Chest pain   [] Chest pressure   [] Palpitations   [] Shortness of breath when laying flat   [] Shortness of breath with exertion. Vascular:  [] Pain in legs with walking   [] Pain in legs at rest  [] History of DVT   [] Phlebitis   [] Swelling in legs   [] Varicose veins   [] Non-healing ulcers Pulmonary:   [] Uses home oxygen   [] Productive cough   [] Hemoptysis   [] Wheeze  [] COPD   [] Asthma Neurologic:  [] Dizziness   [] Seizures   [] History of stroke   []   History of TIA  []Aphasia   []Vissual changes   []Weakness or numbness in arm   []Weakness or numbness in leg Musculoskeletal:   []Joint swelling   [x]Joint pain   [x]Low back pain Hematologic:  []Easy bruising  []Easy bleeding   [x]Hypercoagulable state   []Anemic Gastrointestinal:  []Diarrhea   []Vomiting  []Gastroesophageal reflux/heartburn   []Difficulty swallowing. Genitourinary:  []Chronic kidney disease   []Difficult urination  []Frequent urination   []Blood in urine Skin:  []Rashes   []Ulcers  Psychological:  []History of anxiety   [] History of major depression.  Physical Examination  There were no vitals filed for this visit. There is no height or weight on file to calculate BMI. Gen: WD/WN, NAD Head: [place]/AT, No temporalis wasting.   Ear/Nose/Throat: Hearing grossly intact, nares w/o erythema or drainage Eyes: PER, EOMI, sclera nonicteric.  Neck: Supple, no large masses.   Pulmonary:  Good air movement, no audible wheezing bilaterally, no use of accessory muscles.  Cardiac: RRR, no JVD Vascular: scattered varicosities present bilaterally.  Mild venous stasis changes to the legs bilaterally.  2+ soft pitting edema Vessel Right Left  Radial Palpable Palpable  Gastrointestinal: Non-distended. No guarding/no peritoneal signs.  Musculoskeletal: M/S 5/5 throughout.  No deformity or atrophy.  Neurologic: CN 2-12 intact. Symmetrical.  Speech is fluent. Motor exam as listed above. Psychiatric: Judgment intact, Mood & affect appropriate for pt's clinical situation. Dermatologic: Venous rashes no ulcers noted.  No changes consistent with cellulitis. Lymph : + lichenification / skin changes of chronic lymphedema.  CBC Lab Results  Component Value Date   WBC 6.3 01/05/2021   HGB 12.6 01/05/2021   HCT 38.4 01/05/2021   MCV 91.4 01/05/2021   PLT 262 01/05/2021    BMET    Component Value Date/Time   NA 135 01/05/2021 1144   K 4.1 01/05/2021 1144   CL 99 01/05/2021 1144   CO2 28 01/05/2021 1144   GLUCOSE 97 01/05/2021 1144   BUN 19 01/05/2021 1144   CREATININE 1.02 (H) 01/05/2021 1144   CALCIUM 9.5 01/05/2021 1144   GFRNONAA 55 (L) 01/05/2021 1144   CrCl cannot be calculated (Patient's most recent lab result is older than the maximum 21 days allowed.).  COAG Lab Results  Component Value Date   INR 0.9 01/05/2021    Radiology No results found.    Assessment/Plan 1. History of pulmonary embolus (PE) The patient no longer requires anticoagulation.  IVC filter should now be removed.  Risk and benefits were reviewed the patient.  Indications for the procedure were reviewed.  All questions were answered, the patient agrees to proceed.   I have reviewed my discussion with the patient regarding DVT and post  phlebitic changes such as swelling and why it  causes symptoms such as pain.  The patient will wear graduated compression stockings class 1 (20-30 mmHg) on a daily basis a prescription was given. The patient will  beginning wearing the stockings first thing in the morning and removing them in the evening. The patient is instructed specifically not to sleep in the stockings.  In addition, behavioral modification including elevation during the day and avoidance of prolonged dependency will be initiated.    Patient will follow up with me after filter retrieval  2. Primary osteoarthritis involving multiple joints She is status post left total knee replacement and has done well.  She continues with her physical therapy and is ambulating with a cane.  We will move forward with filter retrieval.    3. Venous insufficiency No surgery or intervention at this point in time.    I have reviewed my discussion with the patient regarding venous insufficiency and why it  causes symptoms. I have discussed with the patient the chronic skin changes that accompany venous insufficiency and the long term sequela such as infection and ulceration.  Patient will begin wearing graduated compression stockings class 1 (20-30 mmHg) or compression wraps on a daily basis a prescription was given. The patient will put the stockings on first thing in the morning and removing them in the evening. The patient is instructed specifically not to sleep in the stockings.    In addition, behavioral modification including several periods of elevation of the lower extremities during the day will be continued. I have demonstrated that proper elevation is a position with the ankles at heart level.  The patient is instructed to begin routine exercise, especially walking on a daily basis   4. Coronary artery calcification Continue cardiac and antihypertensive medications as already ordered and reviewed, no changes at this time.  Continue statin  as ordered and reviewed, no changes at this time  Nitrates PRN for chest pain   5. Primary hypertension Continue antihypertensive medications as already ordered, these medications have been reviewed and there are no changes at this time.   Moroni Nester, MD  03/01/2021 10:23 AM 

## 2021-03-02 DIAGNOSIS — E782 Mixed hyperlipidemia: Secondary | ICD-10-CM | POA: Diagnosis not present

## 2021-03-02 DIAGNOSIS — I10 Essential (primary) hypertension: Secondary | ICD-10-CM | POA: Diagnosis not present

## 2021-03-02 DIAGNOSIS — Z79899 Other long term (current) drug therapy: Secondary | ICD-10-CM | POA: Diagnosis not present

## 2021-03-03 ENCOUNTER — Encounter (INDEPENDENT_AMBULATORY_CARE_PROVIDER_SITE_OTHER): Payer: Self-pay | Admitting: Vascular Surgery

## 2021-03-09 ENCOUNTER — Telehealth (INDEPENDENT_AMBULATORY_CARE_PROVIDER_SITE_OTHER): Payer: Self-pay

## 2021-03-09 NOTE — Telephone Encounter (Signed)
Spoke with the patient and she is scheduled with Dr. Ronalee Belts for a IVC filter removal on 03/30/21 with a 12:00 pm arrival time to the MM. Pre-procedure instructions were discussed and will be mailed.

## 2021-03-29 ENCOUNTER — Other Ambulatory Visit (INDEPENDENT_AMBULATORY_CARE_PROVIDER_SITE_OTHER): Payer: Self-pay | Admitting: Nurse Practitioner

## 2021-03-30 ENCOUNTER — Ambulatory Visit
Admission: RE | Admit: 2021-03-30 | Discharge: 2021-03-30 | Disposition: A | Discharge status: Home / Self Care | Payer: PPO | Attending: Vascular Surgery | Admitting: Vascular Surgery

## 2021-03-30 ENCOUNTER — Encounter: Payer: Self-pay | Admitting: Vascular Surgery

## 2021-03-30 ENCOUNTER — Encounter
Admission: RE | Disposition: A | Discharge status: Home / Self Care | Payer: Self-pay | Source: Home / Self Care | Attending: Vascular Surgery

## 2021-03-30 ENCOUNTER — Other Ambulatory Visit: Payer: Self-pay

## 2021-03-30 DIAGNOSIS — Z4589 Encounter for adjustment and management of other implanted devices: Secondary | ICD-10-CM | POA: Diagnosis not present

## 2021-03-30 DIAGNOSIS — Z86711 Personal history of pulmonary embolism: Secondary | ICD-10-CM | POA: Insufficient documentation

## 2021-03-30 DIAGNOSIS — E785 Hyperlipidemia, unspecified: Secondary | ICD-10-CM | POA: Insufficient documentation

## 2021-03-30 DIAGNOSIS — Z881 Allergy status to other antibiotic agents status: Secondary | ICD-10-CM | POA: Insufficient documentation

## 2021-03-30 DIAGNOSIS — Z86718 Personal history of other venous thrombosis and embolism: Secondary | ICD-10-CM | POA: Diagnosis not present

## 2021-03-30 DIAGNOSIS — M159 Polyosteoarthritis, unspecified: Secondary | ICD-10-CM | POA: Diagnosis not present

## 2021-03-30 DIAGNOSIS — I2510 Atherosclerotic heart disease of native coronary artery without angina pectoris: Secondary | ICD-10-CM | POA: Insufficient documentation

## 2021-03-30 DIAGNOSIS — Z90710 Acquired absence of both cervix and uterus: Secondary | ICD-10-CM | POA: Insufficient documentation

## 2021-03-30 DIAGNOSIS — I872 Venous insufficiency (chronic) (peripheral): Secondary | ICD-10-CM | POA: Diagnosis not present

## 2021-03-30 DIAGNOSIS — Z96652 Presence of left artificial knee joint: Secondary | ICD-10-CM | POA: Insufficient documentation

## 2021-03-30 DIAGNOSIS — I82409 Acute embolism and thrombosis of unspecified deep veins of unspecified lower extremity: Secondary | ICD-10-CM

## 2021-03-30 DIAGNOSIS — Z8616 Personal history of COVID-19: Secondary | ICD-10-CM | POA: Diagnosis not present

## 2021-03-30 DIAGNOSIS — I10 Essential (primary) hypertension: Secondary | ICD-10-CM | POA: Insufficient documentation

## 2021-03-30 HISTORY — PX: IVC FILTER REMOVAL: CATH118246

## 2021-03-30 SURGERY — IVC FILTER REMOVAL
Anesthesia: Moderate Sedation

## 2021-03-30 MED ORDER — DIPHENHYDRAMINE HCL 50 MG/ML IJ SOLN: 50.0000 mg | Freq: Once | INTRAMUSCULAR | Status: DC | PRN

## 2021-03-30 MED ORDER — HYDROMORPHONE HCL 1 MG/ML IJ SOLN: 1.0000 mg | Freq: Once | INTRAMUSCULAR | Status: DC | PRN

## 2021-03-30 MED ORDER — FENTANYL CITRATE (PF) 100 MCG/2ML IJ SOLN
INTRAMUSCULAR | Status: DC | PRN
  Administered 2021-03-30: 50 ug via INTRAVENOUS

## 2021-03-30 MED ORDER — MIDAZOLAM HCL 2 MG/2ML IJ SOLN
INTRAMUSCULAR | Status: DC | PRN
  Administered 2021-03-30: 2 mg via INTRAVENOUS

## 2021-03-30 MED ORDER — MIDAZOLAM HCL 2 MG/ML PO SYRP: 8.0000 mg | ORAL_SOLUTION | Freq: Once | ORAL | Status: DC | PRN

## 2021-03-30 MED ORDER — MIDAZOLAM HCL 2 MG/2ML IJ SOLN
INTRAMUSCULAR | Status: AC
  Filled 2021-03-30: qty 2

## 2021-03-30 MED ORDER — IODIXANOL 320 MG/ML IV SOLN
INTRAVENOUS | Status: DC | PRN
  Administered 2021-03-30: 15 mL

## 2021-03-30 MED ORDER — CEFAZOLIN SODIUM-DEXTROSE 2-4 GM/100ML-% IV SOLN
INTRAVENOUS | Status: AC
  Filled 2021-03-30: qty 100

## 2021-03-30 MED ORDER — CEFAZOLIN SODIUM-DEXTROSE 2-4 GM/100ML-% IV SOLN
2.0000 g | Freq: Once | INTRAVENOUS | Status: AC
  Administered 2021-03-30: 2 g via INTRAVENOUS

## 2021-03-30 MED ORDER — FENTANYL CITRATE (PF) 100 MCG/2ML IJ SOLN
INTRAMUSCULAR | Status: AC
  Filled 2021-03-30: qty 2

## 2021-03-30 MED ORDER — FAMOTIDINE 20 MG PO TABS: 40.0000 mg | ORAL_TABLET | Freq: Once | ORAL | Status: DC | PRN

## 2021-03-30 MED ORDER — ONDANSETRON HCL 4 MG/2ML IJ SOLN: 4.0000 mg | Freq: Four times a day (QID) | INTRAMUSCULAR | Status: DC | PRN

## 2021-03-30 MED ORDER — METHYLPREDNISOLONE SODIUM SUCC 125 MG IJ SOLR: 125.0000 mg | Freq: Once | INTRAMUSCULAR | Status: DC | PRN

## 2021-03-30 MED ORDER — SODIUM CHLORIDE 0.9 % IV SOLN: INTRAVENOUS | Status: DC

## 2021-03-30 SURGICAL SUPPLY — 6 items
CANNULA 5F STIFF (CANNULA) ×2 IMPLANT
COVER PROBE U/S 5X48 (MISCELLANEOUS) ×2 IMPLANT
PACK ANGIOGRAPHY (CUSTOM PROCEDURE TRAY) ×2 IMPLANT
SET VENACAVA FILTER RETRIEVAL (MISCELLANEOUS) ×2 IMPLANT
TOWEL OR 17X26 4PK STRL BLUE (TOWEL DISPOSABLE) ×2 IMPLANT
WIRE GUIDERIGHT .035X150 (WIRE) ×2 IMPLANT

## 2021-03-30 NOTE — Discharge Instructions (Signed)
Inferior Vena Cava Filter Removal, Care After This sheet gives you information about how to care for yourself after your procedure. Your health care provider may also give you more specific instructions. If you have problems or questions, contact your health care provider. What can I expect after the procedure? After the procedure, it is common to have:  Mild pain and bruising around the area where the long, thin tube (catheter) was inserted in your neck or groin.  Tiredness (fatigue). Follow these instructions at home: Insertion site care  Follow instructions from your health care provider about how to take care of your catheter insertion site. Make sure you: ? Wash your hands with soap and water for at least 20 seconds before and after you change your bandage (dressing). If soap and water are not available, use hand sanitizer. ? Change your dressing as told by your health care provider.  Check your insertion site every day for signs of infection. Check for: ? Redness, swelling, or more pain. ? Fluid or blood. ? Warmth. ? Pus or a bad smell.   General instructions  Take over-the-counter and prescription medicines only as told by your health care provider.  Do not take baths, swim, or use a hot tub until your health care provider approves. Ask your health care provider if you may take showers.  If you were given a sedative during the procedure, it can affect you for several hours. Do not drive or operate machinery until your health care provider says that it is safe.  Return to your normal activities as told by your health care provider. Ask your health care provider what activities are safe for you.  Keep all follow-up visits as told by your health care provider. This is important. Contact a health care provider if:  You have chills or a fever.  You have redness, swelling, or more pain around your catheter insertion site.  Your insertion site feels warm to the touch.  You have  pus or a bad smell coming from your insertion site. Get help right away if:  You have blood coming from your catheter insertion site (active bleeding). ? If you have bleeding from the insertion site, lie down, apply pressure to the area with a clean cloth or gauze, and get help right away.  You have chest pain.  You have difficulty breathing. Summary  Follow instructions from your health care provider about how to take care of your catheter insertion site.  Return to your normal activities as told by your health care provider.  Check your catheter insertion site every day for signs of infection.  Get help right away if you have active bleeding, chest pain, or trouble breathing. This information is not intended to replace advice given to you by your health care provider. Make sure you discuss any questions you have with your health care provider. Document Revised: 10/09/2019 Document Reviewed: 10/09/2019 Elsevier Patient Education  2021 [employer].

## 2021-03-30 NOTE — Interval H&P Note (Signed)
History and Physical Interval Note:  03/30/2021 9:12 AM  Kiara Perry  has presented today for surgery, with the diagnosis of IVC filter removal   DVT.  The various methods of treatment have been discussed with the patient and family. After consideration of risks, benefits and other options for treatment, the patient has consented to  Procedure(s): IVC FILTER REMOVAL (N/A) as a surgical intervention.  The patient's history has been reviewed, patient examined, no change in status, stable for surgery.  I have reviewed the patient's chart and labs.  Questions were answered to the patient's satisfaction.     Hortencia Pilar

## 2021-03-30 NOTE — Op Note (Signed)
  OPERATIVE NOTE   PRE-OPERATIVE DIAGNOSIS: History of PE; recent joint replacement surgery  POST-OPERATIVE DIAGNOSIS: Same  PROCEDURE: 1. Retrieval of IVC Filter 2. Inferior Vena Cavagram  SURGEON: Katha Cabal, M.D.  ANESTHESIA:  Conscious sedation was administered under my direct supervision by the interventional radiology RN. IV Versed plus fentanyl were utilized. Continuous ECG, pulse oximetry and blood pressure was monitored throughout the entire procedure. Conscious sedation was for a total of 24 minutes.  ESTIMATED BLOOD LOSS: Minimal cc  FINDING(S):inferior vena cava is widely patent filter is in place in good position. Filter is removed without incident  SPECIMEN(S):  IVC filter intact  Fluoroscopy time: 0.6 minutes.  Contrast: 15 mils  INDICATIONS:   Kiara Perry is a 86 y.o. female who presents with DVT and PE. The patient has now tolerated anticoagulation for several months. Therefore, the IVC filter is recommended to be removed. The risks and benefits were reviewed with the patient all questions were answered and they agreed to proceed with IVC filter retrieval. Oral anticoagulation will be continued.  DESCRIPTION: After obtaining full informed written consent, the patient was brought back to the Special Procedure Suite and placed in the supine position.  The patient received IV antibiotics prior to induction.  After obtaining adequate sedation, the patient was prepped and draped in the standard fashion and appropriate time out is called.     Ultrasound was placed in a sterile sleeve.The right neck was then imaged with ultrasound.   Jugular vein was identified it is echolucent and homogeneous indicating patency. 1% lidocaine is then infiltrated under ultrasound visualization and subsequently a Seldinger needle is inserted under real-time ultrasound guidance.  J-wire is then advanced into the inferior vena cava under fluoroscopic guidance. With the tip of the  sheath at the confluence of the iliac veins inferior vena caval imaging is performed.  After review of the image the sheath is repositioned to above the filter and the snares introduced. Snares opened and the hook is secured without difficulty. The filter is then collapsed within the sheath and removed without difficulty.  Sheath is removed by pressures held the patient tolerated the procedure well and there were no immediate complications.  Interpretation: inferior vena cava is widely patent filter is in place in good position. Filter is removed without incident.     COMPLICATIONS: None  CONDITION: Kiara Perry, M.D. [facility] Office: [phone]   03/30/2021, 11:15 AM

## 2021-03-31 ENCOUNTER — Encounter: Payer: Self-pay | Admitting: Vascular Surgery

## 2021-04-05 DIAGNOSIS — Z86010 Personal history of colonic polyps: Secondary | ICD-10-CM | POA: Diagnosis not present

## 2021-04-05 DIAGNOSIS — Z800 Family history of malignant neoplasm of digestive organs: Secondary | ICD-10-CM | POA: Diagnosis not present

## 2021-04-09 DIAGNOSIS — R899 Unspecified abnormal finding in specimens from other organs, systems and tissues: Secondary | ICD-10-CM | POA: Diagnosis not present

## 2021-04-20 DIAGNOSIS — S8991XA Unspecified injury of right lower leg, initial encounter: Secondary | ICD-10-CM | POA: Diagnosis not present

## 2021-04-20 DIAGNOSIS — M1711 Unilateral primary osteoarthritis, right knee: Secondary | ICD-10-CM | POA: Diagnosis not present

## 2021-04-29 ENCOUNTER — Other Ambulatory Visit: Payer: Self-pay

## 2021-04-29 ENCOUNTER — Ambulatory Visit (INDEPENDENT_AMBULATORY_CARE_PROVIDER_SITE_OTHER): Payer: PPO | Admitting: Vascular Surgery

## 2021-04-29 VITALS — BP 138/69 | HR 57 | Resp 16 | Wt 179.0 lb

## 2021-04-29 DIAGNOSIS — I890 Lymphedema, not elsewhere classified: Secondary | ICD-10-CM

## 2021-04-29 DIAGNOSIS — I10 Essential (primary) hypertension: Secondary | ICD-10-CM

## 2021-04-29 DIAGNOSIS — I25118 Atherosclerotic heart disease of native coronary artery with other forms of angina pectoris: Secondary | ICD-10-CM | POA: Diagnosis not present

## 2021-04-29 DIAGNOSIS — M8949 Other hypertrophic osteoarthropathy, multiple sites: Secondary | ICD-10-CM | POA: Diagnosis not present

## 2021-04-29 DIAGNOSIS — M159 Polyosteoarthritis, unspecified: Secondary | ICD-10-CM

## 2021-04-29 DIAGNOSIS — Z86711 Personal history of pulmonary embolism: Secondary | ICD-10-CM | POA: Diagnosis not present

## 2021-04-29 NOTE — Progress Notes (Signed)
MRN : [id number]  Kiara Perry is a 86 y.o. (11/20/1934) female who presents with chief complaint of No chief complaint on file. Marland Kitchen  History of Present Illness:   Patient returns to the office for follow-up status post retrieval of IVC filter.  PROCEDURE 03/30/2021: Retrieval of IVC Filter Inferior Vena Cavagram  She has a past history of DVT and PE. The patient has now tolerated anticoagulation for months. She follows up with Dr Grayland Ormond.  There are no problems s/p filter removal  No outpatient medications have been marked as taking for the 04/29/21 encounter (Appointment) with Delana Meyer, Dolores Lory, MD.    Past Medical History:  Diagnosis Date   Anginal pain ([place])    Aortic atherosclerosis ([employer])    Arthritis    Coronary artery disease    COVID-19 09/2019   GERD (gastroesophageal reflux disease)    Heart murmur    History of pulmonary embolus (PE)    Hyperlipidemia    Hypertension    Pneumonia     Past Surgical History:  Procedure Laterality Date   ABDOMINAL HYSTERECTOMY     APPENDECTOMY     BACK SURGERY     BREAST BIOPSY Left 1973   neg   BREAST CYST ASPIRATION Left 1970   neg   COLONOSCOPY     ESOPHAGOGASTRODUODENOSCOPY     EYE SURGERY     cateract   IVC FILTER INSERTION N/A 10/13/2020   Procedure: IVC FILTER INSERTION;  Surgeon: Katha Cabal, MD;  Location: [place] [facility];  Service: Cardiovascular;  Laterality: N/A;   IVC FILTER REMOVAL N/A 03/30/2021   Procedure: IVC FILTER REMOVAL;  Surgeon: Katha Cabal, MD;  Location: [place] [facility];  Service: Cardiovascular;  Laterality: N/A;   KNEE ARTHROPLASTY Left 01/11/2021   Procedure: COMPUTER ASSISTED TOTAL KNEE ARTHROPLASTY - RNFA;  Surgeon: Dereck Leep, MD;  Location: [facility];  Service: Orthopedics;  Laterality: Left;   MENISCUS REPAIR     right knee   TONSILLECTOMY      Social History Social History   Tobacco Use   Smoking status: Never   Smokeless tobacco: Never  Vaping  Use   Vaping Use: Never used  Substance Use Topics   Alcohol use: Never   Drug use: Never    Family History Family History  Problem Relation Age of Onset   Breast cancer Sister 40    Allergies  Allergen Reactions   Levofloxacin Nausea Only     REVIEW OF SYSTEMS (Negative unless checked)  Constitutional: [] Weight loss  [] Fever  [] Chills Cardiac: [] Chest pain   [] Chest pressure   [] Palpitations   [] Shortness of breath when laying flat   [] Shortness of breath with exertion. Vascular:  [] Pain in legs with walking   [] Pain in legs at rest  [x] History of DVT   [] Phlebitis   [x] Swelling in legs   [] Varicose veins   [] Non-healing ulcers Pulmonary:   [] Uses home oxygen   [] Productive cough   [] Hemoptysis   [] Wheeze  [] COPD   [] Asthma Neurologic:  [] Dizziness   [] Seizures   [] History of stroke   [] History of TIA  [] Aphasia   [] Vissual changes   [] Weakness or numbness in arm   [] Weakness or numbness in leg Musculoskeletal:   [] Joint swelling   [x] Joint pain   [] Low back pain Hematologic:  [] Easy bruising  [] Easy bleeding   [] Hypercoagulable state   [] Anemic Gastrointestinal:  [] Diarrhea   [] Vomiting  [x] Gastroesophageal reflux/heartburn   [] Difficulty swallowing.  Genitourinary:  [] Chronic kidney disease   [] Difficult urination  [] Frequent urination   [] Blood in urine Skin:  [] Rashes   [] Ulcers  Psychological:  [] History of anxiety   []  History of major depression.  Physical Examination  There were no vitals filed for this visit. There is no height or weight on file to calculate BMI. Gen: WD/WN, NAD Head: [place]/AT, No temporalis wasting.  Ear/Nose/Throat: Hearing grossly intact, nares w/o erythema or drainage Eyes: PER, EOMI, sclera nonicteric.  Neck: Supple, no large masses.   Pulmonary:  Good air movement, no audible wheezing bilaterally, no use of accessory muscles.  Cardiac: RRR, no JVD Vascular:  Neck CD&I Vessel Right Left  Radial Palpable Palpable  Gastrointestinal:  Non-distended. No guarding/no peritoneal signs.  Musculoskeletal: M/S 5/5 throughout.  No deformity or atrophy.  Neurologic: CN 2-12 intact. Symmetrical.  Speech is fluent. Motor exam as listed above. Psychiatric: Judgment intact, Mood & affect appropriate for pt's clinical situation. Dermatologic: No rashes or ulcers noted.  No changes consistent with cellulitis.   CBC Lab Results  Component Value Date   WBC 6.3 01/05/2021   HGB 12.6 01/05/2021   HCT 38.4 01/05/2021   MCV 91.4 01/05/2021   PLT 262 01/05/2021    BMET    Component Value Date/Time   NA 135 01/05/2021 1144   K 4.1 01/05/2021 1144   CL 99 01/05/2021 1144   CO2 28 01/05/2021 1144   GLUCOSE 97 01/05/2021 1144   BUN 19 01/05/2021 1144   CREATININE 1.02 (H) 01/05/2021 1144   CALCIUM 9.5 01/05/2021 1144   GFRNONAA 55 (L) 01/05/2021 1144   CrCl cannot be calculated (Patient's most recent lab result is older than the maximum 21 days allowed.).  COAG Lab Results  Component Value Date   INR 0.9 01/05/2021    Radiology PERIPHERAL VASCULAR CATHETERIZATION  Result Date: 03/30/2021 See surgical note for result.    Assessment/Plan 1. History of pulmonary embolus (PE) Recommend:   No surgery or intervention at this point in time.  IVC filter has been removed  Patient's duplex ultrasound of the venous system shows DVT from the popliteal to the femoral veins.  The patient is on anticoagulation and will follow up with her primary service and hematology  Elevation was stressed, use of a recliner was discussed.  I have had a long discussion with the patient regarding DVT and post phlebitic changes such as swelling and why it  causes symptoms such as pain.  The patient will wear graduated compression stockings class 1 (20-30 mmHg), beginning after three full days of anticoagulation, on a daily basis a prescription was given. The patient will  beginning wearing the stockings first thing in the morning and removing them  in the evening. The patient is instructed specifically not to sleep in the stockings.  In addition, behavioral modification including elevation during the day and avoidance of prolonged dependency will be initiated.    The patient will continue anticoagulation for now as there have not been any problems or complications at this point.   She will follow up with me PRN   2. Lymphedema Recommend:   No surgery or intervention at this point in time.  IVC filter has been removed  Patient's duplex ultrasound of the venous system shows DVT from the popliteal to the femoral veins.  The patient is on anticoagulation and will follow up with her primary service and hematology  Elevation was stressed, use of a recliner was discussed.  I have had a  long discussion with the patient regarding DVT and post phlebitic changes such as swelling and why it  causes symptoms such as pain.  The patient will wear graduated compression stockings class 1 (20-30 mmHg), beginning after three full days of anticoagulation, on a daily basis a prescription was given. The patient will  beginning wearing the stockings first thing in the morning and removing them in the evening. The patient is instructed specifically not to sleep in the stockings.  In addition, behavioral modification including elevation during the day and avoidance of prolonged dependency will be initiated.    The patient will continue anticoagulation for now as there have not been any problems or complications at this point.   She will follow up with me PRN   3. Primary osteoarthritis involving multiple joints Continue NSAID medications as already ordered, these medications have been reviewed and there are no changes at this time.  Continued activity and therapy was stressed.   4. Coronary artery disease of native artery of native heart with stable angina pectoris (HCC) Continue cardiac and antihypertensive medications as already ordered and reviewed, no  changes at this time.  Continue statin as ordered and reviewed, no changes at this time  Nitrates PRN for chest pain   5. Primary hypertension Continue antihypertensive medications as already ordered, these medications have been reviewed and there are no changes at this time.     Hortencia Pilar, MD  04/29/2021 8:32 AM

## 2021-05-01 ENCOUNTER — Encounter (INDEPENDENT_AMBULATORY_CARE_PROVIDER_SITE_OTHER): Payer: Self-pay | Admitting: Vascular Surgery

## 2021-06-03 ENCOUNTER — Other Ambulatory Visit: Payer: Self-pay | Admitting: Physical Medicine and Rehabilitation

## 2021-06-03 DIAGNOSIS — M5416 Radiculopathy, lumbar region: Secondary | ICD-10-CM

## 2021-06-03 DIAGNOSIS — M48062 Spinal stenosis, lumbar region with neurogenic claudication: Secondary | ICD-10-CM | POA: Diagnosis not present

## 2021-06-04 ENCOUNTER — Other Ambulatory Visit: Payer: Self-pay | Admitting: Internal Medicine

## 2021-06-04 DIAGNOSIS — I2510 Atherosclerotic heart disease of native coronary artery without angina pectoris: Secondary | ICD-10-CM | POA: Diagnosis not present

## 2021-06-04 DIAGNOSIS — Z1231 Encounter for screening mammogram for malignant neoplasm of breast: Secondary | ICD-10-CM | POA: Diagnosis not present

## 2021-06-04 DIAGNOSIS — I10 Essential (primary) hypertension: Secondary | ICD-10-CM | POA: Diagnosis not present

## 2021-06-04 DIAGNOSIS — Z79899 Other long term (current) drug therapy: Secondary | ICD-10-CM | POA: Diagnosis not present

## 2021-06-04 DIAGNOSIS — G894 Chronic pain syndrome: Secondary | ICD-10-CM | POA: Diagnosis not present

## 2021-06-04 DIAGNOSIS — E782 Mixed hyperlipidemia: Secondary | ICD-10-CM | POA: Diagnosis not present

## 2021-06-12 ENCOUNTER — Ambulatory Visit
Admission: RE | Admit: 2021-06-12 | Discharge: 2021-06-12 | Disposition: A | Discharge status: Home / Self Care | Payer: PPO | Source: Ambulatory Visit | Attending: Physical Medicine and Rehabilitation | Admitting: Physical Medicine and Rehabilitation

## 2021-06-12 ENCOUNTER — Other Ambulatory Visit: Payer: Self-pay

## 2021-06-12 DIAGNOSIS — M5416 Radiculopathy, lumbar region: Secondary | ICD-10-CM

## 2021-06-12 DIAGNOSIS — M545 Low back pain, unspecified: Secondary | ICD-10-CM | POA: Diagnosis not present

## 2021-06-16 DIAGNOSIS — M5416 Radiculopathy, lumbar region: Secondary | ICD-10-CM | POA: Diagnosis not present

## 2021-06-16 DIAGNOSIS — M48062 Spinal stenosis, lumbar region with neurogenic claudication: Secondary | ICD-10-CM | POA: Diagnosis not present

## 2021-07-12 DIAGNOSIS — M5136 Other intervertebral disc degeneration, lumbar region: Secondary | ICD-10-CM | POA: Diagnosis not present

## 2021-07-12 DIAGNOSIS — M4726 Other spondylosis with radiculopathy, lumbar region: Secondary | ICD-10-CM | POA: Diagnosis not present

## 2021-07-12 DIAGNOSIS — M48062 Spinal stenosis, lumbar region with neurogenic claudication: Secondary | ICD-10-CM | POA: Diagnosis not present

## 2021-07-12 DIAGNOSIS — M5416 Radiculopathy, lumbar region: Secondary | ICD-10-CM | POA: Diagnosis not present

## 2021-07-21 DIAGNOSIS — E782 Mixed hyperlipidemia: Secondary | ICD-10-CM | POA: Diagnosis not present

## 2021-07-21 DIAGNOSIS — Z23 Encounter for immunization: Secondary | ICD-10-CM | POA: Diagnosis not present

## 2021-07-21 DIAGNOSIS — I700 Atherosclerosis of aorta: Secondary | ICD-10-CM | POA: Diagnosis not present

## 2021-07-21 DIAGNOSIS — R001 Bradycardia, unspecified: Secondary | ICD-10-CM | POA: Diagnosis not present

## 2021-07-21 DIAGNOSIS — I10 Essential (primary) hypertension: Secondary | ICD-10-CM | POA: Diagnosis not present

## 2021-07-21 DIAGNOSIS — I2510 Atherosclerotic heart disease of native coronary artery without angina pectoris: Secondary | ICD-10-CM | POA: Diagnosis not present

## 2021-07-22 DIAGNOSIS — M5136 Other intervertebral disc degeneration, lumbar region: Secondary | ICD-10-CM | POA: Diagnosis not present

## 2021-07-22 DIAGNOSIS — M545 Low back pain, unspecified: Secondary | ICD-10-CM | POA: Diagnosis not present

## 2021-07-23 DIAGNOSIS — M25571 Pain in right ankle and joints of right foot: Secondary | ICD-10-CM | POA: Diagnosis not present

## 2021-07-23 DIAGNOSIS — S92354A Nondisplaced fracture of fifth metatarsal bone, right foot, initial encounter for closed fracture: Secondary | ICD-10-CM | POA: Diagnosis not present

## 2021-07-23 DIAGNOSIS — S99911A Unspecified injury of right ankle, initial encounter: Secondary | ICD-10-CM | POA: Diagnosis not present

## 2021-07-23 DIAGNOSIS — M7989 Other specified soft tissue disorders: Secondary | ICD-10-CM | POA: Diagnosis not present

## 2021-07-23 DIAGNOSIS — M79671 Pain in right foot: Secondary | ICD-10-CM | POA: Diagnosis not present

## 2021-07-23 DIAGNOSIS — S99921A Unspecified injury of right foot, initial encounter: Secondary | ICD-10-CM | POA: Diagnosis not present

## 2021-07-27 DIAGNOSIS — S92354A Nondisplaced fracture of fifth metatarsal bone, right foot, initial encounter for closed fracture: Secondary | ICD-10-CM | POA: Diagnosis not present

## 2021-07-27 DIAGNOSIS — S90122A Contusion of left lesser toe(s) without damage to nail, initial encounter: Secondary | ICD-10-CM | POA: Diagnosis not present

## 2021-07-27 DIAGNOSIS — M5416 Radiculopathy, lumbar region: Secondary | ICD-10-CM | POA: Diagnosis not present

## 2021-07-27 DIAGNOSIS — M79671 Pain in right foot: Secondary | ICD-10-CM | POA: Diagnosis not present

## 2021-07-27 DIAGNOSIS — M79672 Pain in left foot: Secondary | ICD-10-CM | POA: Diagnosis not present

## 2021-07-27 DIAGNOSIS — M799 Soft tissue disorder, unspecified: Secondary | ICD-10-CM | POA: Diagnosis not present

## 2021-07-27 DIAGNOSIS — M25474 Effusion, right foot: Secondary | ICD-10-CM | POA: Diagnosis not present

## 2021-07-27 DIAGNOSIS — S9032XA Contusion of left foot, initial encounter: Secondary | ICD-10-CM | POA: Diagnosis not present

## 2021-07-27 DIAGNOSIS — M5136 Other intervertebral disc degeneration, lumbar region: Secondary | ICD-10-CM | POA: Diagnosis not present

## 2021-07-27 DIAGNOSIS — M545 Low back pain, unspecified: Secondary | ICD-10-CM | POA: Diagnosis not present

## 2021-07-27 DIAGNOSIS — R262 Difficulty in walking, not elsewhere classified: Secondary | ICD-10-CM | POA: Diagnosis not present

## 2021-08-18 DIAGNOSIS — Z86010 Personal history of colonic polyps: Secondary | ICD-10-CM | POA: Diagnosis not present

## 2021-08-18 DIAGNOSIS — Z85038 Personal history of other malignant neoplasm of large intestine: Secondary | ICD-10-CM | POA: Diagnosis not present

## 2021-08-18 DIAGNOSIS — K648 Other hemorrhoids: Secondary | ICD-10-CM | POA: Diagnosis not present

## 2021-08-18 DIAGNOSIS — D125 Benign neoplasm of sigmoid colon: Secondary | ICD-10-CM | POA: Diagnosis not present

## 2021-08-18 DIAGNOSIS — K5730 Diverticulosis of large intestine without perforation or abscess without bleeding: Secondary | ICD-10-CM | POA: Diagnosis not present

## 2021-08-18 DIAGNOSIS — K644 Residual hemorrhoidal skin tags: Secondary | ICD-10-CM | POA: Diagnosis not present

## 2021-08-18 DIAGNOSIS — Z1509 Genetic susceptibility to other malignant neoplasm: Secondary | ICD-10-CM | POA: Diagnosis not present

## 2021-08-18 DIAGNOSIS — D123 Benign neoplasm of transverse colon: Secondary | ICD-10-CM | POA: Diagnosis not present

## 2021-08-19 DIAGNOSIS — M79671 Pain in right foot: Secondary | ICD-10-CM | POA: Diagnosis not present

## 2021-08-19 DIAGNOSIS — R262 Difficulty in walking, not elsewhere classified: Secondary | ICD-10-CM | POA: Diagnosis not present

## 2021-08-19 DIAGNOSIS — M25474 Effusion, right foot: Secondary | ICD-10-CM | POA: Diagnosis not present

## 2021-08-19 DIAGNOSIS — S92354D Nondisplaced fracture of fifth metatarsal bone, right foot, subsequent encounter for fracture with routine healing: Secondary | ICD-10-CM | POA: Diagnosis not present

## 2021-08-20 DIAGNOSIS — D125 Benign neoplasm of sigmoid colon: Secondary | ICD-10-CM | POA: Diagnosis not present

## 2021-09-02 ENCOUNTER — Ambulatory Visit
Admission: RE | Admit: 2021-09-02 | Discharge: 2021-09-02 | Disposition: A | Discharge status: Home / Self Care | Payer: PPO | Source: Ambulatory Visit | Attending: Internal Medicine | Admitting: Internal Medicine

## 2021-09-02 ENCOUNTER — Other Ambulatory Visit: Payer: Self-pay

## 2021-09-02 DIAGNOSIS — Z1231 Encounter for screening mammogram for malignant neoplasm of breast: Secondary | ICD-10-CM | POA: Diagnosis not present

## 2021-09-08 DIAGNOSIS — M545 Low back pain, unspecified: Secondary | ICD-10-CM | POA: Diagnosis not present

## 2021-09-08 DIAGNOSIS — M5136 Other intervertebral disc degeneration, lumbar region: Secondary | ICD-10-CM | POA: Diagnosis not present

## 2021-09-08 DIAGNOSIS — Z13820 Encounter for screening for osteoporosis: Secondary | ICD-10-CM | POA: Diagnosis not present

## 2021-09-08 DIAGNOSIS — M542 Cervicalgia: Secondary | ICD-10-CM | POA: Diagnosis not present

## 2021-09-08 DIAGNOSIS — I10 Essential (primary) hypertension: Secondary | ICD-10-CM | POA: Diagnosis not present

## 2021-09-08 DIAGNOSIS — I2510 Atherosclerotic heart disease of native coronary artery without angina pectoris: Secondary | ICD-10-CM | POA: Diagnosis not present

## 2021-09-08 DIAGNOSIS — I890 Lymphedema, not elsewhere classified: Secondary | ICD-10-CM | POA: Diagnosis not present

## 2021-09-08 DIAGNOSIS — E782 Mixed hyperlipidemia: Secondary | ICD-10-CM | POA: Diagnosis not present

## 2021-09-08 DIAGNOSIS — G894 Chronic pain syndrome: Secondary | ICD-10-CM | POA: Diagnosis not present

## 2021-09-08 DIAGNOSIS — Z79899 Other long term (current) drug therapy: Secondary | ICD-10-CM | POA: Diagnosis not present

## 2021-09-08 DIAGNOSIS — M810 Age-related osteoporosis without current pathological fracture: Secondary | ICD-10-CM | POA: Diagnosis not present

## 2021-09-08 DIAGNOSIS — M5440 Lumbago with sciatica, unspecified side: Secondary | ICD-10-CM | POA: Diagnosis not present

## 2021-09-08 DIAGNOSIS — Z0000 Encounter for general adult medical examination without abnormal findings: Secondary | ICD-10-CM | POA: Diagnosis not present

## 2021-09-09 DIAGNOSIS — Z86711 Personal history of pulmonary embolism: Secondary | ICD-10-CM | POA: Diagnosis not present

## 2021-09-09 DIAGNOSIS — M79671 Pain in right foot: Secondary | ICD-10-CM | POA: Diagnosis not present

## 2021-09-09 DIAGNOSIS — M25474 Effusion, right foot: Secondary | ICD-10-CM | POA: Diagnosis not present

## 2021-09-09 DIAGNOSIS — S92354D Nondisplaced fracture of fifth metatarsal bone, right foot, subsequent encounter for fracture with routine healing: Secondary | ICD-10-CM | POA: Diagnosis not present

## 2021-09-09 DIAGNOSIS — G894 Chronic pain syndrome: Secondary | ICD-10-CM | POA: Diagnosis not present

## 2021-09-09 DIAGNOSIS — R262 Difficulty in walking, not elsewhere classified: Secondary | ICD-10-CM | POA: Diagnosis not present

## 2021-09-10 ENCOUNTER — Other Ambulatory Visit: Payer: Self-pay | Admitting: Podiatry

## 2021-09-14 ENCOUNTER — Telehealth (INDEPENDENT_AMBULATORY_CARE_PROVIDER_SITE_OTHER): Payer: Self-pay

## 2021-09-14 ENCOUNTER — Encounter: Payer: Self-pay | Admitting: Podiatry

## 2021-09-14 NOTE — Telephone Encounter (Signed)
Patient left a message stating that Dr Luana Shu office was suppose to be in touch with Dr Delana Meyer to see if patient needed to be schedule for ivc filter before surgery. Patient is schedule for foot surgery on 09/23/21. Patient was informed that Dr Delana Meyer hasn't spoke with Dr Luana Shu. Patient stated that she would be contacting their office.

## 2021-09-15 NOTE — Discharge Instructions (Addendum)
[facility]  POST OPERATIVE INSTRUCTIONS FOR DR. Vickki Muff AND DR. [place]   Take your medication as prescribed.  Pain medication should be taken only as needed.  Continue usage of bone stimulator per manufacturer protocol daily.  Keep the dressing clean, dry and intact.  Remain nonweightbearing to the right lower extremity at all times.  Keep your foot elevated above the heart level for the first 48 hours.  Continue elevation thereafter to control swelling further.  You may also put ice to the anterior lateral aspect of the right foot and ankle for maximum 10 minutes out of every 1 hour as needed.  Always use crutches, knee scooter, wheelchair to get around to keep pressure off the right foot.  Do not take a shower. Baths are permissible as long as the foot is kept out of the water.   Every hour you are awake:  Bend your knee 15 times. Massage calf 15 times  Call [facility] [phone]) if any of the following problems occur: You develop a temperature or fever. The bandage becomes saturated with blood. Medication does not stop your pain. Injury of the foot occurs. Any symptoms of infection including redness, odor, or red streaks running from wound.   PERIPHERAL NERVE BLOCK PATIENT INFORMATION  Your surgeon has requested a peripheral nerve block for your surgery. This anesthetic technique provides excellent post-operative pain relief for you in a safe and effective manner. It will also help reduce the risk of nausea and vomiting and allow earlier discharge from the hospital.   The block is performed under sedation with ultrasound guidance prior to your procedure. Due to the sedation, your may or may not remember the block experience. The nerve block will begin to take effect anywhere from 5 to 30 minutes after being administered. You will be transported to the operating room from your surgery after the  block is completed.   At the end of surgery, when the anesthesia wears off, you will notice a few things. Your may not be able to move or feel the part of your body targeted by the nerve block. These are normal experiences, and they will disappear as the block wears off.  If you had an interscalene nerve block performed (which is common for shoulder surgery), your voice can be very hoarse and you may feel that you are not able to take as deep a breath as you did before surgery. Some patients may also notice a droopy eyelid on the affected side. These symptoms will resolve once the block wears off.  Pain control: The nerve block technique used is a single injection that can last anywhere from 1-3 days. The duration of the numbness can vary between individuals. After leaving the hospital, it is important that you begin to take your prescribed pain medication when you start to sense the nerve block wearing off. This will help you avoid unpleasant pain at the time the nerve block wears off, which can sometimes be in the middle of the night. The block will only cover pain in the areas targeted by the nerve block so if you experience surgical pain outside of that area, please take your prescribed pain medication. Management of the "numb area": After a nerve block, you cannot feel pain, pressure, or temperature in the affected area so there is an increased risk for injury. You should take extra care to protect the affected areas until sensation and movement returns. Please take caution  to not come in contact with extremely hot or cold items because you will not be able to sense or protect yourself form the extremes of temperature.  You may experience some persistent numbness after the procedure by most neurological deficits resolve over time and the incidence of serious long term neurological complications attributable to peripheral nerve blocks are relatively uncommon.

## 2021-09-23 ENCOUNTER — Ambulatory Visit
Admission: RE | Admit: 2021-09-23 | Discharge: 2021-09-23 | Disposition: A | Discharge status: Home / Self Care | Payer: PPO | Attending: Podiatry | Admitting: Podiatry

## 2021-09-23 ENCOUNTER — Encounter: Payer: Self-pay | Admitting: Podiatry

## 2021-09-23 ENCOUNTER — Encounter
Admission: RE | Disposition: A | Discharge status: Home / Self Care | Payer: Self-pay | Source: Home / Self Care | Attending: Podiatry

## 2021-09-23 ENCOUNTER — Ambulatory Visit: Payer: PPO | Admitting: Anesthesiology

## 2021-09-23 ENCOUNTER — Other Ambulatory Visit: Payer: Self-pay

## 2021-09-23 DIAGNOSIS — I10 Essential (primary) hypertension: Secondary | ICD-10-CM | POA: Diagnosis not present

## 2021-09-23 DIAGNOSIS — S92351G Displaced fracture of fifth metatarsal bone, right foot, subsequent encounter for fracture with delayed healing: Secondary | ICD-10-CM | POA: Insufficient documentation

## 2021-09-23 DIAGNOSIS — K219 Gastro-esophageal reflux disease without esophagitis: Secondary | ICD-10-CM | POA: Diagnosis not present

## 2021-09-23 DIAGNOSIS — S92354G Nondisplaced fracture of fifth metatarsal bone, right foot, subsequent encounter for fracture with delayed healing: Secondary | ICD-10-CM

## 2021-09-23 DIAGNOSIS — M1990 Unspecified osteoarthritis, unspecified site: Secondary | ICD-10-CM | POA: Diagnosis not present

## 2021-09-23 DIAGNOSIS — I2510 Atherosclerotic heart disease of native coronary artery without angina pectoris: Secondary | ICD-10-CM | POA: Insufficient documentation

## 2021-09-23 DIAGNOSIS — X58XXXD Exposure to other specified factors, subsequent encounter: Secondary | ICD-10-CM | POA: Insufficient documentation

## 2021-09-23 DIAGNOSIS — S92354A Nondisplaced fracture of fifth metatarsal bone, right foot, initial encounter for closed fracture: Secondary | ICD-10-CM | POA: Diagnosis not present

## 2021-09-23 DIAGNOSIS — M79671 Pain in right foot: Secondary | ICD-10-CM | POA: Diagnosis not present

## 2021-09-23 DIAGNOSIS — G8918 Other acute postprocedural pain: Secondary | ICD-10-CM | POA: Diagnosis not present

## 2021-09-23 HISTORY — DX: Presence of external hearing-aid: Z97.4

## 2021-09-23 HISTORY — PX: ORIF TOE FRACTURE: SHX5032

## 2021-09-23 HISTORY — DX: Presence of dental prosthetic device (complete) (partial): Z97.2

## 2021-09-23 SURGERY — OPEN REDUCTION INTERNAL FIXATION (ORIF) METATARSAL (TOE) FRACTURE
Anesthesia: General | Site: Toe | Laterality: Right

## 2021-09-23 MED ORDER — ONDANSETRON HCL 4 MG PO TABS: 4.0000 mg | ORAL_TABLET | Freq: Three times a day (TID) | ORAL | 0 refills | Status: AC | PRN

## 2021-09-23 MED ORDER — FENTANYL CITRATE PF 50 MCG/ML IJ SOSY: 25.0000 ug | PREFILLED_SYRINGE | INTRAMUSCULAR | Status: DC | PRN

## 2021-09-23 MED ORDER — PROPOFOL 500 MG/50ML IV EMUL
INTRAVENOUS | Status: DC | PRN
  Administered 2021-09-23: 50 ug/kg/min via INTRAVENOUS

## 2021-09-23 MED ORDER — AMOXICILLIN-POT CLAVULANATE 875-125 MG PO TABS: 1.0000 | ORAL_TABLET | Freq: Two times a day (BID) | ORAL | 0 refills | Status: AC

## 2021-09-23 MED ORDER — ROPIVACAINE HCL 5 MG/ML IJ SOLN
INTRAMUSCULAR | Status: DC | PRN
  Administered 2021-09-23: 40 mL via PERINEURAL

## 2021-09-23 MED ORDER — LACTATED RINGERS IV SOLN: INTRAVENOUS | Status: DC

## 2021-09-23 MED ORDER — HYDROCODONE-ACETAMINOPHEN 7.5-325 MG PO TABS: 1.0000 | ORAL_TABLET | Freq: Four times a day (QID) | ORAL | 0 refills | Status: AC | PRN

## 2021-09-23 MED ORDER — OXYCODONE HCL 5 MG PO TABS: 5.0000 mg | ORAL_TABLET | Freq: Once | ORAL | Status: DC | PRN

## 2021-09-23 MED ORDER — 0.9 % SODIUM CHLORIDE (POUR BTL) OPTIME
TOPICAL | Status: DC | PRN
  Administered 2021-09-23: 500 mL

## 2021-09-23 MED ORDER — OXYCODONE HCL 5 MG/5ML PO SOLN: 5.0000 mg | Freq: Once | ORAL | Status: DC | PRN

## 2021-09-23 MED ORDER — ONDANSETRON HCL 4 MG/2ML IJ SOLN
INTRAMUSCULAR | Status: DC | PRN
  Administered 2021-09-23: 4 mg via INTRAVENOUS

## 2021-09-23 MED ORDER — ENOXAPARIN SODIUM 40 MG/0.4ML IJ SOSY: 40.0000 mg | PREFILLED_SYRINGE | INTRAMUSCULAR | 0 refills | Status: DC

## 2021-09-23 MED ORDER — MIDAZOLAM HCL 2 MG/2ML IJ SOLN
INTRAMUSCULAR | Status: DC | PRN
  Administered 2021-09-23: 1 mg via INTRAVENOUS

## 2021-09-23 MED ORDER — PROMETHAZINE HCL 25 MG/ML IJ SOLN: 6.2500 mg | INTRAMUSCULAR | Status: DC | PRN

## 2021-09-23 MED ORDER — ACETAMINOPHEN 160 MG/5ML PO SOLN: 325.0000 mg | ORAL | Status: DC | PRN

## 2021-09-23 MED ORDER — FENTANYL CITRATE (PF) 100 MCG/2ML IJ SOLN
INTRAMUSCULAR | Status: DC | PRN
  Administered 2021-09-23: 50 ug via INTRAVENOUS

## 2021-09-23 MED ORDER — HYDROMORPHONE HCL 1 MG/ML IJ SOLN: 0.2500 mg | INTRAMUSCULAR | Status: DC | PRN

## 2021-09-23 MED ORDER — ACETAMINOPHEN 325 MG PO TABS: 325.0000 mg | ORAL_TABLET | ORAL | Status: DC | PRN

## 2021-09-23 MED ORDER — CEFAZOLIN SODIUM-DEXTROSE 2-4 GM/100ML-% IV SOLN
2.0000 g | INTRAVENOUS | Status: AC
  Administered 2021-09-23: 2 g via INTRAVENOUS

## 2021-09-23 MED ORDER — LIDOCAINE HCL (CARDIAC) PF 100 MG/5ML IV SOSY
PREFILLED_SYRINGE | INTRAVENOUS | Status: DC | PRN
  Administered 2021-09-23: 50 mg via INTRAVENOUS

## 2021-09-23 SURGICAL SUPPLY — 37 items
4.5 COUNTERSINK ×1 IMPLANT
BIT DRILL 3.2X150 CANNULATED (BIT) IMPLANT
BNDG CMPR STD VLCR NS LF 5.8X4 (GAUZE/BANDAGES/DRESSINGS) ×1
BNDG CMPR STD VLCR NS LF 5.8X6 (GAUZE/BANDAGES/DRESSINGS) ×1
BNDG ELASTIC 4X5.8 VLCR NS LF (GAUZE/BANDAGES/DRESSINGS) ×2 IMPLANT
BNDG ELASTIC 6X5.8 VLCR NS LF (GAUZE/BANDAGES/DRESSINGS) ×2 IMPLANT
BNDG ESMARK 4X12 TAN STRL LF (GAUZE/BANDAGES/DRESSINGS) ×2 IMPLANT
BNDG GAUZE ELAST 4 BULKY (GAUZE/BANDAGES/DRESSINGS) ×2 IMPLANT
CANISTER SUCT 1200ML W/VALVE (MISCELLANEOUS) ×2 IMPLANT
COVER LIGHT HANDLE FLEXIBLE (MISCELLANEOUS) ×4 IMPLANT
DRILL 3.2X150 CANNULATED (BIT) ×2
DURAPREP 26ML APPLICATOR (WOUND CARE) ×3 IMPLANT
ELECT REM PT RETURN 9FT ADLT (ELECTROSURGICAL) ×2
ELECTRODE REM PT RTRN 9FT ADLT (ELECTROSURGICAL) ×1 IMPLANT
GAUZE 4X4 16PLY ~~LOC~~+RFID DBL (SPONGE) ×2 IMPLANT
GAUZE SPONGE 4X4 12PLY STRL (GAUZE/BANDAGES/DRESSINGS) ×2 IMPLANT
GAUZE XEROFORM 1X8 LF (GAUZE/BANDAGES/DRESSINGS) ×2 IMPLANT
GLOVE SURG ENC MOIS LTX SZ7 (GLOVE) ×2 IMPLANT
GLOVE SURG GAMMEX PI TX LF 7.5 (GLOVE) ×1 IMPLANT
GLOVE SURG POLYISO LF SZ7 (GLOVE) ×2 IMPLANT
GLOVE SURG UNDER POLY LF SZ7.5 (GLOVE) ×1 IMPLANT
GOWN STRL REUS W/ TWL LRG LVL3 (GOWN DISPOSABLE) ×2 IMPLANT
GOWN STRL REUS W/TWL LRG LVL3 (GOWN DISPOSABLE) ×6
K-WIRE 1.6X20 (WIRE) ×4
KIT TURNOVER KIT A (KITS) ×2 IMPLANT
KWIRE 1.6X20 (WIRE) IMPLANT
NS IRRIG 500ML POUR BTL (IV SOLUTION) ×2 IMPLANT
PADDING CAST BLEND 4X4 NS (MISCELLANEOUS) ×6 IMPLANT
SCREW JONES 4.5 X 46 (Screw) ×1 IMPLANT
SPLINT CAST 1 STEP 4X30 (MISCELLANEOUS) ×2 IMPLANT
SPONGE T-LAP 18X18 ~~LOC~~+RFID (SPONGE) ×2 IMPLANT
STIMULATOR BONE (ORTHOPEDIC SUPPLIES) ×2
STIMULATOR BONE GROWTH EMG EXT (ORTHOPEDIC SUPPLIES) IMPLANT
STOCKINETTE ORTHO 6X25 (MISCELLANEOUS) ×2 IMPLANT
SUT ETHILON 3-0 FS-10 30 BLK (SUTURE) ×2
SUTURE EHLN 3-0 FS-10 30 BLK (SUTURE) IMPLANT
TAP 4.5MM CANNULATED (TAP) ×1 IMPLANT

## 2021-09-23 NOTE — Anesthesia Procedure Notes (Signed)
Procedure Name: MAC Date/Time: 09/23/2021 7:40 AM Performed by: Mayme Genta, CRNA Pre-anesthesia Checklist: Patient identified, Emergency Drugs available, Suction available, Patient being monitored and Timeout performed Patient Re-evaluated:Patient Re-evaluated prior to induction Oxygen Delivery Method: Simple face mask Placement Confirmation: positive ETCO2 and breath sounds checked- equal and bilateral

## 2021-09-23 NOTE — Anesthesia Postprocedure Evaluation (Signed)
Anesthesia Post Note  Patient: Kiara Perry  Procedure(s) Performed: OPEN REDUCTION INTERNAL FIXATION (ORIF) METATARSAL (TOE) FRACTURE - 5TH (Right: Toe)     Patient location during evaluation: PACU Anesthesia Type: General Level of consciousness: awake Pain management: pain level controlled Vital Signs Assessment: post-procedure vital signs reviewed and stable Respiratory status: respiratory function stable Cardiovascular status: stable Postop Assessment: no signs of nausea or vomiting Anesthetic complications: no   No notable events documented.  Veda Canning

## 2021-09-23 NOTE — Anesthesia Procedure Notes (Signed)
Anesthesia Regional Block: Popliteal block   Pre-Anesthetic Checklist: , timeout performed,  Correct Patient, Correct Site, Correct Laterality,  Correct Procedure, Correct Position, site marked,  Risks and benefits discussed,  Surgical consent,  Pre-op evaluation,  At surgeon's request and post-op pain management  Laterality: Right  Prep: chloraprep       Needles:  Injection technique: Single-shot  Needle Type: Echogenic Needle     Needle Length: 9cm  Needle Gauge: 21     Additional Needles:   Procedures:,,,, ultrasound used (permanent image in chart),,    Narrative:  Start time: 09/23/2021 7:09 AM End time: 09/23/2021 7:11 AM Injection made incrementally with aspirations every 5 mL.  Performed by: Personally  Anesthesiologist: Veda Canning, MD  Additional Notes: Functioning IV was confirmed and monitors applied. Ultrasound guidance: relevant anatomy identified, needle position confirmed, local anesthetic spread visualized around nerve(s)., vascular puncture avoided.  Image printed for medical record.  Negative aspiration and no paresthesias; incremental administration of local anesthetic. The patient tolerated the procedure well. Vitals signes recorded in RN notes.

## 2021-09-23 NOTE — H&P (Signed)
HISTORY AND PHYSICAL INTERVAL NOTE:  09/23/2021  7:12 AM  Kiara Perry  has presented today for surgery, with the diagnosis of S92.35 - Displaced ftacture of fifth metatarsal, right .  The various methods of treatment have been discussed with the patient.  No guarantees were given.  After consideration of risks, benefits and other options for treatment, the patient has consented to surgery.  I have reviewed the patients' chart and labs.    PROCEDURE: Right 5th metatarsal fracture ORIF  A history and physical examination was performed in my office.  The patient was reexamined.  There have been no changes to this history and physical examination.  Caroline More, DPM

## 2021-09-23 NOTE — Transfer of Care (Signed)
Immediate Anesthesia Transfer of Care Note  Patient: Kiara Perry  Procedure(s) Performed: OPEN REDUCTION INTERNAL FIXATION (ORIF) METATARSAL (TOE) FRACTURE - 5TH (Right: Toe)  Patient Location: PACU  Anesthesia Type: General  Level of Consciousness: awake, alert  and patient cooperative  Airway and Oxygen Therapy: Patient Spontanous Breathing and Patient connected to supplemental oxygen  Post-op Assessment: Post-op Vital signs reviewed, Patient's Cardiovascular Status Stable, Respiratory Function Stable, Patent Airway and No signs of Nausea or vomiting  Post-op Vital Signs: Reviewed and stable  Complications: No notable events documented.

## 2021-09-23 NOTE — Progress Notes (Signed)
Assisted Veda Canning, ANMD with right, ultrasound guided, popliteal/saphenous block. Side rails up, monitors on throughout procedure. See vital signs in flow sheet. Tolerated Procedure well.

## 2021-09-23 NOTE — Anesthesia Procedure Notes (Signed)
Anesthesia Regional Block: Adductor canal block   Pre-Anesthetic Checklist: , timeout performed,  Correct Patient, Correct Site, Correct Laterality,  Correct Procedure, Correct Position, site marked,  Risks and benefits discussed,  Surgical consent,  Pre-op evaluation,  At surgeon's request and post-op pain management  Laterality: Right  Prep: chloraprep       Needles:  Injection technique: Single-shot  Needle Type: Echogenic Needle     Needle Length: 9cm  Needle Gauge: 21     Additional Needles:   Procedures:,,,, ultrasound used (permanent image in chart),,    Narrative:  Start time: 09/23/2021 7:13 AM End time: 09/23/2021 7:15 AM Injection made incrementally with aspirations every 5 mL.  Performed by: Personally  Anesthesiologist: Veda Canning, MD  Additional Notes: Functioning IV was confirmed and monitors applied. Ultrasound guidance: relevant anatomy identified, needle position confirmed, local anesthetic spread visualized around nerve(s)., vascular puncture avoided.  Image printed for medical record.  Negative aspiration and no paresthesias; incremental administration of local anesthetic. The patient tolerated the procedure well. Vitals signes recorded in RN notes.

## 2021-09-23 NOTE — Anesthesia Preprocedure Evaluation (Addendum)
Anesthesia Evaluation  Patient identified by MRN, date of birth, ID band Patient awake    Reviewed: Allergy & Precautions, NPO status   Airway Mallampati: II  TM Distance: >3 FB     Dental   Pulmonary neg pulmonary ROS,    Pulmonary exam normal        Cardiovascular hypertension, + CAD   Rhythm:Regular Rate:Normal     Neuro/Psych    GI/Hepatic GERD  ,  Endo/Other    Renal/GU      Musculoskeletal  (+) Arthritis ,   Abdominal   Peds  Hematology   Anesthesia Other Findings   Reproductive/Obstetrics                            Anesthesia Physical Anesthesia Plan  ASA: 2  Anesthesia Plan: General and Regional   Post-op Pain Management: Regional block   Induction: Intravenous  PONV Risk Score and Plan: 3 and Propofol infusion, TIVA, Treatment may vary due to age or medical condition and Midazolam  Airway Management Planned: Natural Airway and Nasal Cannula  Additional Equipment:   Intra-op Plan:   Post-operative Plan:   Informed Consent: I have reviewed the patients History and Physical, chart, labs and discussed the procedure including the risks, benefits and alternatives for the proposed anesthesia with the patient or authorized representative who has indicated his/her understanding and acceptance.       Plan Discussed with: CRNA  Anesthesia Plan Comments:       Anesthesia Quick Evaluation

## 2021-09-23 NOTE — Op Note (Signed)
PODIATRY / FOOT AND ANKLE SURGERY OPERATIVE REPORT    SURGEON: Caroline More, DPM  PRE-OPERATIVE DIAGNOSIS:  1.  Right fifth metatarsal base fracture, nondisplaced, delayed healing  POST-OPERATIVE DIAGNOSIS: Same  PROCEDURE(S): Right fifth metatarsal base fracture open reduction with internal fixation Bone stimulator application  HEMOSTASIS: Right ankle tourniquet  ANESTHESIA: general  ESTIMATED BLOOD LOSS: 5 cc  FINDING(S): 1.  Minimally displaced fracture to the right fifth metatarsal base  PATHOLOGY/SPECIMEN(S): None  INDICATIONS:   PAYSLEE BATESON is a 86 y.o. female who presents with a nonhealing fracture to the base of the right fifth metatarsal its been treated conservatively for around 8 to 9 weeks with a period of nonweightbearing followed by a period of partial weightbearing.  Patient still continues to have pain and discomfort.  Patient had repeat x-ray imaging around 1 to 2 weeks ago which showed nonhealing to the fracture area with minimal bone callus formation.  Discussed all treatment options with the patient and patient's family and at this time they have elected for surgery consisting of right fifth metatarsal base fracture open reduction with internal fixation, no guarantees given, consent obtained prior to procedure.  DESCRIPTION: After obtaining full informed written consent, the patient was brought back to the operating room and placed supine upon the operating table.  A popliteal nerve block was performed by anesthesia preoperatively.  A ankle tourniquet was applied about the patient's right ankle.  The patient received IV antibiotics prior to induction.  After obtaining adequate anesthesia, the patient was prepped and draped in the standard fashion.  An Esmarch bandage used to exsanguinate the right lower extremity and the pneumatic ankle tourniquet was inflated.  Attention was then directed to the fifth metatarsal base area where under fluoroscopic guidance  the fifth metatarsal base was identified and a guidewire was then placed through the fifth metatarsal base area across the fracture line and into the fifth metatarsal shaft.  This was performed under multiple views with fluoroscopic guidance including AP, oblique, and lateral position.  Once the wire was in good position utilizing standard AO principles and techniques a 4.5 x 46 mm cannulated Paragon 28 screw was then placed across the fracture line with excellent compression noted.  The fracture appeared to be well opposed at this time with no displacement and appear to be well compressed under fluoroscopic guidance.  The screw appeared to fill the medullary canal and also appeared to have excellent apposition to the fifth metatarsal base at the head area of the screw, this was checked under multiple views and angles with fluoroscopy.  The surgical site was flushed with copious amounts normal sterile saline.  The skin incision was then reapproximated well coapted with 3-0 nylon.  The pneumatic ankle tourniquet was deflated and a prompt operative response was noted all digits of the right foot.  A postoperative dressing was applied consisting of Xeroform to the incision line followed by 4 x 4 gauze, Kerlix, web roll, posterior splint, Ace wrap.  Patient tolerated the procedure and anesthesia well was transferred to recovery room vital signs stable vascular status intact all toes the right foot.  Patient will be discharged home with the appropriate orders and medications and instructions.  Patient is to remain nonweightbearing to the right lower extremity at all times for the next 6 weeks.  Bone stimulator was applied to the right foot.  Patient instructed on usage postoperatively.  COMPLICATIONS: None  CONDITION: Good, stable  Caroline More, DPM

## 2021-09-24 ENCOUNTER — Encounter: Payer: Self-pay | Admitting: Podiatry

## 2021-09-28 DIAGNOSIS — S92354D Nondisplaced fracture of fifth metatarsal bone, right foot, subsequent encounter for fracture with routine healing: Secondary | ICD-10-CM | POA: Diagnosis not present

## 2021-10-21 DIAGNOSIS — S92354D Nondisplaced fracture of fifth metatarsal bone, right foot, subsequent encounter for fracture with routine healing: Secondary | ICD-10-CM | POA: Diagnosis not present

## 2021-10-21 DIAGNOSIS — M79671 Pain in right foot: Secondary | ICD-10-CM | POA: Diagnosis not present

## 2021-11-02 DIAGNOSIS — S92354D Nondisplaced fracture of fifth metatarsal bone, right foot, subsequent encounter for fracture with routine healing: Secondary | ICD-10-CM | POA: Diagnosis not present

## 2021-11-05 DIAGNOSIS — M2560 Stiffness of unspecified joint, not elsewhere classified: Secondary | ICD-10-CM | POA: Diagnosis not present

## 2021-11-05 DIAGNOSIS — M79671 Pain in right foot: Secondary | ICD-10-CM | POA: Diagnosis not present

## 2021-11-05 DIAGNOSIS — M6281 Muscle weakness (generalized): Secondary | ICD-10-CM | POA: Diagnosis not present

## 2021-11-12 DIAGNOSIS — M79671 Pain in right foot: Secondary | ICD-10-CM | POA: Diagnosis not present

## 2021-11-15 DIAGNOSIS — M8588 Other specified disorders of bone density and structure, other site: Secondary | ICD-10-CM | POA: Diagnosis not present

## 2021-11-19 DIAGNOSIS — M79671 Pain in right foot: Secondary | ICD-10-CM | POA: Diagnosis not present

## 2021-11-22 DIAGNOSIS — M79671 Pain in right foot: Secondary | ICD-10-CM | POA: Diagnosis not present

## 2021-11-25 DIAGNOSIS — M79671 Pain in right foot: Secondary | ICD-10-CM | POA: Diagnosis not present

## 2021-11-29 DIAGNOSIS — M79671 Pain in right foot: Secondary | ICD-10-CM | POA: Diagnosis not present

## 2021-11-30 DIAGNOSIS — G894 Chronic pain syndrome: Secondary | ICD-10-CM | POA: Diagnosis not present

## 2021-11-30 DIAGNOSIS — M25571 Pain in right ankle and joints of right foot: Secondary | ICD-10-CM | POA: Diagnosis not present

## 2021-11-30 DIAGNOSIS — M2141 Flat foot [pes planus] (acquired), right foot: Secondary | ICD-10-CM | POA: Diagnosis not present

## 2021-11-30 DIAGNOSIS — M79671 Pain in right foot: Secondary | ICD-10-CM | POA: Diagnosis not present

## 2021-11-30 DIAGNOSIS — M25474 Effusion, right foot: Secondary | ICD-10-CM | POA: Diagnosis not present

## 2021-11-30 DIAGNOSIS — R262 Difficulty in walking, not elsewhere classified: Secondary | ICD-10-CM | POA: Diagnosis not present

## 2021-11-30 DIAGNOSIS — Z86711 Personal history of pulmonary embolism: Secondary | ICD-10-CM | POA: Diagnosis not present

## 2021-11-30 DIAGNOSIS — M2142 Flat foot [pes planus] (acquired), left foot: Secondary | ICD-10-CM | POA: Diagnosis not present

## 2021-12-02 DIAGNOSIS — M79671 Pain in right foot: Secondary | ICD-10-CM | POA: Diagnosis not present

## 2021-12-07 DIAGNOSIS — M79671 Pain in right foot: Secondary | ICD-10-CM | POA: Diagnosis not present

## 2021-12-09 DIAGNOSIS — M79671 Pain in right foot: Secondary | ICD-10-CM | POA: Diagnosis not present

## 2021-12-13 DIAGNOSIS — Z0000 Encounter for general adult medical examination without abnormal findings: Secondary | ICD-10-CM | POA: Diagnosis not present

## 2021-12-13 DIAGNOSIS — I10 Essential (primary) hypertension: Secondary | ICD-10-CM | POA: Diagnosis not present

## 2021-12-13 DIAGNOSIS — G894 Chronic pain syndrome: Secondary | ICD-10-CM | POA: Diagnosis not present

## 2021-12-13 DIAGNOSIS — I25118 Atherosclerotic heart disease of native coronary artery with other forms of angina pectoris: Secondary | ICD-10-CM | POA: Diagnosis not present

## 2021-12-13 DIAGNOSIS — N1832 Chronic kidney disease, stage 3b: Secondary | ICD-10-CM | POA: Diagnosis not present

## 2021-12-13 DIAGNOSIS — I700 Atherosclerosis of aorta: Secondary | ICD-10-CM | POA: Diagnosis not present

## 2021-12-13 DIAGNOSIS — E7800 Pure hypercholesterolemia, unspecified: Secondary | ICD-10-CM | POA: Diagnosis not present

## 2021-12-13 DIAGNOSIS — I2510 Atherosclerotic heart disease of native coronary artery without angina pectoris: Secondary | ICD-10-CM | POA: Diagnosis not present

## 2021-12-13 DIAGNOSIS — Z79899 Other long term (current) drug therapy: Secondary | ICD-10-CM | POA: Diagnosis not present

## 2021-12-14 DIAGNOSIS — M79671 Pain in right foot: Secondary | ICD-10-CM | POA: Diagnosis not present

## 2021-12-16 DIAGNOSIS — M79671 Pain in right foot: Secondary | ICD-10-CM | POA: Diagnosis not present

## 2021-12-23 DIAGNOSIS — M79671 Pain in right foot: Secondary | ICD-10-CM | POA: Diagnosis not present

## 2021-12-28 DIAGNOSIS — M25571 Pain in right ankle and joints of right foot: Secondary | ICD-10-CM | POA: Diagnosis not present

## 2021-12-28 DIAGNOSIS — M2141 Flat foot [pes planus] (acquired), right foot: Secondary | ICD-10-CM | POA: Diagnosis not present

## 2021-12-28 DIAGNOSIS — M2142 Flat foot [pes planus] (acquired), left foot: Secondary | ICD-10-CM | POA: Diagnosis not present

## 2021-12-28 DIAGNOSIS — G894 Chronic pain syndrome: Secondary | ICD-10-CM | POA: Diagnosis not present

## 2021-12-28 DIAGNOSIS — Z86711 Personal history of pulmonary embolism: Secondary | ICD-10-CM | POA: Diagnosis not present

## 2021-12-28 DIAGNOSIS — M205X1 Other deformities of toe(s) (acquired), right foot: Secondary | ICD-10-CM | POA: Diagnosis not present

## 2021-12-28 DIAGNOSIS — B351 Tinea unguium: Secondary | ICD-10-CM | POA: Diagnosis not present

## 2021-12-28 DIAGNOSIS — M79674 Pain in right toe(s): Secondary | ICD-10-CM | POA: Diagnosis not present

## 2022-01-13 DIAGNOSIS — M1711 Unilateral primary osteoarthritis, right knee: Secondary | ICD-10-CM | POA: Diagnosis not present

## 2022-01-13 DIAGNOSIS — Z96652 Presence of left artificial knee joint: Secondary | ICD-10-CM | POA: Diagnosis not present

## 2022-01-19 DIAGNOSIS — I10 Essential (primary) hypertension: Secondary | ICD-10-CM | POA: Diagnosis not present

## 2022-01-19 DIAGNOSIS — I2510 Atherosclerotic heart disease of native coronary artery without angina pectoris: Secondary | ICD-10-CM | POA: Diagnosis not present

## 2022-01-19 DIAGNOSIS — R001 Bradycardia, unspecified: Secondary | ICD-10-CM | POA: Diagnosis not present

## 2022-01-19 DIAGNOSIS — N1831 Chronic kidney disease, stage 3a: Secondary | ICD-10-CM | POA: Diagnosis not present

## 2022-01-19 DIAGNOSIS — E7800 Pure hypercholesterolemia, unspecified: Secondary | ICD-10-CM | POA: Diagnosis not present

## 2022-01-19 DIAGNOSIS — I700 Atherosclerosis of aorta: Secondary | ICD-10-CM | POA: Diagnosis not present

## 2022-03-15 DIAGNOSIS — E7800 Pure hypercholesterolemia, unspecified: Secondary | ICD-10-CM | POA: Diagnosis not present

## 2022-03-15 DIAGNOSIS — Z79899 Other long term (current) drug therapy: Secondary | ICD-10-CM | POA: Diagnosis not present

## 2022-03-15 DIAGNOSIS — G8929 Other chronic pain: Secondary | ICD-10-CM | POA: Diagnosis not present

## 2022-03-15 DIAGNOSIS — G894 Chronic pain syndrome: Secondary | ICD-10-CM | POA: Diagnosis not present

## 2022-03-15 DIAGNOSIS — N1831 Chronic kidney disease, stage 3a: Secondary | ICD-10-CM | POA: Diagnosis not present

## 2022-03-15 DIAGNOSIS — M5442 Lumbago with sciatica, left side: Secondary | ICD-10-CM | POA: Diagnosis not present

## 2022-03-15 DIAGNOSIS — I10 Essential (primary) hypertension: Secondary | ICD-10-CM | POA: Diagnosis not present

## 2022-03-15 DIAGNOSIS — I2510 Atherosclerotic heart disease of native coronary artery without angina pectoris: Secondary | ICD-10-CM | POA: Diagnosis not present

## 2022-04-19 DIAGNOSIS — R7989 Other specified abnormal findings of blood chemistry: Secondary | ICD-10-CM | POA: Diagnosis not present

## 2022-04-19 DIAGNOSIS — E871 Hypo-osmolality and hyponatremia: Secondary | ICD-10-CM | POA: Diagnosis not present

## 2022-04-19 DIAGNOSIS — E875 Hyperkalemia: Secondary | ICD-10-CM | POA: Diagnosis not present

## 2022-04-27 DIAGNOSIS — N1831 Chronic kidney disease, stage 3a: Secondary | ICD-10-CM | POA: Diagnosis not present

## 2022-04-27 DIAGNOSIS — I10 Essential (primary) hypertension: Secondary | ICD-10-CM | POA: Diagnosis not present

## 2022-04-27 DIAGNOSIS — L237 Allergic contact dermatitis due to plants, except food: Secondary | ICD-10-CM | POA: Diagnosis not present

## 2022-05-17 DIAGNOSIS — M2011 Hallux valgus (acquired), right foot: Secondary | ICD-10-CM | POA: Diagnosis not present

## 2022-05-17 DIAGNOSIS — M2141 Flat foot [pes planus] (acquired), right foot: Secondary | ICD-10-CM | POA: Diagnosis not present

## 2022-05-17 DIAGNOSIS — M2041 Other hammer toe(s) (acquired), right foot: Secondary | ICD-10-CM | POA: Diagnosis not present

## 2022-05-17 DIAGNOSIS — B351 Tinea unguium: Secondary | ICD-10-CM | POA: Diagnosis not present

## 2022-05-17 DIAGNOSIS — M79671 Pain in right foot: Secondary | ICD-10-CM | POA: Diagnosis not present

## 2022-05-17 DIAGNOSIS — Z86711 Personal history of pulmonary embolism: Secondary | ICD-10-CM | POA: Diagnosis not present

## 2022-05-17 DIAGNOSIS — M2142 Flat foot [pes planus] (acquired), left foot: Secondary | ICD-10-CM | POA: Diagnosis not present

## 2022-06-16 DIAGNOSIS — Z79899 Other long term (current) drug therapy: Secondary | ICD-10-CM | POA: Diagnosis not present

## 2022-06-16 DIAGNOSIS — I10 Essential (primary) hypertension: Secondary | ICD-10-CM | POA: Diagnosis not present

## 2022-06-16 DIAGNOSIS — I2510 Atherosclerotic heart disease of native coronary artery without angina pectoris: Secondary | ICD-10-CM | POA: Diagnosis not present

## 2022-06-16 DIAGNOSIS — E7800 Pure hypercholesterolemia, unspecified: Secondary | ICD-10-CM | POA: Diagnosis not present

## 2022-06-16 DIAGNOSIS — N1831 Chronic kidney disease, stage 3a: Secondary | ICD-10-CM | POA: Diagnosis not present

## 2022-06-16 DIAGNOSIS — G894 Chronic pain syndrome: Secondary | ICD-10-CM | POA: Diagnosis not present

## 2022-06-16 DIAGNOSIS — Z1231 Encounter for screening mammogram for malignant neoplasm of breast: Secondary | ICD-10-CM | POA: Diagnosis not present

## 2022-06-16 DIAGNOSIS — R7309 Other abnormal glucose: Secondary | ICD-10-CM | POA: Diagnosis not present

## 2022-06-17 ENCOUNTER — Other Ambulatory Visit: Payer: Self-pay | Admitting: Internal Medicine

## 2022-06-17 DIAGNOSIS — Z1231 Encounter for screening mammogram for malignant neoplasm of breast: Secondary | ICD-10-CM

## 2022-07-20 DIAGNOSIS — R0602 Shortness of breath: Secondary | ICD-10-CM | POA: Diagnosis not present

## 2022-07-20 DIAGNOSIS — I2584 Coronary atherosclerosis due to calcified coronary lesion: Secondary | ICD-10-CM | POA: Diagnosis not present

## 2022-07-20 DIAGNOSIS — E7800 Pure hypercholesterolemia, unspecified: Secondary | ICD-10-CM | POA: Diagnosis not present

## 2022-07-20 DIAGNOSIS — I2510 Atherosclerotic heart disease of native coronary artery without angina pectoris: Secondary | ICD-10-CM | POA: Diagnosis not present

## 2022-07-20 DIAGNOSIS — R001 Bradycardia, unspecified: Secondary | ICD-10-CM | POA: Diagnosis not present

## 2022-07-20 DIAGNOSIS — I10 Essential (primary) hypertension: Secondary | ICD-10-CM | POA: Diagnosis not present

## 2022-08-10 DIAGNOSIS — R399 Unspecified symptoms and signs involving the genitourinary system: Secondary | ICD-10-CM | POA: Diagnosis not present

## 2022-08-12 ENCOUNTER — Emergency Department: Payer: PPO

## 2022-08-12 ENCOUNTER — Other Ambulatory Visit: Payer: Self-pay

## 2022-08-12 ENCOUNTER — Observation Stay
Admission: EM | Admit: 2022-08-12 | Discharge: 2022-08-14 | Disposition: A | Discharge status: Home / Self Care | Payer: PPO | Attending: Internal Medicine | Admitting: Internal Medicine

## 2022-08-12 ENCOUNTER — Observation Stay: Payer: PPO

## 2022-08-12 DIAGNOSIS — Z8616 Personal history of COVID-19: Secondary | ICD-10-CM | POA: Diagnosis not present

## 2022-08-12 DIAGNOSIS — I959 Hypotension, unspecified: Secondary | ICD-10-CM | POA: Diagnosis not present

## 2022-08-12 DIAGNOSIS — I2510 Atherosclerotic heart disease of native coronary artery without angina pectoris: Secondary | ICD-10-CM | POA: Diagnosis not present

## 2022-08-12 DIAGNOSIS — Z86718 Personal history of other venous thrombosis and embolism: Secondary | ICD-10-CM | POA: Insufficient documentation

## 2022-08-12 DIAGNOSIS — M19012 Primary osteoarthritis, left shoulder: Secondary | ICD-10-CM | POA: Diagnosis not present

## 2022-08-12 DIAGNOSIS — I10 Essential (primary) hypertension: Secondary | ICD-10-CM | POA: Insufficient documentation

## 2022-08-12 DIAGNOSIS — R109 Unspecified abdominal pain: Secondary | ICD-10-CM | POA: Insufficient documentation

## 2022-08-12 DIAGNOSIS — R001 Bradycardia, unspecified: Secondary | ICD-10-CM | POA: Diagnosis not present

## 2022-08-12 DIAGNOSIS — R509 Fever, unspecified: Secondary | ICD-10-CM | POA: Insufficient documentation

## 2022-08-12 DIAGNOSIS — E871 Hypo-osmolality and hyponatremia: Secondary | ICD-10-CM | POA: Diagnosis not present

## 2022-08-12 DIAGNOSIS — Z96652 Presence of left artificial knee joint: Secondary | ICD-10-CM | POA: Insufficient documentation

## 2022-08-12 DIAGNOSIS — Z1152 Encounter for screening for COVID-19: Secondary | ICD-10-CM | POA: Diagnosis not present

## 2022-08-12 DIAGNOSIS — R7401 Elevation of levels of liver transaminase levels: Secondary | ICD-10-CM | POA: Diagnosis not present

## 2022-08-12 DIAGNOSIS — Z79899 Other long term (current) drug therapy: Secondary | ICD-10-CM | POA: Diagnosis not present

## 2022-08-12 DIAGNOSIS — E785 Hyperlipidemia, unspecified: Secondary | ICD-10-CM | POA: Diagnosis present

## 2022-08-12 DIAGNOSIS — A419 Sepsis, unspecified organism: Secondary | ICD-10-CM | POA: Diagnosis not present

## 2022-08-12 DIAGNOSIS — N390 Urinary tract infection, site not specified: Secondary | ICD-10-CM | POA: Insufficient documentation

## 2022-08-12 DIAGNOSIS — R55 Syncope and collapse: Secondary | ICD-10-CM | POA: Diagnosis not present

## 2022-08-12 DIAGNOSIS — K449 Diaphragmatic hernia without obstruction or gangrene: Secondary | ICD-10-CM | POA: Diagnosis not present

## 2022-08-12 DIAGNOSIS — K5730 Diverticulosis of large intestine without perforation or abscess without bleeding: Secondary | ICD-10-CM | POA: Diagnosis not present

## 2022-08-12 DIAGNOSIS — M19011 Primary osteoarthritis, right shoulder: Secondary | ICD-10-CM | POA: Diagnosis not present

## 2022-08-12 DIAGNOSIS — R531 Weakness: Secondary | ICD-10-CM | POA: Diagnosis not present

## 2022-08-12 DIAGNOSIS — K219 Gastro-esophageal reflux disease without esophagitis: Secondary | ICD-10-CM | POA: Diagnosis present

## 2022-08-12 LAB — COMPREHENSIVE METABOLIC PANEL
ALT: 26 U/L (ref 0–44)
AST: 36 U/L (ref 15–41)
Albumin: 3.6 g/dL (ref 3.5–5.0)
Alkaline Phosphatase: 90 U/L (ref 38–126)
Anion gap: 9 (ref 5–15)
BUN: 19 mg/dL (ref 8–23)
CO2: 23 mmol/L (ref 22–32)
Calcium: 9.3 mg/dL (ref 8.9–10.3)
Chloride: 102 mmol/L (ref 98–111)
Creatinine, Ser: 1.08 mg/dL — ABNORMAL HIGH (ref 0.44–1.00)
GFR, Estimated: 51 mL/min — ABNORMAL LOW (ref >60)
Glucose, Bld: 134 mg/dL — ABNORMAL HIGH (ref 70–99)
Potassium: 3.7 mmol/L (ref 3.5–5.1)
Sodium: 134 mmol/L — ABNORMAL LOW (ref 135–145)
Total Bilirubin: 1.3 mg/dL — ABNORMAL HIGH (ref 0.3–1.2)
Total Protein: 6.7 g/dL (ref 6.5–8.1)

## 2022-08-12 LAB — URINALYSIS, COMPLETE (UACMP) WITH MICROSCOPIC
Bilirubin Urine: NEGATIVE
Glucose, UA: NEGATIVE mg/dL
Ketones, ur: NEGATIVE mg/dL
Nitrite: NEGATIVE
Protein, ur: NEGATIVE mg/dL
Specific Gravity, Urine: 1.014 (ref 1.005–1.030)
WBC, UA: >50 WBC/hpf — ABNORMAL HIGH (ref 0–5)
pH: 6 (ref 5.0–8.0)

## 2022-08-12 LAB — CBG MONITORING, ED: Glucose-Capillary: 138 mg/dL — ABNORMAL HIGH (ref 70–99)

## 2022-08-12 LAB — CBC WITH DIFFERENTIAL/PLATELET
Abs Immature Granulocytes: 0.06 10*3/uL (ref 0.00–0.07)
Basophils Absolute: 0 10*3/uL (ref 0.0–0.1)
Basophils Relative: 0 %
Eosinophils Absolute: 0 10*3/uL (ref 0.0–0.5)
Eosinophils Relative: 0 %
HCT: 37.7 % (ref 36.0–46.0)
Hemoglobin: 12.3 g/dL (ref 12.0–15.0)
Immature Granulocytes: 1 %
Lymphocytes Relative: 8 %
Lymphs Abs: 0.9 10*3/uL (ref 0.7–4.0)
MCH: 29.5 pg (ref 26.0–34.0)
MCHC: 32.6 g/dL (ref 30.0–36.0)
MCV: 90.4 fL (ref 80.0–100.0)
Monocytes Absolute: 0.6 10*3/uL (ref 0.1–1.0)
Monocytes Relative: 6 %
Neutro Abs: 9.3 10*3/uL — ABNORMAL HIGH (ref 1.7–7.7)
Neutrophils Relative %: 85 %
Platelets: 230 10*3/uL (ref 150–400)
RBC: 4.17 MIL/uL (ref 3.87–5.11)
RDW: 13.9 % (ref 11.5–15.5)
WBC: 11 10*3/uL — ABNORMAL HIGH (ref 4.0–10.5)
nRBC: 0 % (ref 0.0–0.2)

## 2022-08-12 LAB — TROPONIN I (HIGH SENSITIVITY)
Troponin I (High Sensitivity): 11 ng/L (ref <18)
Troponin I (High Sensitivity): 10 ng/L (ref <18)

## 2022-08-12 LAB — RESP PANEL BY RT-PCR (FLU A&B, COVID) ARPGX2
Influenza A by PCR: NEGATIVE
Influenza B by PCR: NEGATIVE
SARS Coronavirus 2 by RT PCR: NEGATIVE

## 2022-08-12 LAB — LACTIC ACID, PLASMA
Lactic Acid, Venous: 1.5 mmol/L (ref 0.5–1.9)
Lactic Acid, Venous: 1.4 mmol/L (ref 0.5–1.9)

## 2022-08-12 MED ORDER — CELECOXIB 200 MG PO CAPS
200.0000 mg | ORAL_CAPSULE | Freq: Every day | ORAL | Status: DC
  Administered 2022-08-13 – 2022-08-14 (×2): 200 mg via ORAL
  Filled 2022-08-12 (×3): qty 1

## 2022-08-12 MED ORDER — PANTOPRAZOLE SODIUM 40 MG PO TBEC
40.0000 mg | DELAYED_RELEASE_TABLET | Freq: Every day | ORAL | Status: DC
  Administered 2022-08-12 – 2022-08-14 (×3): 40 mg via ORAL
  Filled 2022-08-12 (×3): qty 1

## 2022-08-12 MED ORDER — SENNOSIDES-DOCUSATE SODIUM 8.6-50 MG PO TABS: 1.0000 | ORAL_TABLET | Freq: Every evening | ORAL | Status: DC | PRN

## 2022-08-12 MED ORDER — ACETAMINOPHEN 325 MG PO TABS: 650.0000 mg | ORAL_TABLET | Freq: Four times a day (QID) | ORAL | Status: DC | PRN

## 2022-08-12 MED ORDER — SODIUM CHLORIDE 0.9 % IV SOLN
2.0000 g | INTRAVENOUS | Status: DC
  Administered 2022-08-12 – 2022-08-14 (×3): 2 g via INTRAVENOUS
  Filled 2022-08-12 (×2): qty 2
  Filled 2022-08-12: qty 20

## 2022-08-12 MED ORDER — LACTATED RINGERS IV SOLN: INTRAVENOUS | Status: DC

## 2022-08-12 MED ORDER — ENOXAPARIN SODIUM 40 MG/0.4ML IJ SOSY
40.0000 mg | PREFILLED_SYRINGE | INTRAMUSCULAR | Status: DC
  Administered 2022-08-12 – 2022-08-13 (×2): 40 mg via SUBCUTANEOUS
  Filled 2022-08-12 (×4): qty 0.4

## 2022-08-12 MED ORDER — ONDANSETRON HCL 4 MG/2ML IJ SOLN: 4.0000 mg | Freq: Four times a day (QID) | INTRAMUSCULAR | Status: DC | PRN

## 2022-08-12 MED ORDER — ACETAMINOPHEN 650 MG RE SUPP: 650.0000 mg | Freq: Four times a day (QID) | RECTAL | Status: DC | PRN

## 2022-08-12 MED ORDER — SODIUM CHLORIDE 0.9 % IV BOLUS
500.0000 mL | Freq: Once | INTRAVENOUS | Status: AC
  Administered 2022-08-12: 500 mL via INTRAVENOUS

## 2022-08-12 MED ORDER — PRAVASTATIN SODIUM 20 MG PO TABS
80.0000 mg | ORAL_TABLET | Freq: Every day | ORAL | Status: DC
  Administered 2022-08-13: 80 mg via ORAL
  Filled 2022-08-12: qty 4

## 2022-08-12 MED ORDER — GABAPENTIN 100 MG PO CAPS
200.0000 mg | ORAL_CAPSULE | Freq: Every day | ORAL | Status: DC
  Administered 2022-08-12 – 2022-08-13 (×2): 200 mg via ORAL
  Filled 2022-08-12 (×2): qty 2

## 2022-08-12 MED ORDER — LACTATED RINGERS IV BOLUS (SEPSIS)
1000.0000 mL | Freq: Once | INTRAVENOUS | Status: AC
  Administered 2022-08-12: 1000 mL via INTRAVENOUS

## 2022-08-12 MED ORDER — ONDANSETRON HCL 4 MG PO TABS: 4.0000 mg | ORAL_TABLET | Freq: Four times a day (QID) | ORAL | Status: DC | PRN

## 2022-08-12 MED ORDER — TRAMADOL HCL 50 MG PO TABS
50.0000 mg | ORAL_TABLET | Freq: Four times a day (QID) | ORAL | Status: DC | PRN
  Administered 2022-08-12: 50 mg via ORAL
  Filled 2022-08-12: qty 1

## 2022-08-12 NOTE — ED Notes (Signed)
Request made for transport to the floor ?

## 2022-08-12 NOTE — ED Provider Notes (Signed)
[facility] Provider Note    Event Date/Time   First MD Initiated Contact with Patient 08/12/22 [phone]     (approximate)   History   Weakness   HPI  Kiara Perry is a 87 y.o. female with history of hypertension, hyperlipidemia, ED, PE, osteoarthritis, osteoporosis, GERD, who presents with generalized weakness and concern for possible UTI.  The patient and family state that she was started on antibiotics for UTI yesterday and took 2 doses, however today she was weak and confused.  The patient states she had some abdominal pain earlier but is not having any pain currently.  She just feels weak and has had fever and chills.  I reviewed the past medical records.  The patient was most recently admitted in March 2022 for knee arthroplasty.  Per note from Dr. Doy Hutching the patient was started on Vale yesterday.   Physical Exam   Triage Vital Signs: ED Triage Vitals  Enc Vitals Group     BP 08/12/22 0855 (!) 121/51     Pulse Rate 08/12/22 0855 60     Resp 08/12/22 0855 (!) 22     Temp 08/12/22 0855 97.9 F (36.6 C)     Temp src --      SpO2 08/12/22 0855 99 %     Weight --      Height --      Head Circumference --      Peak Flow --      Pain Score 08/12/22 0901 0     Pain Loc --      Pain Edu? --      Excl. in [place]? --     Most recent vital signs: Vitals:   08/12/22 1452 08/12/22 1530  BP:  (!) 103/51  Pulse:  (!) 57  Resp:  14  Temp: 98.2 F (36.8 C)   SpO2:  100%     General: Alert and oriented, weak and uncomfortable appearing. CV:  Good peripheral perfusion.  Resp:  Normal effort.  Abd:  Soft and nontender.  No distention.  Other:  Dry mucous membranes.  No significant peripheral edema.  Motor intact in all extremities.   ED Results / Procedures / Treatments   Labs (all labs ordered are listed, but only abnormal results are displayed) Labs Reviewed  COMPREHENSIVE METABOLIC PANEL - Abnormal; Notable for the following  components:      Result Value   Sodium 134 (*)    Glucose, Bld 134 (*)    Creatinine, Ser 1.08 (*)    Total Bilirubin 1.3 (*)    GFR, Estimated 51 (*)    All other components within normal limits  CBC WITH DIFFERENTIAL/PLATELET - Abnormal; Notable for the following components:   WBC 11.0 (*)    Neutro Abs 9.3 (*)    All other components within normal limits  URINALYSIS, COMPLETE (UACMP) WITH MICROSCOPIC - Abnormal; Notable for the following components:   Color, Urine YELLOW (*)    APPearance CLEAR (*)    Hgb urine dipstick SMALL (*)    Leukocytes,Ua SMALL (*)    WBC, UA >50 (*)    Bacteria, UA RARE (*)    All other components within normal limits  CBG MONITORING, ED - Abnormal; Notable for the following components:   Glucose-Capillary 138 (*)    All other components within normal limits  RESP PANEL BY RT-PCR (FLU A&B, COVID) ARPGX2  CULTURE, BLOOD (ROUTINE X 2)  CULTURE, BLOOD (ROUTINE X 2)  URINE CULTURE  LACTIC ACID, PLASMA  LACTIC ACID, PLASMA  TROPONIN I (HIGH SENSITIVITY)  TROPONIN I (HIGH SENSITIVITY)     EKG  ED ECG REPORT I, Arta Silence, the attending physician, personally viewed and interpreted this ECG.  Date: 08/12/2022 EKG Time: 0911 Rate: 57 Rhythm: normal sinus rhythm QRS Axis: normal Intervals: normal ST/T Wave abnormalities: normal Narrative Interpretation: no evidence of acute ischemia    RADIOLOGY  Chest x-ray: I independently viewed and interpreted the images; there is no focal consolidation or edema  PROCEDURES:  Critical Care performed: No  Procedures   MEDICATIONS ORDERED IN ED: Medications  cefTRIAXone (ROCEPHIN) 2 g in sodium chloride 0.9 % 100 mL IVPB (0 g Intravenous Stopped 08/12/22 0957)  lactated ringers infusion ( Intravenous New Bag/Given 08/12/22 1134)  lactated ringers bolus 1,000 mL (0 mLs Intravenous Stopped 08/12/22 1105)  sodium chloride 0.9 % bolus 500 mL (500 mLs Intravenous New Bag/Given 08/12/22 1345)      IMPRESSION / MDM / ASSESSMENT AND PLAN / ED COURSE  I reviewed the triage vital signs and the nursing notes.  30-year-old female with PMH as noted above presents with generalized weakness and fever and chills after being diagnosed with a UTI and started Macrobid yesterday.  On exam her vital signs are normal and she is alert and oriented but uncomfortable and weak appearing.  She apparently was confused earlier.  Neurologic exam is nonfocal.  Differential diagnosis includes, but is not limited to, UTI, sepsis, COVID-19, pneumonia, other infection, dehydration, AKI, other metabolic disturbance.  I have a low suspicion for cardiac or CNS cause.  We will give fluids, obtain lab work-up, give empiric ceftriaxone for likely UTI, and reassess.  Patient's presentation is most consistent with acute presentation with potential threat to life or bodily function.  The patient is on the cardiac monitor to evaluate for evidence of arrhythmia and/or significant heart rate changes.  ----------------------------------------- 11:12 AM on 08/12/2022 -----------------------------------------   Urinalysis is still pending.  Lab work-up is reassuring with no leukocytosis and a normal lactate.  I have ordered empiric ceftriaxone.  I consulted Dr. Tyrell Antonio from the hospitalist service; based on her discussion she agrees to admit the patient.   FINAL CLINICAL IMPRESSION(S) / ED DIAGNOSES   Final diagnoses:  Generalized weakness  Urinary tract infection without hematuria, site unspecified     Rx / DC Orders   ED Discharge Orders     None        Note:  This document was prepared using Dragon voice recognition software and may include unintentional dictation errors.    Arta Silence, MD 08/12/22 [phone]

## 2022-08-12 NOTE — ED Triage Notes (Signed)
Syncopal episode in waiting room, brought in by EMS for weakness the past few days. Pt is being treated for a UTI currently.

## 2022-08-12 NOTE — ED Notes (Signed)
First Nurse Note: Pt to ED via [facility] from home for UTI. Pt diagnosed with UTI on Monday and started antibiotics on Tuesday. Pt has had increased fever and weakness. Pt VSS with EMS. Unable to get temp on pt.

## 2022-08-12 NOTE — H&P (Signed)
History and Physical  Kiara Perry PJK:[id number] DOB: 05-Mar-1935 DOA: 08/12/2022  PCP: Idelle Crouch, MD Patient coming from: Home   I have personally briefly reviewed patient's old medical records in [place]   Chief Complaint: pass out, weakness, abdominal pain.   HPI: Kiara Perry is a 87 y.o. female past medical history significant for bilateral hearing loss, chronic headaches, GERD, prior history of PE, hypertension, hyperlipidemia who presents complaining of dysuria, stomach pain, back pain symptoms started 4 days prior to admission.  She was seen by her PCP and was started on antibiotics the day prior to admission for UTI.  She took 2 doses of antibiotics.  She presents with dysuria, weakness, In the triage area she had a  syncope episode.   Per son her eyes rolled back, she went out momentarily.  Patient also reports chills, generalized body ache, and diaphoresis. She also report headaches.  Per son patient was noted to be confused and incoherent at home.  Since is being in the ED she has improved after she has received the IV fluids and antibiotics.  Evaluation in the ED: Pressure 106/71, pulse 55, temperature 97, oxygen saturation 100%, white blood cell 11, lactic acid 1.4, troponin 10, creatinine 1.0, sodium 134.  She received 1 L of IV fluids and started on IV ceftriaxone. UA as an outpatient done on 10/18: With more than 112 white blood cell count in urine.   Review of Systems: All systems reviewed and apart from history of presenting illness, are negative.  Past Medical History:  Diagnosis Date   Anginal pain ([place])    Aortic atherosclerosis ([place])    Arthritis    Coronary artery disease    COVID-19 09/2019   GERD (gastroesophageal reflux disease)    Heart murmur    History of pulmonary embolus (PE)    Hyperlipidemia    Hypertension    Pneumonia    Wears dentures    full upper   Wears hearing aid in both ears    Past Surgical History:   Procedure Laterality Date   ABDOMINAL HYSTERECTOMY     APPENDECTOMY     BACK SURGERY     BREAST BIOPSY Left 1973   neg   BREAST CYST ASPIRATION Left 1970   neg   COLONOSCOPY     ESOPHAGOGASTRODUODENOSCOPY     EYE SURGERY     cateract   IVC FILTER INSERTION N/A 10/13/2020   Procedure: IVC FILTER INSERTION;  Surgeon: Katha Cabal, MD;  Location: [facility];  Service: Cardiovascular;  Laterality: N/A;   IVC FILTER REMOVAL N/A 03/30/2021   Procedure: IVC FILTER REMOVAL;  Surgeon: Katha Cabal, MD;  Location: [facility];  Service: Cardiovascular;  Laterality: N/A;   KNEE ARTHROPLASTY Left 01/11/2021   Procedure: COMPUTER ASSISTED TOTAL KNEE ARTHROPLASTY - RNFA;  Surgeon: Dereck Leep, MD;  Location: [facility];  Service: Orthopedics;  Laterality: Left;   MENISCUS REPAIR     right knee   ORIF TOE FRACTURE Right 09/23/2021   Procedure: OPEN REDUCTION INTERNAL FIXATION (ORIF) METATARSAL (TOE) FRACTURE - 5TH;  Surgeon: Caroline More, DPM;  Location: [facility];  Service: Podiatry;  Laterality: Right;  nesthesia: Choice - pre-op pop saph/block   TONSILLECTOMY     Social History:  reports that she has never smoked. She has never used smokeless tobacco. She reports that she does not drink alcohol and does not use drugs.   Allergies  Allergen Reactions   Levofloxacin Nausea Only    Family History  Problem Relation Age of Onset   Breast cancer Sister 19    Prior to Admission medications   Medication Sig Start Date End Date Taking? Authorizing Provider  bisoprolol (ZEBETA) 5 MG tablet Take 5 mg by mouth in the morning. 02/07/19  Yes [provider]  Calcium Carbonate-Vitamin D 600-400 MG-UNIT tablet Take 1 tablet by mouth in the morning and at bedtime.   Yes [provider]  celecoxib (CELEBREX) 200 MG capsule Take 200 mg by mouth daily. 05/27/22  Yes [provider]  gabapentin (NEURONTIN) 100 MG capsule Take 200 mg by mouth  at bedtime. 03/13/19  Yes [provider]  lovastatin (MEVACOR) 40 MG tablet Take 80 mg by mouth at bedtime. 01/06/19  Yes [provider]  nitrofurantoin, macrocrystal-monohydrate, (MACROBID) 100 MG capsule Take 100 mg by mouth in the morning and at bedtime. For 7 days   Yes [provider]  omeprazole (PRILOSEC) 40 MG capsule Take 40 mg by mouth in the morning. 02/16/19  Yes [provider]  telmisartan (MICARDIS) 40 MG tablet Take 80 mg by mouth in the morning. 09/22/20  Yes [provider]  torsemide (DEMADEX) 10 MG tablet Take 10 mg by mouth in the morning. 03/08/19  Yes [provider]  acetaminophen (TYLENOL) 500 MG tablet Take 500-1,000 mg by mouth every 6 (six) hours as needed (for pain).    [provider]  ondansetron (ZOFRAN) 4 MG tablet Take 1 tablet (4 mg total) by mouth every 8 (eight) hours as needed for nausea or vomiting. 09/23/21   Caroline More, DPM  traMADol (ULTRAM) 50 MG tablet Take 50 mg by mouth every 6 (six) hours as needed for moderate pain. for pain 06/10/19   [provider]   Physical Exam: Vitals:   08/12/22 0915 08/12/22 1030 08/12/22 1045 08/12/22 1230  BP:  106/71  (!) 111/48  Pulse: (!) 57 (!) 58 60 (!) 55  Resp: '17 18 20 19  '$ Temp:      SpO2: 99% 98% 100% 100%  Weight:      Height:        General exam: Moderately built and nourished patient, lying comfortably supine on the gurney in no obvious distress. Head, eyes and ENT: Nontraumatic and normocephalic. Pupils equally reacting to light and accommodation. Oral mucosa moist. Neck: Supple. No JVD, carotid bruit or thyromegaly. Lymphatics: No lymphadenopathy. Respiratory system: Clear to auscultation. No increased work of breathing. Cardiovascular system: S1 and S2 heard, RRR. No JVD, murmurs, gallops, clicks or pedal edema. Gastrointestinal system: Abdomen is nondistended, soft and nontender. Normal bowel sounds heard. No organomegaly or  masses appreciated. Central nervous system: Alert and oriented. No focal neurological deficits. Extremities: Symmetric 5 x 5 power. Peripheral pulses symmetrically felt.  Skin: No rashes or acute findings. Musculoskeletal system: Negative exam. Psychiatry: Pleasant and cooperative.   Labs on Admission:  Basic Metabolic Panel: Recent Labs  Lab 08/12/22 0908  NA 134*  K 3.7  CL 102  CO2 23  GLUCOSE 134*  BUN 19  CREATININE 1.08*  CALCIUM 9.3   Liver Function Tests: Recent Labs  Lab 08/12/22 0908  AST 36  ALT 26  ALKPHOS 90  BILITOT 1.3*  PROT 6.7  ALBUMIN 3.6   No results for input(s): "LIPASE", "AMYLASE" in the last 168 hours. No results for input(s): "AMMONIA" in the last 168 hours. CBC: Recent Labs  Lab 08/12/22 0908  WBC  11.0*  NEUTROABS 9.3*  HGB 12.3  HCT 37.7  MCV 90.4  PLT 230   Cardiac Enzymes: No results for input(s): "CKTOTAL", "CKMB", "CKMBINDEX", "TROPONINI" in the last 168 hours.  BNP (last 3 results) No results for input(s): "PROBNP" in the last 8760 hours. CBG: Recent Labs  Lab 08/12/22 0903  GLUCAP 138*    Radiological Exams on Admission: DG Chest Port 1 View  Result Date: 08/12/2022 CLINICAL DATA:  Questionable sepsis.  Diagnosed with UTI on Monday. EXAM: PORTABLE CHEST 1 VIEW COMPARISON:  CXR 10/10/10 FINDINGS: No pleural effusion. No pneumothorax. Unchanged cardiac and mediastinal contours. No focal airspace opacity. No displaced rib fractures. Degenerative changes of the bilateral AC and glenohumeral joints. Visualized upper abdomen is unremarkable. IMPRESSION: No acute cardiopulmonary abnormality. Electronically Signed   By: Marin Roberts M.D.   On: 08/12/2022 09:36    EKG: Independently reviewed. Sinus Tachycardia.   Assessment/Plan Principal Problem:   Syncope Active Problems:   GERD (gastroesophageal reflux disease)   Hyperlipidemia   Bradycardia   Coronary artery disease involving native coronary artery of native heart    Hypertension   UTI (urinary tract infection)  1-Syncope: Suspect in the setting of dehydration and hypotension in the setting of infection. Troponins negative. Continue with IV fluids Monitor on telemetry Will hold bisoprolol due to bradycardia.   2-UTI: Outpatient UA showed more than 112 white blood cell.  We will repeat urinalysis, check urine culture, blood cultures. Continue with IV ceftriaxone and IV fluids Report back pain and abdominal pain we will proceed with CT renal stone protocol  3-Bradycardia: We will hold bisoprolol.  4-GERD; Continue with PPI.    DVT Prophylaxis: Lovenox Code Status: Patient wishes to be DNR Family Communication: Son at bedside.  Disposition Plan: admit for observation, IV fluids, antibiotics.   Time spent: 75 minutes.   Elmarie Shiley MD [facility]   08/12/2022, 1:48 PM

## 2022-08-13 ENCOUNTER — Observation Stay: Payer: PPO

## 2022-08-13 ENCOUNTER — Encounter: Payer: Self-pay | Admitting: Internal Medicine

## 2022-08-13 DIAGNOSIS — R600 Localized edema: Secondary | ICD-10-CM | POA: Diagnosis not present

## 2022-08-13 DIAGNOSIS — R55 Syncope and collapse: Secondary | ICD-10-CM | POA: Diagnosis not present

## 2022-08-13 LAB — CBC
HCT: 32 % — ABNORMAL LOW (ref 36.0–46.0)
Hemoglobin: 10.7 g/dL — ABNORMAL LOW (ref 12.0–15.0)
MCH: 30.4 pg (ref 26.0–34.0)
MCHC: 33.4 g/dL (ref 30.0–36.0)
MCV: 90.9 fL (ref 80.0–100.0)
Platelets: 177 10*3/uL (ref 150–400)
RBC: 3.52 MIL/uL — ABNORMAL LOW (ref 3.87–5.11)
RDW: 14.1 % (ref 11.5–15.5)
WBC: 3.4 10*3/uL — ABNORMAL LOW (ref 4.0–10.5)
nRBC: 0 % (ref 0.0–0.2)

## 2022-08-13 LAB — COMPREHENSIVE METABOLIC PANEL
ALT: 72 U/L — ABNORMAL HIGH (ref 0–44)
AST: 85 U/L — ABNORMAL HIGH (ref 15–41)
Albumin: 2.8 g/dL — ABNORMAL LOW (ref 3.5–5.0)
Alkaline Phosphatase: 94 U/L (ref 38–126)
Anion gap: 4 — ABNORMAL LOW (ref 5–15)
BUN: 13 mg/dL (ref 8–23)
CO2: 25 mmol/L (ref 22–32)
Calcium: 8.5 mg/dL — ABNORMAL LOW (ref 8.9–10.3)
Chloride: 104 mmol/L (ref 98–111)
Creatinine, Ser: 0.9 mg/dL (ref 0.44–1.00)
GFR, Estimated: >60 mL/min (ref >60)
Glucose, Bld: 87 mg/dL (ref 70–99)
Potassium: 3.8 mmol/L (ref 3.5–5.1)
Sodium: 133 mmol/L — ABNORMAL LOW (ref 135–145)
Total Bilirubin: 0.6 mg/dL (ref 0.3–1.2)
Total Protein: 5.3 g/dL — ABNORMAL LOW (ref 6.5–8.1)

## 2022-08-13 LAB — D-DIMER, QUANTITATIVE: D-Dimer, Quant: 3.58 ug/mL-FEU — ABNORMAL HIGH (ref 0.00–0.50)

## 2022-08-13 MED ORDER — IOHEXOL 350 MG/ML SOLN
75.0000 mL | Freq: Once | INTRAVENOUS | Status: AC | PRN
  Administered 2022-08-13: 75 mL via INTRAVENOUS

## 2022-08-13 NOTE — Evaluation (Signed)
Physical Therapy Evaluation Patient Details Name: Kiara Perry MRN: [id number] DOB: 12/22/1934 Today's Date: 08/13/2022  History of Present Illness  Kiara Perry is a 87 y.o. female with history of hypertension, hyperlipidemia, ED, PE, osteoarthritis, osteoporosis, GERD, who presents with generalized weakness and concern for possible UTI.  The patient and family state that she was started on antibiotics for UTI yesterday and took 2 doses, however today she was weak and confused.  The patient states she had some abdominal pain earlier but is not having any pain currently.  She just feels weak and has had fever and chills.   Clinical Impression  Pt is a 87 year old F admitted to hospital on 08/12/22 for syncope and UTI. At baseline, pt is Ind with ADL's, IADL's, community ambulation without AD, driving, and medication management. Pt presents with adequate strength, balance, sensation, cognition, and endurance required for safe functional mobility. Pt was grossly Ind with bed mobility, transfers, and ambulating a total of 68f without AD. Able to perform toileting, pericare, and hygiene independently without difficulty/LOB. Pt noting that she is grossly back to baseline. Therefore, pt is not an appropriate candidate for skilled acute PT services at this time; PT to sign off.   Recommend return home witout PT follow up or DME. Pt appropriate for Ind ambulation in room. BSC placed next to bed/recliner as pt still has IV fluids running. RN agreeable.        Recommendations for follow up therapy are one component of a multi-disciplinary discharge planning process, led by the attending physician.  Recommendations may be updated based on patient status, additional functional criteria and insurance authorization.  Follow Up Recommendations No PT follow up      Assistance Recommended at Discharge PRN     Equipment Recommendations None recommended by PT     Functional Status Assessment  Patient has not had a recent decline in their functional status     Precautions / Restrictions Restrictions Weight Bearing Restrictions: No      Mobility  Bed Mobility Overal bed mobility: Independent             General bed mobility comments: HOB elevated    Transfers Overall transfer level: Independent                 General transfer comment: STS from EOB and low height commode without AD    Ambulation/Gait Ambulation/Gait assistance: Independent Gait Distance (Feet): 30 Feet (173ffrom EOB>bathroom; 2083from bathroom>recliner) Assistive device: None         General Gait Details: Supervision for safety but able to be Ind. No AD, with intermittent reaching outside BOS for steadying. Reports gait is at baseline.     Balance Overall balance assessment: Independent                                           Pertinent Vitals/Pain Pain Assessment Pain Assessment: Faces Faces Pain Scale: Hurts a little bit Pain Location: burning with urination    Home Living Family/patient expects to be discharged to:: Private residence Living Arrangements: Spouse/significant other Available Help at Discharge: Family Type of Home: House Home Access: Stairs to enter Entrance Stairs-Rails: Left Entrance Stairs-Number of Steps: 5 STE garage; has chair lift if needed   Home Layout: One level Home Equipment: RolConservation officer, nature wheels);Cane - single point;Shower seat - built in;Grab bars -  tub/shower;Hand held shower head      Prior Function Prior Level of Function : Driving;Independent/Modified Independent             Mobility Comments: Ind with unlimited ambulation without AD. No hx of falls in past 52mo ADLs Comments: Ind with ADL's, IADL's, and medication management     Hand Dominance   Dominant Hand: Right    Extremity/Trunk Assessment   Upper Extremity Assessment Upper Extremity Assessment: Overall WFL for tasks assessed (grossly 4+/5;  sensation intact)    Lower Extremity Assessment Lower Extremity Assessment: Overall WFL for tasks assessed (grossly 4+/5; sensation intact)    Cervical / Trunk Assessment Cervical / Trunk Assessment: Normal  Communication   Communication: No difficulties  Cognition Arousal/Alertness: Awake/alert Behavior During Therapy: WFL for tasks assessed/performed Overall Cognitive Status: Within Functional Limits for tasks assessed                                 General Comments: A&O x4; able to follow multistep commands        General Comments General comments (skin integrity, edema, etc.): no balance deficits or LOB noted with quick transfer and ambulation to bathroom without AD    Exercises Other Exercises Other Exercises: Participates in bed mobility, transfers, and gait without AD. Reports functional mobility is grossly at baseline. Other Exercises: Pt educated re: PT role/POC, DC recommendations, ambulation in room for toileting.   Assessment/Plan    PT Assessment Patient does not need any further PT services         PT Goals (Current goals can be found in the Care Plan section)  Acute Rehab PT Goals Patient Stated Goal: "get back to being Ind" PT Goal Formulation: With patient Time For Goal Achievement: 08/27/22 Potential to Achieve Goals: Good     AM-PAC PT "6 Clicks" Mobility  Outcome Measure Help needed turning from your back to your side while in a flat bed without using bedrails?: None Help needed moving from lying on your back to sitting on the side of a flat bed without using bedrails?: None Help needed moving to and from a bed to a chair (including a wheelchair)?: None Help needed standing up from a chair using your arms (e.g., wheelchair or bedside chair)?: None Help needed to walk in hospital room?: None Help needed climbing 3-5 steps with a railing? : None 6 Click Score: 24    End of Session Equipment Utilized During Treatment: Gait  belt Activity Tolerance: Patient tolerated treatment well Patient left: in chair;with call bell/phone within reach Nurse Communication: Mobility status      Time: 19937-1696PT Time Calculation (min) (ACUTE ONLY): 25 min   Charges:   PT Evaluation $PT Eval Low Complexity: 1 Low         SHerminio Commons PT, DPT 2:43 PM,08/13/22 Physical Therapist - [facility]

## 2022-08-13 NOTE — Plan of Care (Signed)

## 2022-08-13 NOTE — Progress Notes (Addendum)
PROGRESS NOTE    Kiara Perry  QMV:[id number] DOB: [birth year] DOA: 08/12/2022 PCP: Idelle Crouch, MD   Brief Narrative: 87 year old with past medical history significant for bilateral hearing loss, chronic headaches, GERD, history of PE, hypertension, hyperlipidemia who presents complaining of dysuria, stomach pain, back pain symptoms started 4 days prior to admission.  She was recently started on antibiotics to treat UTI.  Triage area she had a syncope episode.  Admitted for UTI and syncope  Assessment & Plan:   Principal Problem:   Syncope Active Problems:   GERD (gastroesophageal reflux disease)   Hyperlipidemia   Bradycardia   Coronary artery disease involving native coronary artery of native heart   Hypertension   UTI (urinary tract infection)  1--Syncope: Suspect in the setting of dehydration and hypotension in the setting of infection. Troponins negative. Continue with IV fluids Monitor on telemetry Holding  bisoprolol due to bradycardia.  Due to prior history of PE, will get D dimer. D dimer positive, check CTA and doppler  2-UTI: Outpatient UA showed more than 112 white blood cell.   UA with more than 50 white blood cell. Continue with IV ceftriaxone and IV fluids CT Renal protocol negative for kidney stone Blood culture no growth today. Urine culture: Pending 3-Bradycardia: We will hold bisoprolol.  4-GERD; Continue with PPI.   Mild transaminases; suspect related to infection. Follow trend.  Mild hyponatremia: Continue with IV fluids    Estimated body mass index is 29.05 kg/m as calculated from the following:   Height as of this encounter: '5\' 6"'$  (1.676 m).   Weight as of this encounter: 81.6 kg.   DVT prophylaxis: Lovenox Code Status: DNR Family Communication: Care discussed with husband Disposition Plan:  Status is: Observation The patient remains OBS appropriate and will d/c before 2 midnights.    Consultants:   none    Antimicrobials:  IV ceftriaxone  Subjective: Feeling better, she is not feeling any dizziness, she still having some dysuria  Objective: Vitals:   08/12/22 1824 08/12/22 2024 08/13/22 0510 08/13/22 1000  BP:  117/78 122/61 (!) 125/51  Pulse:  62 (!) 56 (!) 57  Resp:  '18 16 18  '$ Temp: 98.7 F (37.1 C) 99.2 F (37.3 C) 98.2 F (36.8 C) 97.8 F (36.6 C)  TempSrc: Oral   Oral  SpO2:  100% 97% 99%  Weight:      Height:        Intake/Output Summary (Last 24 hours) at 08/13/2022 1430 Last data filed at 08/13/2022 0500 Gross per 24 hour  Intake --  Output 400 ml  Net -400 ml   Filed Weights   08/12/22 0900  Weight: 81.6 kg    Examination:  General exam: Appears calm and comfortable  Respiratory system: Clear to auscultation. Respiratory effort normal. Cardiovascular system: S1 & S2 heard, RRR. No JVD, murmurs, rubs, gallops or clicks. No pedal edema. Gastrointestinal system: Abdomen is nondistended, soft and nontender. No organomegaly or masses felt. Normal bowel sounds heard. Central nervous system: Alert and oriented. No focal neurological deficits. Extremities: Symmetric 5 x 5 power.   Data Reviewed: I have personally reviewed following labs and imaging studies  CBC: Recent Labs  Lab 08/12/22 0908 08/13/22 0413  WBC 11.0* 3.4*  NEUTROABS 9.3*  --   HGB 12.3 10.7*  HCT 37.7 32.0*  MCV 90.4 90.9  PLT 230 284   Basic Metabolic Panel: Recent Labs  Lab 08/12/22 0908 08/13/22 0413  NA 134* 133*  K 3.7 3.8  CL 102 104  CO2 23 25  GLUCOSE 134* 87  BUN 19 13  CREATININE 1.08* 0.90  CALCIUM 9.3 8.5*   GFR: Estimated Creatinine Clearance: 50.1 mL/min (by C-G formula based on SCr of 0.9 mg/dL). Liver Function Tests: Recent Labs  Lab 08/12/22 0908 08/13/22 0413  AST 36 85*  ALT 26 72*  ALKPHOS 90 94  BILITOT 1.3* 0.6  PROT 6.7 5.3*  ALBUMIN 3.6 2.8*   No results for input(s): "LIPASE", "AMYLASE" in the last 168 hours. No results for  input(s): "AMMONIA" in the last 168 hours. Coagulation Profile: No results for input(s): "INR", "PROTIME" in the last 168 hours. Cardiac Enzymes: No results for input(s): "CKTOTAL", "CKMB", "CKMBINDEX", "TROPONINI" in the last 168 hours. BNP (last 3 results) No results for input(s): "PROBNP" in the last 8760 hours. HbA1C: No results for input(s): "HGBA1C" in the last 72 hours. CBG: Recent Labs  Lab 08/12/22 0903  GLUCAP 138*   Lipid Profile: No results for input(s): "CHOL", "HDL", "LDLCALC", "TRIG", "CHOLHDL", "LDLDIRECT" in the last 72 hours. Thyroid Function Tests: No results for input(s): "TSH", "T4TOTAL", "FREET4", "T3FREE", "THYROIDAB" in the last 72 hours. Anemia Panel: No results for input(s): "VITAMINB12", "FOLATE", "FERRITIN", "TIBC", "IRON", "RETICCTPCT" in the last 72 hours. Sepsis Labs: Recent Labs  Lab 08/12/22 0908 08/12/22 1115  LATICACIDVEN 1.5 1.4    Recent Results (from the past 240 hour(s))  Resp Panel by RT-PCR (Flu A&B, Covid) Anterior Nasal Swab     Status: None   Collection Time: 08/12/22  8:57 AM   Specimen: Anterior Nasal Swab  Result Value Ref Range Status   SARS Coronavirus 2 by RT PCR NEGATIVE NEGATIVE Final    Comment: (NOTE) SARS-CoV-2 target nucleic acids are NOT DETECTED.  The SARS-CoV-2 RNA is generally detectable in upper respiratory specimens during the acute phase of infection. The lowest concentration of SARS-CoV-2 viral copies this assay can detect is 138 copies/mL. A negative result does not preclude SARS-Cov-2 infection and should not be used as the sole basis for treatment or other patient management decisions. A negative result may occur with  improper specimen collection/handling, submission of specimen other than nasopharyngeal swab, presence of viral mutation(s) within the areas targeted by this assay, and inadequate number of viral copies(<138 copies/mL). A negative result must be combined with clinical observations,  patient history, and epidemiological information. The expected result is Negative.  Fact Sheet for Patients:  EntrepreneurPulse.com.au  Fact Sheet for Healthcare Providers:  IncredibleEmployment.be  This test is no t yet approved or cleared by the [place] FDA and  has been authorized for detection and/or diagnosis of SARS-CoV-2 by FDA under an Emergency Use Authorization (EUA). This EUA will remain  in effect (meaning this test can be used) for the duration of the COVID-19 declaration under Section 564(b)(1) of the Act, 21 U.S.C.section 360bbb-3(b)(1), unless the authorization is terminated  or revoked sooner.       Influenza A by PCR NEGATIVE NEGATIVE Final   Influenza B by PCR NEGATIVE NEGATIVE Final    Comment: (NOTE) The Xpert Xpress SARS-CoV-2/FLU/RSV plus assay is intended as an aid in the diagnosis of influenza from Nasopharyngeal swab specimens and should not be used as a sole basis for treatment. Nasal washings and aspirates are unacceptable for Xpert Xpress SARS-CoV-2/FLU/RSV testing.  Fact Sheet for Patients: EntrepreneurPulse.com.au  Fact Sheet for Healthcare Providers: IncredibleEmployment.be  This test is not yet approved or cleared by the [place] FDA and has been authorized for detection and/or diagnosis  of SARS-CoV-2 by FDA under an Emergency Use Authorization (EUA). This EUA will remain in effect (meaning this test can be used) for the duration of the COVID-19 declaration under Section 564(b)(1) of the Act, 21 U.S.C. section 360bbb-3(b)(1), unless the authorization is terminated or revoked.  Performed at [facility], [address] [place], [place] [address]   Blood Culture (routine x 2)     Status: None (Preliminary result)   Collection Time: 08/12/22  9:08 AM   Specimen: BLOOD  Result Value Ref Range Status   Specimen Description BLOOD RIGHT ARM  Final    Special Requests   Final    BOTTLES DRAWN AEROBIC AND ANAEROBIC Blood Culture results may not be optimal due to an excessive volume of blood received in culture bottles   Culture   Final    NO GROWTH 1 DAY Performed at [facility], [address], [place], [place] [address]    Report Status PENDING  Incomplete  Blood Culture (routine x 2)     Status: None (Preliminary result)   Collection Time: 08/13/22  4:13 AM   Specimen: BLOOD  Result Value Ref Range Status   Specimen Description BLOOD BLOOD LEFT HAND  Final   Special Requests   Final    BOTTLES DRAWN AEROBIC AND ANAEROBIC Blood Culture adequate volume   Culture   Final    NO GROWTH < 12 HOURS Performed at [facility], [address], [place], [place] [address]    Report Status PENDING  Incomplete         Radiology Studies: CT RENAL STONE STUDY  Result Date: 08/12/2022 CLINICAL DATA:  Fever, weakness, and flank pain. Diagnosis of urinary tract infection four days ago. EXAM: CT ABDOMEN AND PELVIS WITHOUT CONTRAST TECHNIQUE: Multidetector CT imaging of the abdomen and pelvis was performed following the standard protocol without IV contrast. RADIATION DOSE REDUCTION: This exam was performed according to the departmental dose-optimization program which includes automated exposure control, adjustment of the mA and/or kV according to patient size and/or use of iterative reconstruction technique. COMPARISON:  CT scan 04/09/2010 FINDINGS: Lower chest: Descending thoracic aortic atherosclerotic vascular disease. Small type 1 hiatal hernia. Low-density blood pool suggests anemia. Mild cardiomegaly. Hepatobiliary: Unremarkable Pancreas: Unremarkable Spleen: Unremarkable Adrenals/Urinary Tract: No obvious perirenal stranding or significant renal asymmetry to indicate pyelonephritis although IV contrast was not administered and accordingly sensitivity for pyelonephritis is reduced compared to a contrast enhanced CT. No  urinary tract calculi are observed. No hydronephrosis or hydroureter. Adrenal glands normal. Trace gas in the urinary bladder, query recent catheterization. Stomach/Bowel: Sigmoid colon diverticulosis. Low position of the anorectal junction suggesting pelvic floor laxity. Vascular/Lymphatic: Atherosclerosis is present, including aortoiliac atherosclerotic disease. No pathologic adenopathy in the abdomen or pelvis. Reproductive: Uterus absent.  Adnexa unremarkable. Other: Mild hazy central mesenteric stranding for example on image 37 series 2, increased from 04/09/2010, pattern of lucency around lymph nodes and vessels raises suspicion for mild sclerosing mesenteritis. Musculoskeletal: Dextroconvex lumbar scoliosis with rotary component. Lumbar spondylosis and degenerative disc disease. IMPRESSION: 1. A specific cause for the patient's fever and flank pain is not identified on today's noncontrast CT scan. 2. Mild hazy central mesenteric stranding raises suspicion for mild sclerosing mesenteritis. 3. Small type 1 hiatal hernia. 4. Low position of the anorectal junction suggesting pelvic floor laxity. 5. Sigmoid colon diverticulosis. 6. Low-density blood pool suggests anemia. 7. Dextroconvex lumbar scoliosis with rotary component. Lumbar spondylosis and degenerative disc disease. 8. Trace gas in the urinary  bladder, query recent catheterization. 9. Aortic atherosclerosis. Aortic Atherosclerosis (ICD10-I70.0). Electronically Signed   By: Van Clines M.D.   On: 08/12/2022 14:19   DG Chest Port 1 View  Result Date: 08/12/2022 CLINICAL DATA:  Questionable sepsis.  Diagnosed with UTI on Monday. EXAM: PORTABLE CHEST 1 VIEW COMPARISON:  CXR 10/10/10 FINDINGS: No pleural effusion. No pneumothorax. Unchanged cardiac and mediastinal contours. No focal airspace opacity. No displaced rib fractures. Degenerative changes of the bilateral AC and glenohumeral joints. Visualized upper abdomen is unremarkable. IMPRESSION: No  acute cardiopulmonary abnormality. Electronically Signed   By: Marin Roberts M.D.   On: 08/12/2022 09:36        Scheduled Meds:  celecoxib  200 mg Oral Daily   enoxaparin (LOVENOX) injection  40 mg Subcutaneous Q24H   gabapentin  200 mg Oral QHS   pantoprazole  40 mg Oral Daily   pravastatin  80 mg Oral q1800   Continuous Infusions:  cefTRIAXone (ROCEPHIN)  IV 2 g (08/13/22 0812)   lactated ringers 100 mL/hr at 08/12/22 2146     LOS: 0 days    Time spent: 35 minutes    Danah Reinecke A Jhayden Demuro, MD [facility]   If 7PM-7AM, please contact night-coverage [email or web address]  08/13/2022, 2:30 PM

## 2022-08-14 DIAGNOSIS — R55 Syncope and collapse: Secondary | ICD-10-CM | POA: Diagnosis not present

## 2022-08-14 LAB — CBC
HCT: 31.5 % — ABNORMAL LOW (ref 36.0–46.0)
Hemoglobin: 10.3 g/dL — ABNORMAL LOW (ref 12.0–15.0)
MCH: 29.8 pg (ref 26.0–34.0)
MCHC: 32.7 g/dL (ref 30.0–36.0)
MCV: 91 fL (ref 80.0–100.0)
Platelets: 178 10*3/uL (ref 150–400)
RBC: 3.46 MIL/uL — ABNORMAL LOW (ref 3.87–5.11)
RDW: 14 % (ref 11.5–15.5)
WBC: 4.3 10*3/uL (ref 4.0–10.5)
nRBC: 0 % (ref 0.0–0.2)

## 2022-08-14 LAB — COMPREHENSIVE METABOLIC PANEL
ALT: 54 U/L — ABNORMAL HIGH (ref 0–44)
AST: 46 U/L — ABNORMAL HIGH (ref 15–41)
Albumin: 2.7 g/dL — ABNORMAL LOW (ref 3.5–5.0)
Alkaline Phosphatase: 88 U/L (ref 38–126)
Anion gap: 7 (ref 5–15)
BUN: 12 mg/dL (ref 8–23)
CO2: 24 mmol/L (ref 22–32)
Calcium: 8.4 mg/dL — ABNORMAL LOW (ref 8.9–10.3)
Chloride: 103 mmol/L (ref 98–111)
Creatinine, Ser: 0.83 mg/dL (ref 0.44–1.00)
GFR, Estimated: >60 mL/min (ref >60)
Glucose, Bld: 85 mg/dL (ref 70–99)
Potassium: 3.7 mmol/L (ref 3.5–5.1)
Sodium: 134 mmol/L — ABNORMAL LOW (ref 135–145)
Total Bilirubin: 0.7 mg/dL (ref 0.3–1.2)
Total Protein: 5.3 g/dL — ABNORMAL LOW (ref 6.5–8.1)

## 2022-08-14 LAB — URINE CULTURE: Culture: NO GROWTH

## 2022-08-14 MED ORDER — IRBESARTAN 150 MG PO TABS
150.0000 mg | ORAL_TABLET | Freq: Every day | ORAL | Status: DC
  Administered 2022-08-14: 150 mg via ORAL
  Filled 2022-08-14: qty 1

## 2022-08-14 MED ORDER — AMLODIPINE BESYLATE 5 MG PO TABS: 5.0000 mg | ORAL_TABLET | Freq: Every day | ORAL | Status: DC

## 2022-08-14 MED ORDER — AMLODIPINE BESYLATE 5 MG PO TABS: 2.5000 mg | ORAL_TABLET | Freq: Every day | ORAL | Status: DC

## 2022-08-14 MED ORDER — AMLODIPINE BESYLATE 10 MG PO TABS: 10.0000 mg | ORAL_TABLET | Freq: Every day | ORAL | Status: DC

## 2022-08-14 MED ORDER — AMLODIPINE BESYLATE 5 MG PO TABS: 5.0000 mg | ORAL_TABLET | Freq: Every day | ORAL | 11 refills | Status: AC

## 2022-08-14 MED ORDER — AMLODIPINE BESYLATE 5 MG PO TABS
2.5000 mg | ORAL_TABLET | Freq: Every day | ORAL | Status: DC
  Administered 2022-08-14: 2.5 mg via ORAL
  Filled 2022-08-14: qty 1

## 2022-08-14 MED ORDER — CEPHALEXIN 500 MG PO CAPS: 500.0000 mg | ORAL_CAPSULE | Freq: Three times a day (TID) | ORAL | 0 refills | Status: AC

## 2022-08-14 MED ORDER — AMLODIPINE BESYLATE 5 MG PO TABS
5.0000 mg | ORAL_TABLET | Freq: Every day | ORAL | Status: AC
  Administered 2022-08-14: 5 mg via ORAL
  Filled 2022-08-14: qty 1

## 2022-08-14 NOTE — Care Management Obs Status (Signed)
[facility]   Patient Details  Name: SAYDA GRABLE MRN: [id number] Date of Birth: Oct 23, 1935   Medicare Observation Status Notification Given:  Yes    Glennon Kopko A Maylon Sailors, LCSW 08/14/2022, 9:24 AM

## 2022-08-14 NOTE — TOC CM/SW Note (Signed)
Patient has orders to discharge home today. Chart reviewed. No TOC needs identified. CSW signing off.  Cedrick Partain, CSW [phone]  

## 2022-08-14 NOTE — Progress Notes (Signed)
Discharge instructions were reviewed with pt and her husband. Pt received po medication to help with high BP. IV was taken out. Questions were answered.

## 2022-08-14 NOTE — Progress Notes (Signed)
Informed Hospitalist Morrison patient's BP 174/70. No orders received.

## 2022-08-14 NOTE — Discharge Summary (Signed)
Physician Discharge Summary   Patient: Kiara Perry MRN: [id number] DOB: [birth year]  Admit date:     08/12/2022  Discharge date: 08/14/22  Discharge Physician: Elmarie Shiley   PCP: Idelle Crouch, MD   Recommendations at discharge:   Needs Follow CT chest  to follow on Indeterminate bilateral hilar lymphadenopathy. Prominent paraesophageal lymph node measuring 0.6 cm. Recommend attention on follow-up. Follow up resolution UTI Adjustment BP medication. Bisoprolol discontinue due to bradycardia.    Discharge Diagnoses: Principal Problem:   Syncope Active Problems:   GERD (gastroesophageal reflux disease)   Hyperlipidemia   Bradycardia   Coronary artery disease involving native coronary artery of native heart   Hypertension   UTI (urinary tract infection)  Resolved Problems:   * No resolved hospital problems. *  Hospital Course: 87 year old with past medical history significant for bilateral hearing loss, chronic headaches, GERD, history of PE, hypertension, hyperlipidemia who presents complaining of dysuria, stomach pain, back pain symptoms started 4 days prior to admission.  She was recently started on antibiotics to treat UTI.  Triage area she had a syncope episode.   Admitted for UTI and syncope  Assessment and Plan:   1--Syncope: Suspect in the setting of dehydration and hypotension in the setting of infection. Troponins negative. Treated with IV fluids Monitor on telemetry Holding  bisoprolol due to bradycardia.  D dimer positive, CTA negative for PE, doppler LE negative,  No further episodes.   2-UTI: Outpatient UA showed more than 112 white blood cell.   UA with more than 50 white blood cell. Treated  with IV ceftriaxone and IV fluids CT Renal protocol negative for kidney stone Blood culture no growth today. Urine culture: Pno growth but was on antibiotics prior to admission.  Discharge on Keflex for 5 days.   3-Bradycardia: We will hold  bisoprolol.  4-GERD; Continue with PPI.   Mild transaminases; suspect related to infection. Follow trend.  Mild hyponatremia: Treated with IV fluids  HTN; resume MIcardis , stop bisoprolol due to bradycardia, started on Norvasc.         Consultants: None Procedures performed: doppler Disposition: Home Diet recommendation:  Discharge Diet Orders (From admission, onward)     Start     Ordered   08/14/22 0000  Diet - low sodium heart healthy        08/14/22 1410           Cardiac diet DISCHARGE MEDICATION: Allergies as of 08/14/2022       Reactions   Levofloxacin Nausea Only        Medication List     STOP taking these medications    bisoprolol 5 MG tablet Commonly known as: ZEBETA   nitrofurantoin (macrocrystal-monohydrate) 100 MG capsule Commonly known as: MACROBID       TAKE these medications    acetaminophen 500 MG tablet Commonly known as: TYLENOL Take 500-1,000 mg by mouth every 6 (six) hours as needed (for pain).   amLODipine 5 MG tablet Commonly known as: NORVASC Take 1 tablet (5 mg total) by mouth daily.   Calcium Carbonate-Vitamin D 600-400 MG-UNIT tablet Take 1 tablet by mouth in the morning and at bedtime.   celecoxib 200 MG capsule Commonly known as: CELEBREX Take 200 mg by mouth daily.   cephALEXin 500 MG capsule Commonly known as: KEFLEX Take 1 capsule (500 mg total) by mouth 3 (three) times daily for 5 days.   gabapentin 100 MG capsule Commonly known as: NEURONTIN Take 200 mg by  mouth at bedtime.   lovastatin 40 MG tablet Commonly known as: MEVACOR Take 80 mg by mouth at bedtime.   omeprazole 40 MG capsule Commonly known as: PRILOSEC Take 40 mg by mouth in the morning.   ondansetron 4 MG tablet Commonly known as: Zofran Take 1 tablet (4 mg total) by mouth every 8 (eight) hours as needed for nausea or vomiting.   telmisartan 40 MG tablet Commonly known as: MICARDIS Take 80 mg by mouth in the morning.   torsemide 10  MG tablet Commonly known as: DEMADEX Take 10 mg by mouth in the morning.   traMADol 50 MG tablet Commonly known as: ULTRAM Take 50 mg by mouth every 6 (six) hours as needed for moderate pain. for pain        Discharge Exam: Filed Weights   08/12/22 0900  Weight: 81.6 kg   General; NAD  Condition at discharge: stable  The results of significant diagnostics from this hospitalization (including imaging, microbiology, ancillary and laboratory) are listed below for reference.   Imaging Studies: US Venous Img Lower Bilateral (DVT)  Result Date: 08/14/2022 CLINICAL DATA:  Lower extremity edema EXAM: BILATERAL LOWER EXTREMITY VENOUS DOPPLER ULTRASOUND TECHNIQUE: Gray-scale sonography with graded compression, as well as color Doppler and duplex ultrasound were performed to evaluate the lower extremity deep venous systems from the level of the common femoral vein and including the common femoral, femoral, profunda femoral, popliteal and calf veins including the posterior tibial, peroneal and gastrocnemius veins when visible. The superficial great saphenous vein was also interrogated. Spectral Doppler was utilized to evaluate flow at rest and with distal augmentation maneuvers in the common femoral, femoral and popliteal veins. COMPARISON:  None Available. FINDINGS: RIGHT LOWER EXTREMITY Common Femoral Vein: No evidence of thrombus. Normal compressibility, respiratory phasicity and response to augmentation. Saphenofemoral Junction: No evidence of thrombus. Normal compressibility and flow on color Doppler imaging. Profunda Femoral Vein: No evidence of thrombus. Normal compressibility and flow on color Doppler imaging. Femoral Vein: No evidence of thrombus. Normal compressibility, respiratory phasicity and response to augmentation. Popliteal Vein: No evidence of thrombus. Normal compressibility, respiratory phasicity and response to augmentation. Calf Veins: No evidence of thrombus. Normal  compressibility and flow on color Doppler imaging. Superficial Great Saphenous Vein: No evidence of thrombus. Normal compressibility. Venous Reflux:  None. Other Findings:  None. LEFT LOWER EXTREMITY Common Femoral Vein: No evidence of thrombus. Normal compressibility, respiratory phasicity and response to augmentation. Saphenofemoral Junction: No evidence of thrombus. Normal compressibility and flow on color Doppler imaging. Profunda Femoral Vein: No evidence of thrombus. Normal compressibility and flow on color Doppler imaging. Femoral Vein: No evidence of thrombus. Normal compressibility, respiratory phasicity and response to augmentation. Popliteal Vein: No evidence of thrombus. Normal compressibility, respiratory phasicity and response to augmentation. Calf Veins: No evidence of thrombus. Normal compressibility and flow on color Doppler imaging. Superficial Great Saphenous Vein: No evidence of thrombus. Normal compressibility. Venous Reflux:  None. Other Findings:  None. IMPRESSION: No evidence of deep venous thrombosis in either lower extremity. Electronically Signed   By: Jacqulynn Cadet M.D.   On: 08/14/2022 07:10   CT Angio Chest Pulmonary Embolism (PE) W or WO Contrast  Result Date: 08/13/2022 CLINICAL DATA:  Syncope, simple, normal neuro exam EXAM: CT ANGIOGRAPHY CHEST WITH CONTRAST TECHNIQUE: Multidetector CT imaging of the chest was performed using the standard protocol during bolus administration of intravenous contrast. Multiplanar CT image reconstructions and MIPs were obtained to evaluate the vascular anatomy. RADIATION DOSE REDUCTION: This exam  was performed according to the departmental dose-optimization program which includes automated exposure control, adjustment of the mA and/or kV according to patient size and/or use of iterative reconstruction technique. CONTRAST:  38m OMNIPAQUE IOHEXOL 350 MG/ML SOLN COMPARISON:  CT angiography chest 05/30/2018 FINDINGS: Cardiovascular: Satisfactory  opacification of the pulmonary arteries to the segmental level. No evidence of pulmonary embolism. Normal heart size. No significant pericardial effusion. The thoracic aorta is normal in caliber. Moderate to severe atherosclerotic plaque of the thoracic aorta. At least 3 vessel coronary artery calcifications. Mediastinum/Nodes: Right hilar lymphadenopathy measuring up to 1.5 cm. Left hilar lymphadenopathy measuring up to 1.2 cm (4:61). Prominent paraesophageal lymph node measuring 0.6 cm (4:89). No enlarged mediastinal or axillary lymph nodes. Thyroid gland, trachea, and esophagus demonstrate no significant findings. Small hiatal hernia. Lungs/Pleura: Expiratory phase of respiration. Diffuse bronchial wall thickening. Associated slight mosaic attenuation of the lungs. Lingular linear atelectasis versus scarring. No focal consolidation. Stable punctate calcified subpleural left upper lobe pulmonary nodule (7:105). No new pulmonary nodule. No pulmonary mass. Bilateral trace pleural effusions. No pneumothorax. Upper Abdomen: No acute abnormality. Musculoskeletal: No chest wall abnormality. No suspicious lytic or blastic osseous lesions. No acute displaced fracture. Multilevel degenerative changes of the spine. Severe degenerative changes of bilateral shoulders. Review of the MIP images confirms the above findings. IMPRESSION: 1. No pulmonary embolus. 2. Bilateral trace pleural effusions. 3. Diffuse bronchial wall thickening with mosaic attenuation of the lungs. Query small airway disease. 4. Indeterminate bilateral hilar lymphadenopathy. Prominent paraesophageal lymph node measuring 0.6 cm. Recommend attention on follow-up. 5. Small hiatal hernia. 6. Severe degenerative changes of bilateral shoulders. Electronically Signed   By: MIven FinnM.D.   On: 08/13/2022 17:59   CT RENAL STONE STUDY  Result Date: 08/12/2022 CLINICAL DATA:  Fever, weakness, and flank pain. Diagnosis of urinary tract infection four days  ago. EXAM: CT ABDOMEN AND PELVIS WITHOUT CONTRAST TECHNIQUE: Multidetector CT imaging of the abdomen and pelvis was performed following the standard protocol without IV contrast. RADIATION DOSE REDUCTION: This exam was performed according to the departmental dose-optimization program which includes automated exposure control, adjustment of the mA and/or kV according to patient size and/or use of iterative reconstruction technique. COMPARISON:  CT scan 04/09/2010 FINDINGS: Lower chest: Descending thoracic aortic atherosclerotic vascular disease. Small type 1 hiatal hernia. Low-density blood pool suggests anemia. Mild cardiomegaly. Hepatobiliary: Unremarkable Pancreas: Unremarkable Spleen: Unremarkable Adrenals/Urinary Tract: No obvious perirenal stranding or significant renal asymmetry to indicate pyelonephritis although IV contrast was not administered and accordingly sensitivity for pyelonephritis is reduced compared to a contrast enhanced CT. No urinary tract calculi are observed. No hydronephrosis or hydroureter. Adrenal glands normal. Trace gas in the urinary bladder, query recent catheterization. Stomach/Bowel: Sigmoid colon diverticulosis. Low position of the anorectal junction suggesting pelvic floor laxity. Vascular/Lymphatic: Atherosclerosis is present, including aortoiliac atherosclerotic disease. No pathologic adenopathy in the abdomen or pelvis. Reproductive: Uterus absent.  Adnexa unremarkable. Other: Mild hazy central mesenteric stranding for example on image 37 series 2, increased from 04/09/2010, pattern of lucency around lymph nodes and vessels raises suspicion for mild sclerosing mesenteritis. Musculoskeletal: Dextroconvex lumbar scoliosis with rotary component. Lumbar spondylosis and degenerative disc disease. IMPRESSION: 1. A specific cause for the patient's fever and flank pain is not identified on today's noncontrast CT scan. 2. Mild hazy central mesenteric stranding raises suspicion for mild  sclerosing mesenteritis. 3. Small type 1 hiatal hernia. 4. Low position of the anorectal junction suggesting pelvic floor laxity. 5. Sigmoid colon diverticulosis. 6. Low-density blood pool  suggests anemia. 7. Dextroconvex lumbar scoliosis with rotary component. Lumbar spondylosis and degenerative disc disease. 8. Trace gas in the urinary bladder, query recent catheterization. 9. Aortic atherosclerosis. Aortic Atherosclerosis (ICD10-I70.0). Electronically Signed   By: Van Clines M.D.   On: 08/12/2022 14:19   DG Chest Port 1 View  Result Date: 08/12/2022 CLINICAL DATA:  Questionable sepsis.  Diagnosed with UTI on Monday. EXAM: PORTABLE CHEST 1 VIEW COMPARISON:  CXR 10/10/10 FINDINGS: No pleural effusion. No pneumothorax. Unchanged cardiac and mediastinal contours. No focal airspace opacity. No displaced rib fractures. Degenerative changes of the bilateral AC and glenohumeral joints. Visualized upper abdomen is unremarkable. IMPRESSION: No acute cardiopulmonary abnormality. Electronically Signed   By: Marin Roberts M.D.   On: 08/12/2022 09:36    Microbiology: Results for orders placed or performed during the hospital encounter of 08/12/22  Resp Panel by RT-PCR (Flu A&B, Covid) Anterior Nasal Swab     Status: None   Collection Time: 08/12/22  8:57 AM   Specimen: Anterior Nasal Swab  Result Value Ref Range Status   SARS Coronavirus 2 by RT PCR NEGATIVE NEGATIVE Final    Comment: (NOTE) SARS-CoV-2 target nucleic acids are NOT DETECTED.  The SARS-CoV-2 RNA is generally detectable in upper respiratory specimens during the acute phase of infection. The lowest concentration of SARS-CoV-2 viral copies this assay can detect is 138 copies/mL. A negative result does not preclude SARS-Cov-2 infection and should not be used as the sole basis for treatment or other patient management decisions. A negative result may occur with  improper specimen collection/handling, submission of specimen other than  nasopharyngeal swab, presence of viral mutation(s) within the areas targeted by this assay, and inadequate number of viral copies(<138 copies/mL). A negative result must be combined with clinical observations, patient history, and epidemiological information. The expected result is Negative.  Fact Sheet for Patients:  EntrepreneurPulse.com.au  Fact Sheet for Healthcare Providers:  IncredibleEmployment.be  This test is no t yet approved or cleared by the [place] FDA and  has been authorized for detection and/or diagnosis of SARS-CoV-2 by FDA under an Emergency Use Authorization (EUA). This EUA will remain  in effect (meaning this test can be used) for the duration of the COVID-19 declaration under Section 564(b)(1) of the Act, 21 U.S.C.section 360bbb-3(b)(1), unless the authorization is terminated  or revoked sooner.       Influenza A by PCR NEGATIVE NEGATIVE Final   Influenza B by PCR NEGATIVE NEGATIVE Final    Comment: (NOTE) The Xpert Xpress SARS-CoV-2/FLU/RSV plus assay is intended as an aid in the diagnosis of influenza from Nasopharyngeal swab specimens and should not be used as a sole basis for treatment. Nasal washings and aspirates are unacceptable for Xpert Xpress SARS-CoV-2/FLU/RSV testing.  Fact Sheet for Patients: EntrepreneurPulse.com.au  Fact Sheet for Healthcare Providers: IncredibleEmployment.be  This test is not yet approved or cleared by the [place] FDA and has been authorized for detection and/or diagnosis of SARS-CoV-2 by FDA under an Emergency Use Authorization (EUA). This EUA will remain in effect (meaning this test can be used) for the duration of the COVID-19 declaration under Section 564(b)(1) of the Act, 21 U.S.C. section 360bbb-3(b)(1), unless the authorization is terminated or revoked.  Performed at [facility], [address] [place], [place]  [address]   Blood Culture (routine x 2)     Status: None (Preliminary result)   Collection Time: 08/12/22  9:08 AM   Specimen: BLOOD  Result Value Ref Range Status  Specimen Description BLOOD RIGHT ARM  Final   Special Requests   Final    BOTTLES DRAWN AEROBIC AND ANAEROBIC Blood Culture results may not be optimal due to an excessive volume of blood received in culture bottles   Culture   Final    NO GROWTH 2 DAYS Performed at [facility], [address] [place], [place] [address]    Report Status PENDING  Incomplete  Urine Culture     Status: None   Collection Time: 08/12/22 11:12 AM   Specimen: Urine, Clean Catch  Result Value Ref Range Status   Specimen Description   Final    URINE, CLEAN CATCH Performed at [facility], [address] [place], [place] [address]    Special Requests   Final    NONE Performed at [facility], [address], [place], [place] [address]    Culture   Final    NO GROWTH Performed at [facility], [place] [address] [place], [place] [address]    Report Status 08/14/2022 FINAL  Final  Blood Culture (routine x 2)     Status: None (Preliminary result)   Collection Time: 08/13/22  4:13 AM   Specimen: BLOOD  Result Value Ref Range Status   Specimen Description BLOOD BLOOD LEFT HAND  Final   Special Requests   Final    BOTTLES DRAWN AEROBIC AND ANAEROBIC Blood Culture adequate volume   Culture   Final    NO GROWTH 1 DAY Performed at [facility], [address] [place], [place] [address]    Report Status PENDING  Incomplete    Labs: CBC: Recent Labs  Lab 08/12/22 0908 08/13/22 0413 08/14/22 0410  WBC 11.0* 3.4* 4.3  NEUTROABS 9.3*  --   --   HGB 12.3 10.7* 10.3*  HCT 37.7 32.0* 31.5*  MCV 90.4 90.9 91.0  PLT 230 177 889   Basic Metabolic Panel: Recent Labs  Lab 08/12/22 0908 08/13/22 0413 08/14/22 0410  NA 134* 133* 134*  K 3.7 3.8 3.7  CL 102 104 103  CO2 '23 25 24  '$ GLUCOSE 134* 87  85  BUN '19 13 12  '$ CREATININE 1.08* 0.90 0.83  CALCIUM 9.3 8.5* 8.4*   Liver Function Tests: Recent Labs  Lab 08/12/22 0908 08/13/22 0413 08/14/22 0410  AST 36 85* 46*  ALT 26 72* 54*  ALKPHOS 90 94 88  BILITOT 1.3* 0.6 0.7  PROT 6.7 5.3* 5.3*  ALBUMIN 3.6 2.8* 2.7*   CBG: Recent Labs  Lab 08/12/22 0903  GLUCAP 138*    Discharge time spent: greater than 30 minutes.  Signed: Elmarie Shiley, MD [facility] 08/14/2022

## 2022-08-17 LAB — CULTURE, BLOOD (ROUTINE X 2): Culture: NO GROWTH

## 2022-08-18 LAB — CULTURE, BLOOD (ROUTINE X 2)
Culture: NO GROWTH
Special Requests: ADEQUATE

## 2022-08-19 DIAGNOSIS — Z09 Encounter for follow-up examination after completed treatment for conditions other than malignant neoplasm: Secondary | ICD-10-CM | POA: Diagnosis not present

## 2022-08-19 DIAGNOSIS — R7989 Other specified abnormal findings of blood chemistry: Secondary | ICD-10-CM | POA: Diagnosis not present

## 2022-08-19 DIAGNOSIS — N390 Urinary tract infection, site not specified: Secondary | ICD-10-CM | POA: Diagnosis not present

## 2022-08-19 DIAGNOSIS — R590 Localized enlarged lymph nodes: Secondary | ICD-10-CM | POA: Diagnosis not present

## 2022-08-30 DIAGNOSIS — R590 Localized enlarged lymph nodes: Secondary | ICD-10-CM | POA: Diagnosis not present

## 2022-09-05 ENCOUNTER — Ambulatory Visit
Admission: RE | Admit: 2022-09-05 | Discharge: 2022-09-05 | Disposition: A | Discharge status: Home / Self Care | Payer: PPO | Source: Ambulatory Visit | Attending: Internal Medicine | Admitting: Internal Medicine

## 2022-09-05 DIAGNOSIS — Z1231 Encounter for screening mammogram for malignant neoplasm of breast: Secondary | ICD-10-CM | POA: Diagnosis not present

## 2022-09-05 DIAGNOSIS — Z03818 Encounter for observation for suspected exposure to other biological agents ruled out: Secondary | ICD-10-CM | POA: Diagnosis not present

## 2022-09-06 DIAGNOSIS — R590 Localized enlarged lymph nodes: Secondary | ICD-10-CM | POA: Diagnosis not present

## 2022-09-06 DIAGNOSIS — R0602 Shortness of breath: Secondary | ICD-10-CM | POA: Diagnosis not present

## 2022-09-26 DIAGNOSIS — H11153 Pinguecula, bilateral: Secondary | ICD-10-CM | POA: Diagnosis not present

## 2022-09-30 DIAGNOSIS — E7800 Pure hypercholesterolemia, unspecified: Secondary | ICD-10-CM | POA: Diagnosis not present

## 2022-09-30 DIAGNOSIS — R7309 Other abnormal glucose: Secondary | ICD-10-CM | POA: Diagnosis not present

## 2022-09-30 DIAGNOSIS — I10 Essential (primary) hypertension: Secondary | ICD-10-CM | POA: Diagnosis not present

## 2022-09-30 DIAGNOSIS — Z79899 Other long term (current) drug therapy: Secondary | ICD-10-CM | POA: Diagnosis not present

## 2022-10-04 DIAGNOSIS — J811 Chronic pulmonary edema: Secondary | ICD-10-CM | POA: Diagnosis not present

## 2022-10-04 DIAGNOSIS — J90 Pleural effusion, not elsewhere classified: Secondary | ICD-10-CM | POA: Diagnosis not present

## 2022-10-04 DIAGNOSIS — I340 Nonrheumatic mitral (valve) insufficiency: Secondary | ICD-10-CM | POA: Diagnosis not present

## 2022-10-04 DIAGNOSIS — R590 Localized enlarged lymph nodes: Secondary | ICD-10-CM | POA: Diagnosis not present

## 2022-10-07 DIAGNOSIS — N1831 Chronic kidney disease, stage 3a: Secondary | ICD-10-CM | POA: Diagnosis not present

## 2022-10-07 DIAGNOSIS — E782 Mixed hyperlipidemia: Secondary | ICD-10-CM | POA: Diagnosis not present

## 2022-10-07 DIAGNOSIS — Z0000 Encounter for general adult medical examination without abnormal findings: Secondary | ICD-10-CM | POA: Diagnosis not present

## 2022-10-07 DIAGNOSIS — I10 Essential (primary) hypertension: Secondary | ICD-10-CM | POA: Diagnosis not present

## 2022-10-07 DIAGNOSIS — G894 Chronic pain syndrome: Secondary | ICD-10-CM | POA: Diagnosis not present

## 2022-10-07 DIAGNOSIS — R011 Cardiac murmur, unspecified: Secondary | ICD-10-CM | POA: Diagnosis not present

## 2022-10-07 DIAGNOSIS — I2510 Atherosclerotic heart disease of native coronary artery without angina pectoris: Secondary | ICD-10-CM | POA: Diagnosis not present

## 2022-10-07 DIAGNOSIS — G8929 Other chronic pain: Secondary | ICD-10-CM | POA: Diagnosis not present

## 2022-10-07 DIAGNOSIS — M5442 Lumbago with sciatica, left side: Secondary | ICD-10-CM | POA: Diagnosis not present

## 2022-10-13 ENCOUNTER — Encounter: Payer: Self-pay | Admitting: *Deleted

## 2022-10-13 ENCOUNTER — Telehealth: Payer: Self-pay | Admitting: *Deleted

## 2022-10-13 NOTE — Patient Outreach (Signed)
  Care Coordination   Initial Visit Note   10/13/2022 Name: BRAILEE RIEDE MRN: [id number] DOB: [birth year]  DARCIE MELLONE is a 87 y.o. year old female who sees Sparks, Leonie Douglas, MD for primary care. I spoke with  Gustavus Bryant by phone today.  What matters to the patients health and wellness today?  Report she is doing well since being hospitalized in October.  Denies any urgent concerns, encouraged to contact this care manager with questions.     Goals Addressed             This Visit's Progress    management of chronic conditions, including HTN       Care Coordination Interventions: Evaluation of current treatment plan related to hypertension self management and patient's adherence to plan as established by provider Provided education to patient re: stroke prevention, s/s of heart attack and stroke Reviewed medications with patient and discussed importance of compliance Discussed plans with patient for ongoing care management follow up and provided patient with direct contact information for care management team Reviewed scheduled/upcoming provider appointments including:  Discussed complications of poorly controlled blood pressure such as heart disease, stroke, circulatory complications, vision complications, kidney impairment, sexual dysfunction Screening for signs and symptoms of depression related to chronic disease state  Assessed social determinant of health barriers Appointments with pulmonary on 1/9 and PCP on 3/5.  Has echo scheduled for 1/4 as she was hospitalized recently for syncope.  Was seen by PCP last week Monitors blood pressure, today was 148/82, usually range 130s/80s         SDOH assessments and interventions completed:  Yes  SDOH Interventions Today    Flowsheet Row Most Recent Value  SDOH Interventions   Food Insecurity Interventions Intervention Not Indicated  Housing Interventions Intervention Not Indicated  Transportation  Interventions Intervention Not Indicated        Care Coordination Interventions:  Yes, provided   Follow up plan: Follow up call scheduled for 1/11    Encounter Outcome:  Pt. Visit Completed   Valente David, RN, MSN, [place] [facility] Care Management Coordinator [phone]

## 2022-10-18 ENCOUNTER — Other Ambulatory Visit: Payer: Self-pay | Admitting: Specialist

## 2022-10-18 DIAGNOSIS — J90 Pleural effusion, not elsewhere classified: Secondary | ICD-10-CM

## 2022-10-18 DIAGNOSIS — R590 Localized enlarged lymph nodes: Secondary | ICD-10-CM

## 2022-10-26 DIAGNOSIS — Z961 Presence of intraocular lens: Secondary | ICD-10-CM | POA: Diagnosis not present

## 2022-10-27 DIAGNOSIS — R011 Cardiac murmur, unspecified: Secondary | ICD-10-CM | POA: Diagnosis not present

## 2022-10-28 DIAGNOSIS — Z961 Presence of intraocular lens: Secondary | ICD-10-CM | POA: Diagnosis not present

## 2022-11-01 DIAGNOSIS — R0602 Shortness of breath: Secondary | ICD-10-CM | POA: Diagnosis not present

## 2022-11-01 DIAGNOSIS — I10 Essential (primary) hypertension: Secondary | ICD-10-CM | POA: Diagnosis not present

## 2022-11-01 DIAGNOSIS — R590 Localized enlarged lymph nodes: Secondary | ICD-10-CM | POA: Diagnosis not present

## 2022-11-03 ENCOUNTER — Ambulatory Visit: Payer: Self-pay | Admitting: *Deleted

## 2022-11-03 NOTE — Patient Outreach (Signed)
  Care Coordination   Follow Up Visit Note   11/03/2022 Name: CYNDEE GIAMMARCO MRN: [id number] DOB: 12-02-1934  MONSERATT LEDIN is a 88 y.o. year old female who sees Sparks, Leonie Douglas, MD for primary care. I spoke with  Gustavus Bryant by phone today.  What matters to the patients health and wellness today?  Continues to have some shortness of breath, cough, and hoarseness.  Determined not to be cardiac related.  CT tomorrow.    Goals Addressed             This Visit's Progress    management of chronic conditions, including HTN   On track    Care Coordination Interventions: Evaluation of current treatment plan related to hypertension self management and patient's adherence to plan as established by provider Provided education to patient re: stroke prevention, s/s of heart attack and stroke Reviewed medications with patient and discussed importance of compliance Discussed plans with patient for ongoing care management follow up and provided patient with direct contact information for care management team Reviewed scheduled/upcoming provider appointments including:  Discussed complications of poorly controlled blood pressure such as heart disease, stroke, circulatory complications, vision complications, kidney impairment, sexual dysfunction Appointments with pulmonary on 2/20 and PCP on 3/5.  Will have chest CT tomorrow to determine cause of inflammation (having cough and hoarseness).  Report echo results were "good" Monitors blood pressure, usually range 130-150s/70-80s         SDOH assessments and interventions completed:  No     Care Coordination Interventions:  Yes, provided   Follow up plan: Follow up call scheduled for 2/21    Encounter Outcome:  Pt. Visit Completed   Valente David, RN, MSN, [place] [facility] Care Management Coordinator [phone]

## 2022-11-04 ENCOUNTER — Ambulatory Visit
Admission: RE | Admit: 2022-11-04 | Discharge: 2022-11-04 | Disposition: A | Discharge status: Home / Self Care | Payer: PPO | Source: Ambulatory Visit | Attending: Specialist | Admitting: Specialist

## 2022-11-04 DIAGNOSIS — R918 Other nonspecific abnormal finding of lung field: Secondary | ICD-10-CM | POA: Diagnosis not present

## 2022-11-04 DIAGNOSIS — J90 Pleural effusion, not elsewhere classified: Secondary | ICD-10-CM | POA: Insufficient documentation

## 2022-11-04 DIAGNOSIS — R590 Localized enlarged lymph nodes: Secondary | ICD-10-CM | POA: Insufficient documentation

## 2022-11-04 MED ORDER — IOHEXOL 300 MG/ML  SOLN
75.0000 mL | Freq: Once | INTRAMUSCULAR | Status: AC | PRN
  Administered 2022-11-04: 75 mL via INTRAVENOUS

## 2022-12-06 DIAGNOSIS — H903 Sensorineural hearing loss, bilateral: Secondary | ICD-10-CM | POA: Diagnosis not present

## 2022-12-06 DIAGNOSIS — R490 Dysphonia: Secondary | ICD-10-CM | POA: Diagnosis not present

## 2022-12-27 ENCOUNTER — Ambulatory Visit: Payer: PPO | Admitting: Speech Pathology

## 2022-12-27 DIAGNOSIS — I2510 Atherosclerotic heart disease of native coronary artery without angina pectoris: Secondary | ICD-10-CM | POA: Diagnosis not present

## 2022-12-27 DIAGNOSIS — I25118 Atherosclerotic heart disease of native coronary artery with other forms of angina pectoris: Secondary | ICD-10-CM | POA: Diagnosis not present

## 2022-12-27 DIAGNOSIS — I10 Essential (primary) hypertension: Secondary | ICD-10-CM | POA: Diagnosis not present

## 2022-12-27 DIAGNOSIS — Z0000 Encounter for general adult medical examination without abnormal findings: Secondary | ICD-10-CM | POA: Diagnosis not present

## 2022-12-27 DIAGNOSIS — E782 Mixed hyperlipidemia: Secondary | ICD-10-CM | POA: Diagnosis not present

## 2022-12-27 DIAGNOSIS — G894 Chronic pain syndrome: Secondary | ICD-10-CM | POA: Diagnosis not present

## 2022-12-27 DIAGNOSIS — Z79899 Other long term (current) drug therapy: Secondary | ICD-10-CM | POA: Diagnosis not present

## 2022-12-27 DIAGNOSIS — R7303 Prediabetes: Secondary | ICD-10-CM | POA: Diagnosis not present

## 2022-12-27 DIAGNOSIS — R7309 Other abnormal glucose: Secondary | ICD-10-CM | POA: Diagnosis not present

## 2022-12-27 DIAGNOSIS — N1831 Chronic kidney disease, stage 3a: Secondary | ICD-10-CM | POA: Diagnosis not present

## 2022-12-27 DIAGNOSIS — I700 Atherosclerosis of aorta: Secondary | ICD-10-CM | POA: Diagnosis not present

## 2022-12-29 ENCOUNTER — Ambulatory Visit: Payer: PPO | Attending: Internal Medicine | Admitting: Speech Pathology

## 2022-12-29 DIAGNOSIS — R490 Dysphonia: Secondary | ICD-10-CM | POA: Diagnosis not present

## 2022-12-29 NOTE — Therapy (Signed)
OUTPATIENT SPEECH LANGUAGE PATHOLOGY VOICE EVALUATION   Patient Name: Kiara Perry MRN: [id number] DOB:[birth year], 88 y.o., female Today's Date: 12/29/2022  PCP: Fulton Reek, MD REFERRING PROVIDER: Beverly Gust   End of Session - 12/29/22 0917     Visit Number 1    Number of Visits 17    Date for SLP Re-Evaluation 02/23/23    Authorization Type [employer] P[employer]    Progress Note Due on Visit 10    SLP Start Time 0900    SLP Stop Time  1000    SLP Time Calculation (min) 60 min    Activity Tolerance Patient tolerated treatment well             Past Medical History:  Diagnosis Date   Anginal pain ([place])    Aortic atherosclerosis ([place])    Arthritis    Coronary artery disease    COVID-19 09/2019   GERD (gastroesophageal reflux disease)    Heart murmur    History of pulmonary embolus (PE)    Hyperlipidemia    Hypertension    Pneumonia    Wears dentures    full upper   Wears hearing aid in both ears    Past Surgical History:  Procedure Laterality Date   ABDOMINAL HYSTERECTOMY     APPENDECTOMY     BACK SURGERY     BREAST BIOPSY Left 1973   neg   BREAST CYST ASPIRATION Left 1970   neg   COLONOSCOPY     ESOPHAGOGASTRODUODENOSCOPY     EYE SURGERY     cateract   IVC FILTER INSERTION N/A 10/13/2020   Procedure: IVC FILTER INSERTION;  Surgeon: Katha Cabal, MD;  Location: [facility];  Service: Cardiovascular;  Laterality: N/A;   IVC FILTER REMOVAL N/A 03/30/2021   Procedure: IVC FILTER REMOVAL;  Surgeon: Katha Cabal, MD;  Location: [facility];  Service: Cardiovascular;  Laterality: N/A;   KNEE ARTHROPLASTY Left 01/11/2021   Procedure: COMPUTER ASSISTED TOTAL KNEE ARTHROPLASTY - RNFA;  Surgeon: Dereck Leep, MD;  Location: [facility];  Service: Orthopedics;  Laterality: Left;   MENISCUS REPAIR     right knee   ORIF TOE FRACTURE Right 09/23/2021   Procedure: OPEN REDUCTION INTERNAL FIXATION (ORIF) METATARSAL (TOE)  FRACTURE - 5TH;  Surgeon: Caroline More, DPM;  Location: [facility];  Service: Podiatry;  Laterality: Right;  nesthesia: Choice - pre-op pop saph/block   TONSILLECTOMY     Patient Active Problem List   Diagnosis Date Noted   Syncope 08/12/2022   UTI (urinary tract infection) 08/12/2022   Status post total left knee replacement 01/11/2021   History of pulmonary embolus (PE) 09/22/2020   Pain syndrome, chronic 06/24/2019   Hypertension 06/24/2019   Osteoarthritis 06/24/2019   Osteoporosis 06/24/2019   Venous ulcer of ankle, left ([place]) 06/17/2019   Lymphedema 04/21/2019   Venous insufficiency 04/21/2019   GERD (gastroesophageal reflux disease) 04/21/2019   Hyperlipidemia 04/21/2019   Edema of both legs 03/25/2019   Bradycardia 02/18/2019   Degenerative lumbar spinal stenosis 09/17/2018   Atherosclerosis of abdominal aorta ([place]) 07/27/2018   Coronary artery disease involving native coronary artery of native heart 07/27/2018   Coronary artery calcification 07/20/2018   Acute left-sided low back pain with left-sided sciatica 05/19/2017   Primary osteoarthritis of left knee 05/19/2017   SOBOE (shortness of breath on exertion) 12/08/2016    Onset date: 07/2022; date of referral 12/07/2022  REFERRING DIAG: Dysphonia (R49.0)  THERAPY DIAG:  Dysphonia  Rationale for Evaluation and Treatment Rehabilitation  SUBJECTIVE:   SUBJECTIVE STATEMENT: Pt pleasant, conversant, excellent historian Pt accompanied by: self  PERTINENT HISTORY: Pt is an 88 year old female with history of adenoidectomy and tonsillectomy,   DIAGNOSTIC FINDINGS:  Laryngoscopy on 12/06/2022 revealed good vocal cord mobility, mild edema, using vocal cords to speak.   PAIN:  Are you having pain? No   FALLS: Has patient fallen in last 6 months? No, Number of falls: N/A  LIVING ENVIRONMENT: Lives with: lives with their spouse Lives in: House/apartment  PLOF: Independent  PATIENT GOALS to have a  better sounding voice  OBJECTIVE:   COGNITION: Overall cognitive status: Within functional limits for tasks assessed  SOCIAL HISTORY: Occupation: retired Building control surveyor intake: suboptimal Caffeine/alcohol intake: moderate Daily voice use: moderate Environmental risks: None reported Occupational risks: None identified Misuse: Excessively low pitch, Strain, and Tension Phonotraumatic behaviors: Excessive voice use during colds/illnesses and Excessive and/or habitual throat clearing  PERCEPTUAL VOICE ASSESSMENT: Voice quality: hoarse, breathy, rough, strained, low vocal intensity, and vocal fatigue Vocal abuse: habitual throat clearing Resonance: normal Respiratory function: thoracic breathing  OBJECTIVE VOICE ASSESSMENT: Sustained "ah" maximum phonation time: 2.1 seconds Sustained "ah" loudness average: 76 dB Average fundamental frequency during sustained "ah": 204.5 Hz   (1.5 SD below average of  244 Hz +/- 27 for gender)  Oral reading (passage) loudness average: 80 dB Oral reading loudness range: 13 dB Conversational pitch average: 181 Hz Highest dynamic pitch in conversational speech: 480 Hz Lowest dynamic pitch in conversational speech: 85 Hz Conversational pitch range: 396 Hz Conversational loudness average: 80 dB Conversational loudness range: 13 dB S/z ratio: .4 (Suggestive of dysfunction >1.0) Voice quality: hoarse, breathy, harsh, rough, strained, and diplophonia     ORAL MOTOR EXAMINATION Facial : WFL Lingual: WFL Velum: WFL Mandible: WFL Cough: WFL Voice: Hoarse, Strained, Breathy    PATIENT REPORTED OUTCOME MEASURES (PROM):  VOICE HANDICAP INDEX (VHI)  The Voice Handicap Index is comprised of a series of questions to assess the patient's perception of their voice. It is designed to evaluate the emotional, physical and functional components of the voice problem.  Functional: 1 Physical: 8 Emotional: 2 Total: 11 (Normal mean 8.75, SD =14.97)  z score =  .15   no significant impact = 0-1.00,   TODAY'S TREATMENT:  N/A   PATIENT EDUCATION: Education details: Results of this assessment, ST POC Person educated: Patient Education method: Explanation Education comprehension: verbalized understanding   HOME EXERCISE PROGRAM: N/A     GOALS: Goals reviewed with patient? Yes  SHORT TERM GOALS: Target date: 10 sessions   The patient will demonstrate abdominal breathing patterns and steady release of breath on exhalation to optimize efficiency of voicing and decrease laryngeal hyperfunction. Baseline: Goal status: INITIAL  2.  The patient will increase hydration for an eventual goal of 6-8 glasses per day and limit caffeine intake (to maximum of 1-2, 8 oz cups/day), as measured by patient report. Baseline:  Goal status: INITIAL  3.  The patient will eliminate phonotraumatic behaviors such as chronic throat clearing, by substituting non-traumatic methods to clear mucus. Baseline:  Goal status: INITIAL  4.  The patient will decrease laryngeal and articulatory muscle tension by independently completing relaxation/stretching exercises. Baseline:  Goal status: INITIAL  5.  The patient will utilize a forward tone focused/resonant voice to decrease vocal hyperfunction and improve voice quality and vocal projection. Baseline:  Goal status: INITIAL  LONG TERM GOALS: Target date: 02/23/2023   The patient  will be independent for abdominal breathing and breath support exercises. Baseline:  Goal status: INITIAL  2.  The patient will demonstrate independent understanding of vocal hygiene concepts. Baseline:  Goal status: INITIAL  3.   The patient will maintain relaxed phonation for paragraph length recitation with 80% accuracy. Baseline:  Goal status: INITIAL  ASSESSMENT:  CLINICAL IMPRESSION: Patient is a 88 y.o. female who was seen today for qualitive voice evaluation. She present with mild to moderate dysphonia that is c/b low pitch,  course, gravely vocal quality.   OBJECTIVE IMPAIRMENTS include voice disorder. These impairments are limiting patient from effectively communicating at home and in community. Factors affecting potential to achieve goals and functional outcome are previous level of function.. Patient will benefit from skilled SLP services to address above impairments and improve overall function.  REHAB POTENTIAL: Good  PLAN: SLP FREQUENCY: 1-2x/week  SLP DURATION: 8 weeks  PLANNED INTERVENTIONS: SLP instruction and feedback and Patient/family education  Grier Vu B. Rutherford Nail, M.S., CCC-SLP, Mining engineer Certified Brain Injury [place]  [place] Office [phone] Ascom [phone] Fax [phone]

## 2023-01-03 ENCOUNTER — Ambulatory Visit: Payer: PPO | Admitting: Speech Pathology

## 2023-01-03 DIAGNOSIS — R490 Dysphonia: Secondary | ICD-10-CM | POA: Diagnosis not present

## 2023-01-03 NOTE — Therapy (Unsigned)
OUTPATIENT SPEECH LANGUAGE PATHOLOGY TREATMENT NOTE   Patient Name: Kiara Perry MRN: [id number] DOB:11-13-1934, 88 y.o., female Today's Date: 01/03/2023  PCP: Fulton Reek, MD REFERRING PROVIDER: Beverly Gust, MD  END OF SESSION:   End of Session - 01/03/23 1503     Visit Number 2    Number of Visits 17    Date for SLP Re-Evaluation 02/23/23    Authorization Type [employer] P[employer]    Progress Note Due on Visit 10    SLP Start Time 1000    SLP Stop Time  1100    SLP Time Calculation (min) 60 min    Activity Tolerance Patient tolerated treatment well             Past Medical History:  Diagnosis Date   Anginal pain ([place])    Aortic atherosclerosis ([place])    Arthritis    Coronary artery disease    COVID-19 09/2019   GERD (gastroesophageal reflux disease)    Heart murmur    History of pulmonary embolus (PE)    Hyperlipidemia    Hypertension    Pneumonia    Wears dentures    full upper   Wears hearing aid in both ears    Past Surgical History:  Procedure Laterality Date   ABDOMINAL HYSTERECTOMY     APPENDECTOMY     BACK SURGERY     BREAST BIOPSY Left 1973   neg   BREAST CYST ASPIRATION Left 1970   neg   COLONOSCOPY     ESOPHAGOGASTRODUODENOSCOPY     EYE SURGERY     cateract   IVC FILTER INSERTION N/A 10/13/2020   Procedure: IVC FILTER INSERTION;  Surgeon: Katha Cabal, MD;  Location: [facility];  Service: Cardiovascular;  Laterality: N/A;   IVC FILTER REMOVAL N/A 03/30/2021   Procedure: IVC FILTER REMOVAL;  Surgeon: Katha Cabal, MD;  Location: [facility];  Service: Cardiovascular;  Laterality: N/A;   KNEE ARTHROPLASTY Left 01/11/2021   Procedure: COMPUTER ASSISTED TOTAL KNEE ARTHROPLASTY - RNFA;  Surgeon: Dereck Leep, MD;  Location: [facility];  Service: Orthopedics;  Laterality: Left;   MENISCUS REPAIR     right knee   ORIF TOE FRACTURE Right 09/23/2021   Procedure: OPEN REDUCTION INTERNAL FIXATION (ORIF)  METATARSAL (TOE) FRACTURE - 5TH;  Surgeon: Caroline More, DPM;  Location: [facility];  Service: Podiatry;  Laterality: Right;  nesthesia: Choice - pre-op pop saph/block   TONSILLECTOMY     Patient Active Problem List   Diagnosis Date Noted   Syncope 08/12/2022   UTI (urinary tract infection) 08/12/2022   Status post total left knee replacement 01/11/2021   History of pulmonary embolus (PE) 09/22/2020   Pain syndrome, chronic 06/24/2019   Hypertension 06/24/2019   Osteoarthritis 06/24/2019   Osteoporosis 06/24/2019   Venous ulcer of ankle, left ([place]) 06/17/2019   Lymphedema 04/21/2019   Venous insufficiency 04/21/2019   GERD (gastroesophageal reflux disease) 04/21/2019   Hyperlipidemia 04/21/2019   Edema of both legs 03/25/2019   Bradycardia 02/18/2019   Degenerative lumbar spinal stenosis 09/17/2018   Atherosclerosis of abdominal aorta ([place]) 07/27/2018   Coronary artery disease involving native coronary artery of native heart 07/27/2018   Coronary artery calcification 07/20/2018   Acute left-sided low back pain with left-sided sciatica 05/19/2017   Primary osteoarthritis of left knee 05/19/2017   SOBOE (shortness of breath on exertion) 12/08/2016    Onset date: 07/2022; date of referral 12/07/2022   REFERRING DIAG:  Dysphonia (R49.0)   PERTINENT HISTORY: Pt is an 88 year old female with history of adenoidectomy and tonsillectomy,    DIAGNOSTIC FINDINGS:  Laryngoscopy on 12/06/2022 revealed good vocal cord mobility, mild edema, using vocal cords to speak.   THERAPY DIAG:  Dysphonia  Rationale for Evaluation and Treatment Rehabilitation  SUBJECTIVE: Pleasant, eager  Pt accompanied by: self  PAIN:  Are you having pain? No  PATIENT GOALS: to have a better sounding voice  OBJECTIVE:   TODAY'S TREATMENT: Skilled treatment session focused on pt's dysphonia goals. SLP facilitated session by providing the following skilled interventions:  Verbal, tactile and  written instruction provided in neck stretches included head turn, head tilt left/right and head tilt up/down.   SLP further facilitated session by providing maximal multimodal assistance for abdominal breathing. Pt very motivated but struggled with producing as she has very prominent clavicular breathing pattern.      Information provided on the 3 parts of voice production.   PATIENT EDUCATION: Education details: see above Person educated: Patient Education method: Consulting civil engineer, Media planner, Verbal cues, and Handouts Education comprehension: needs further education  HOME EXERCISE PROGRAM:    GOALS: Goals reviewed with patient? Yes   SHORT TERM GOALS: Target date: 10 sessions    The patient will demonstrate abdominal breathing patterns and steady release of breath on exhalation to optimize efficiency of voicing and decrease laryngeal hyperfunction. Baseline: Goal status: INITIAL   2.  The patient will increase hydration for an eventual goal of 6-8 glasses per day and limit caffeine intake (to maximum of 1-2, 8 oz cups/day), as measured by patient report. Baseline:  Goal status: INITIAL   3.  The patient will eliminate phonotraumatic behaviors such as chronic throat clearing, by substituting non-traumatic methods to clear mucus. Baseline:  Goal status: INITIAL   4.  The patient will decrease laryngeal and articulatory muscle tension by independently completing relaxation/stretching exercises. Baseline:  Goal status: INITIAL   5.  The patient will utilize a forward tone focused/resonant voice to decrease vocal hyperfunction and improve voice quality and vocal projection. Baseline:  Goal status: INITIAL   LONG TERM GOALS: Target date: 02/23/2023    The patient will be independent for abdominal breathing and breath support exercises. Baseline:  Goal status: INITIAL   2.  The patient will demonstrate independent understanding of vocal hygiene concepts. Baseline:  Goal  status: INITIAL   3.   The patient will maintain relaxed phonation for paragraph length recitation with 80% accuracy. Baseline:  Goal status: INITIAL  ASSESSMENT:  CLINICAL IMPRESSION: Pt presents with moderate dysphonia c/b reduce breath support, speaking on residual volume, strain.   OBJECTIVE IMPAIRMENTS include voice disorder. These impairments are limiting patient from effectively communicating at home and in community. Factors affecting potential to achieve goals and functional outcome are previous level of function.. Patient will benefit from skilled SLP services to address above impairments and improve overall function.  REHAB POTENTIAL: Good  PLAN: SLP FREQUENCY: 1-2x/week  SLP DURATION: 8 weeks  PLANNED INTERVENTIONS: SLP instruction and feedback and Patient/family education  Beulah Matusek B. Rutherford Nail, M.S., CCC-SLP, Mining engineer Certified Brain Injury [place]  [place] Office [phone] Ascom [phone] Fax [phone]

## 2023-01-06 ENCOUNTER — Ambulatory Visit: Payer: PPO | Admitting: Speech Pathology

## 2023-01-06 DIAGNOSIS — R490 Dysphonia: Secondary | ICD-10-CM | POA: Diagnosis not present

## 2023-01-06 NOTE — Therapy (Signed)
OUTPATIENT SPEECH LANGUAGE PATHOLOGY TREATMENT NOTE   Patient Name: Kiara Perry MRN: [id number] DOB:08/24/1935, 88 y.o., female Today's Date: 01/06/2023  PCP: Fulton Reek, MD REFERRING PROVIDER: Beverly Gust, MD  END OF SESSION:   End of Session - 01/06/23 0844     Visit Number 4    Number of Visits 17    Date for SLP Re-Evaluation 02/23/23    Authorization Type [employer] P[employer]    Progress Note Due on Visit 10    SLP Start Time 0800    SLP Stop Time  0900    SLP Time Calculation (min) 60 min    Activity Tolerance Patient tolerated treatment well             Past Medical History:  Diagnosis Date   Anginal pain ([place])    Aortic atherosclerosis ([place])    Arthritis    Coronary artery disease    COVID-19 09/2019   GERD (gastroesophageal reflux disease)    Heart murmur    History of pulmonary embolus (PE)    Hyperlipidemia    Hypertension    Pneumonia    Wears dentures    full upper   Wears hearing aid in both ears    Past Surgical History:  Procedure Laterality Date   ABDOMINAL HYSTERECTOMY     APPENDECTOMY     BACK SURGERY     BREAST BIOPSY Left 1973   neg   BREAST CYST ASPIRATION Left 1970   neg   COLONOSCOPY     ESOPHAGOGASTRODUODENOSCOPY     EYE SURGERY     cateract   IVC FILTER INSERTION N/A 10/13/2020   Procedure: IVC FILTER INSERTION;  Surgeon: Katha Cabal, MD;  Location: [facility];  Service: Cardiovascular;  Laterality: N/A;   IVC FILTER REMOVAL N/A 03/30/2021   Procedure: IVC FILTER REMOVAL;  Surgeon: Katha Cabal, MD;  Location: [facility];  Service: Cardiovascular;  Laterality: N/A;   KNEE ARTHROPLASTY Left 01/11/2021   Procedure: COMPUTER ASSISTED TOTAL KNEE ARTHROPLASTY - RNFA;  Surgeon: Dereck Leep, MD;  Location: [facility];  Service: Orthopedics;  Laterality: Left;   MENISCUS REPAIR     right knee   ORIF TOE FRACTURE Right 09/23/2021   Procedure: OPEN REDUCTION INTERNAL FIXATION (ORIF)  METATARSAL (TOE) FRACTURE - 5TH;  Surgeon: Caroline More, DPM;  Location: [facility];  Service: Podiatry;  Laterality: Right;  nesthesia: Choice - pre-op pop saph/block   TONSILLECTOMY     Patient Active Problem List   Diagnosis Date Noted   Syncope 08/12/2022   UTI (urinary tract infection) 08/12/2022   Status post total left knee replacement 01/11/2021   History of pulmonary embolus (PE) 09/22/2020   Pain syndrome, chronic 06/24/2019   Hypertension 06/24/2019   Osteoarthritis 06/24/2019   Osteoporosis 06/24/2019   Venous ulcer of ankle, left ([place]) 06/17/2019   Lymphedema 04/21/2019   Venous insufficiency 04/21/2019   GERD (gastroesophageal reflux disease) 04/21/2019   Hyperlipidemia 04/21/2019   Edema of both legs 03/25/2019   Bradycardia 02/18/2019   Degenerative lumbar spinal stenosis 09/17/2018   Atherosclerosis of abdominal aorta ([place]) 07/27/2018   Coronary artery disease involving native coronary artery of native heart 07/27/2018   Coronary artery calcification 07/20/2018   Acute left-sided low back pain with left-sided sciatica 05/19/2017   Primary osteoarthritis of left knee 05/19/2017   SOBOE (shortness of breath on exertion) 12/08/2016    Onset date: 07/2022; date of referral 12/07/2022   REFERRING DIAG:  Dysphonia (R49.0)   PERTINENT HISTORY: Pt is an 88 year old female with history of adenoidectomy and tonsillectomy,    DIAGNOSTIC FINDINGS:  Laryngoscopy on 12/06/2022 revealed good vocal cord mobility, mild edema, using vocal cords to speak.   THERAPY DIAG:  Dysphonia  Rationale for Evaluation and Treatment Rehabilitation  SUBJECTIVE: Pt appeared in a good mood, reported she was sore from practicing the exercises given for homework.   Pt accompanied by: self  PAIN:  Are you having pain? No  PATIENT GOALS: to have a better sounding voice  OBJECTIVE:   TODAY'S TREATMENT: Skilled treatment session focused on pt's dysphonia goals. SLP  facilitated session by providing the following skilled interventions:  SLP led pt in neck stretches included head turn, head tilt left/right and head tilt up/down to promote relaxation. Pt required moderate cues to relax shoulders, fading to minimal cues. Pt observed self-correcting shoulder tension.     Patient instructed in flow phonation through Skill level 1: Establish Airflow Release: Unarticulated: Maximal multi-modal assistance required to promote relaxed release of airflow; Articulated (unvoiced): maximal multi-modal assistance for exaggerated mouth movements and relaxed release of airflow. Pt presented with a more tense posture, SLP facilitated further relaxation by having pt complete neck exercises.   During conversational speech pts voice was noted to become gravely over time with continued use.    Pt unable to complete voiced prolonged /m/ and /v/ phonemes. To further facilitate pt needs, SLP switched to exaggerated voiceless /f/ phoneme with a cue of pulling lip under teeth. Pt was able to stay relaxed and maintain a natural posture.   PATIENT EDUCATION: Education details: see above Person educated: Patient Education method: Education officer, environmental, Verbal cues, and Handouts Education comprehension: needs further education  HOME EXERCISE PROGRAM:  Practice abdominal breathing, neck/shoulder stretches  GOALS: Goals reviewed with patient? Yes   SHORT TERM GOALS: Target date: 10 sessions    The patient will demonstrate abdominal breathing patterns and steady release of breath on exhalation to optimize efficiency of voicing and decrease laryngeal hyperfunction. Baseline: Goal status: INITIAL   2.  The patient will increase hydration for an eventual goal of 6-8 glasses per day and limit caffeine intake (to maximum of 1-2, 8 oz cups/day), as measured by patient report. Baseline:  Goal status: INITIAL   3.  The patient will eliminate phonotraumatic behaviors such as chronic  throat clearing, by substituting non-traumatic methods to clear mucus. Baseline:  Goal status: INITIAL   4.  The patient will decrease laryngeal and articulatory muscle tension by independently completing relaxation/stretching exercises. Baseline:  Goal status: INITIAL   5.  The patient will utilize a forward tone focused/resonant voice to decrease vocal hyperfunction and improve voice quality and vocal projection. Baseline:  Goal status: INITIAL   LONG TERM GOALS: Target date: 02/23/2023    The patient will be independent for abdominal breathing and breath support exercises. Baseline:  Goal status: INITIAL   2.  The patient will demonstrate independent understanding of vocal hygiene concepts. Baseline:  Goal status: INITIAL   3.   The patient will maintain relaxed phonation for paragraph length recitation with 80% accuracy. Baseline:  Goal status: INITIAL  ASSESSMENT:  CLINICAL IMPRESSION: Pt presents with moderate dysphonia c/b reduce breath support, speaking on residual volume, strain. Pt presents with good prognosis as pt is motivated and willing to practice outside of therapy room. Pt demonstrates relaxation needed to promote behavioral changes seen in voice therapy.   OBJECTIVE IMPAIRMENTS include voice disorder. These impairments  are limiting patient from effectively communicating at home and in community. Factors affecting potential to achieve goals and functional outcome are previous level of function.. Patient will benefit from skilled SLP services to address above impairments and improve overall function.  REHAB POTENTIAL: Good  PLAN: SLP FREQUENCY: 1-2x/week  SLP DURATION: 8 weeks  PLANNED INTERVENTIONS: SLP instruction and feedback and Patient/family education Alphonzo Grieve, SLP Graduate Clinician    Happi B. Rutherford Nail, M.S., CCC-SLP, Mining engineer Certified Brain Injury [place]  [place] Office [phone] Ascom [phone] Fax [phone]

## 2023-01-09 ENCOUNTER — Ambulatory Visit: Payer: PPO | Admitting: Speech Pathology

## 2023-01-09 DIAGNOSIS — R490 Dysphonia: Secondary | ICD-10-CM

## 2023-01-09 NOTE — Therapy (Signed)
OUTPATIENT SPEECH LANGUAGE PATHOLOGY TREATMENT NOTE   Patient Name: Kiara Perry MRN: [id number] DOB:04-Nov-1934, 88 y.o., female Today's Date: 01/09/2023  PCP: Fulton Reek, MD REFERRING PROVIDER: Beverly Gust, MD  END OF SESSION:   End of Session - 01/09/23 0846     Visit Number 5    Number of Visits 17    Date for SLP Re-Evaluation 02/23/23    Authorization Type [employer]    Progress Note Due on Visit 10    SLP Start Time 0800    SLP Stop Time  0845    SLP Time Calculation (min) 45 min    Activity Tolerance Patient tolerated treatment well             Past Medical History:  Diagnosis Date   Anginal pain ([place])    Aortic atherosclerosis ([place])    Arthritis    Coronary artery disease    COVID-19 09/2019   GERD (gastroesophageal reflux disease)    Heart murmur    History of pulmonary embolus (PE)    Hyperlipidemia    Hypertension    Pneumonia    Wears dentures    full upper   Wears hearing aid in both ears    Past Surgical History:  Procedure Laterality Date   ABDOMINAL HYSTERECTOMY     APPENDECTOMY     BACK SURGERY     BREAST BIOPSY Left 1973   neg   BREAST CYST ASPIRATION Left 1970   neg   COLONOSCOPY     ESOPHAGOGASTRODUODENOSCOPY     EYE SURGERY     cateract   IVC FILTER INSERTION N/A 10/13/2020   Procedure: IVC FILTER INSERTION;  Surgeon: Katha Cabal, MD;  Location: [facility];  Service: Cardiovascular;  Laterality: N/A;   IVC FILTER REMOVAL N/A 03/30/2021   Procedure: IVC FILTER REMOVAL;  Surgeon: Katha Cabal, MD;  Location: [facility];  Service: Cardiovascular;  Laterality: N/A;   KNEE ARTHROPLASTY Left 01/11/2021   Procedure: COMPUTER ASSISTED TOTAL KNEE ARTHROPLASTY - RNFA;  Surgeon: Dereck Leep, MD;  Location: [facility];  Service: Orthopedics;  Laterality: Left;   MENISCUS REPAIR     right knee   ORIF TOE FRACTURE Right 09/23/2021   Procedure: OPEN REDUCTION INTERNAL FIXATION (ORIF)  METATARSAL (TOE) FRACTURE - 5TH;  Surgeon: Caroline More, DPM;  Location: [facility];  Service: Podiatry;  Laterality: Right;  nesthesia: Choice - pre-op pop saph/block   TONSILLECTOMY     Patient Active Problem List   Diagnosis Date Noted   Syncope 08/12/2022   UTI (urinary tract infection) 08/12/2022   Status post total left knee replacement 01/11/2021   History of pulmonary embolus (PE) 09/22/2020   Pain syndrome, chronic 06/24/2019   Hypertension 06/24/2019   Osteoarthritis 06/24/2019   Osteoporosis 06/24/2019   Venous ulcer of ankle, left ([place]) 06/17/2019   Lymphedema 04/21/2019   Venous insufficiency 04/21/2019   GERD (gastroesophageal reflux disease) 04/21/2019   Hyperlipidemia 04/21/2019   Edema of both legs 03/25/2019   Bradycardia 02/18/2019   Degenerative lumbar spinal stenosis 09/17/2018   Atherosclerosis of abdominal aorta ([place]) 07/27/2018   Coronary artery disease involving native coronary artery of native heart 07/27/2018   Coronary artery calcification 07/20/2018   Acute left-sided low back pain with left-sided sciatica 05/19/2017   Primary osteoarthritis of left knee 05/19/2017   SOBOE (shortness of breath on exertion) 12/08/2016    Onset date: 07/2022; date of referral 12/07/2022   REFERRING DIAG:  Dysphonia (R49.0)   PERTINENT HISTORY: Pt is an 88 year old female with history of adenoidectomy and tonsillectomy,    DIAGNOSTIC FINDINGS:  Laryngoscopy on 12/06/2022 revealed good vocal cord mobility, mild edema, using vocal cords to speak.   THERAPY DIAG:  Dysphonia  Rationale for Evaluation and Treatment Rehabilitation  SUBJECTIVE: Pt appeared in a good mood, reported soreness from exercises has improved.   Pt accompanied by: self  PAIN:  Are you having pain? No  PATIENT GOALS: to have a better sounding voice  OBJECTIVE:   TODAY'S TREATMENT: Skilled treatment session focused on pt's dysphonia goals. SLP facilitated session by providing  the following skilled interventions:   SLP led pt in neck stretches included head turn, head tilt left/right and head tilt up/down to promote relaxation. Pt required minimal cues to relax shoulders. Pt observed with very minimal neck tension during neck stretches.  Pt tasked to heavy sigh with voice, adding in phonemes /a/, /I/, /u/. Pt required max assist to monitor for tension and a gravely voice. Pt demonstrated difficulty with conditioning to the task.     Pt unable to complete voiced prolonged /m/ and /v/ phonemes without tension. Pt required multimodal cues to make /m/ phoneme in isolation with little tension, requiring cues to keep strong breath support and monitor for tension.  Pt unable to make /v/ phoneme in isolation when given multimodal cues, pt noted to have increase in tension.  SLP had pt identify relaxed vs tension in voicing, pt demonstrated adequate understanding. PT instructed to practice self monitoring when voice gets tense.  Pt noted to be more gravely/strained when lacking adequate breath support.  PT struggled with understanding the steps/directions required to complete each task completed today.   PATIENT EDUCATION: Education details: see above Person educated: Patient Education method: Education officer, environmental, Verbal cues, and Handouts Education comprehension: needs further education  HOME EXERCISE PROGRAM:  Practice abdominal breathing, neck/shoulder stretches, heavy relaxed sigh self monitoring for tension   GOALS: Goals reviewed with patient? Yes   SHORT TERM GOALS: Target date: 10 sessions    The patient will demonstrate abdominal breathing patterns and steady release of breath on exhalation to optimize efficiency of voicing and decrease laryngeal hyperfunction. Baseline: Goal status: INITIAL   2.  The patient will increase hydration for an eventual goal of 6-8 glasses per day and limit caffeine intake (to maximum of 1-2, 8 oz cups/day), as measured  by patient report. Baseline:  Goal status: INITIAL   3.  The patient will eliminate phonotraumatic behaviors such as chronic throat clearing, by substituting non-traumatic methods to clear mucus. Baseline:  Goal status: INITIAL   4.  The patient will decrease laryngeal and articulatory muscle tension by independently completing relaxation/stretching exercises. Baseline:  Goal status: INITIAL   5.  The patient will utilize a forward tone focused/resonant voice to decrease vocal hyperfunction and improve voice quality and vocal projection. Baseline:  Goal status: INITIAL   LONG TERM GOALS: Target date: 02/23/2023    The patient will be independent for abdominal breathing and breath support exercises. Baseline:  Goal status: INITIAL   2.  The patient will demonstrate independent understanding of vocal hygiene concepts. Baseline:  Goal status: INITIAL   3.   The patient will maintain relaxed phonation for paragraph length recitation with 80% accuracy. Baseline:  Goal status: INITIAL  ASSESSMENT:  CLINICAL IMPRESSION: Pt presents with moderate dysphonia c/b reduce breath support, speaking on residual volume, strain. Pt presents with good prognosis as pt is  motivated and willing to practice outside of therapy room. Pt demonstrates relaxation needed to promote behavioral changes seen in voice therapy. Pt struggles with conditioning to task which is limiting the pt from achieving goals in therapy.   OBJECTIVE IMPAIRMENTS include voice disorder. These impairments are limiting patient from effectively communicating at home and in community. Factors affecting potential to achieve goals and functional outcome are previous level of function.. Patient will benefit from skilled SLP services to address above impairments and improve overall function.  REHAB POTENTIAL: Good  PLAN: SLP FREQUENCY: 1-2x/week  SLP DURATION: 8 weeks  PLANNED INTERVENTIONS: SLP instruction and feedback and  Patient/family education Alphonzo Grieve, SLP Graduate Clinician    Happi B. Rutherford Nail, M.S., CCC-SLP, Mining engineer Certified Brain Injury [place]  [place] Office [phone] Ascom [phone] Fax [phone]

## 2023-01-11 ENCOUNTER — Ambulatory Visit: Payer: PPO | Admitting: Speech Pathology

## 2023-01-11 DIAGNOSIS — R490 Dysphonia: Secondary | ICD-10-CM

## 2023-01-11 NOTE — Therapy (Signed)
OUTPATIENT SPEECH LANGUAGE PATHOLOGY TREATMENT NOTE   Patient Name: Kiara Perry MRN: [id number] DOB:July 09, 1935, 88 y.o., female Today's Date: 01/11/2023  PCP: Fulton Reek, MD REFERRING PROVIDER: Beverly Gust, MD  END OF SESSION:   End of Session - 01/11/23 0804     Visit Number 6    Number of Visits 17    Date for SLP Re-Evaluation 02/23/23    Authorization Type [employer] P[employer]    Progress Note Due on Visit 10    SLP Start Time 0800    SLP Stop Time  0900    SLP Time Calculation (min) 60 min    Activity Tolerance Patient tolerated treatment well             Past Medical History:  Diagnosis Date   Anginal pain ([place])    Aortic atherosclerosis ([place])    Arthritis    Coronary artery disease    COVID-19 09/2019   GERD (gastroesophageal reflux disease)    Heart murmur    History of pulmonary embolus (PE)    Hyperlipidemia    Hypertension    Pneumonia    Wears dentures    full upper   Wears hearing aid in both ears    Past Surgical History:  Procedure Laterality Date   ABDOMINAL HYSTERECTOMY     APPENDECTOMY     BACK SURGERY     BREAST BIOPSY Left 1973   neg   BREAST CYST ASPIRATION Left 1970   neg   COLONOSCOPY     ESOPHAGOGASTRODUODENOSCOPY     EYE SURGERY     cateract   IVC FILTER INSERTION N/A 10/13/2020   Procedure: IVC FILTER INSERTION;  Surgeon: Katha Cabal, MD;  Location: [place] [facility];  Service: Cardiovascular;  Laterality: N/A;   IVC FILTER REMOVAL N/A 03/30/2021   Procedure: IVC FILTER REMOVAL;  Surgeon: Katha Cabal, MD;  Location: [place] [facility];  Service: Cardiovascular;  Laterality: N/A;   KNEE ARTHROPLASTY Left 01/11/2021   Procedure: COMPUTER ASSISTED TOTAL KNEE ARTHROPLASTY - RNFA;  Surgeon: Dereck Leep, MD;  Location: [facility];  Service: Orthopedics;  Laterality: Left;   MENISCUS REPAIR     right knee   ORIF TOE FRACTURE Right 09/23/2021   Procedure: OPEN REDUCTION INTERNAL FIXATION (ORIF)  METATARSAL (TOE) FRACTURE - 5TH;  Surgeon: Caroline More, DPM;  Location: [facility];  Service: Podiatry;  Laterality: Right;  nesthesia: Choice - pre-op pop saph/block   TONSILLECTOMY     Patient Active Problem List   Diagnosis Date Noted   Syncope 08/12/2022   UTI (urinary tract infection) 08/12/2022   Status post total left knee replacement 01/11/2021   History of pulmonary embolus (PE) 09/22/2020   Pain syndrome, chronic 06/24/2019   Hypertension 06/24/2019   Osteoarthritis 06/24/2019   Osteoporosis 06/24/2019   Venous ulcer of ankle, left ([place]) 06/17/2019   Lymphedema 04/21/2019   Venous insufficiency 04/21/2019   GERD (gastroesophageal reflux disease) 04/21/2019   Hyperlipidemia 04/21/2019   Edema of both legs 03/25/2019   Bradycardia 02/18/2019   Degenerative lumbar spinal stenosis 09/17/2018   Atherosclerosis of abdominal aorta ([place]) 07/27/2018   Coronary artery disease involving native coronary artery of native heart 07/27/2018   Coronary artery calcification 07/20/2018   Acute left-sided low back pain with left-sided sciatica 05/19/2017   Primary osteoarthritis of left knee 05/19/2017   SOBOE (shortness of breath on exertion) 12/08/2016    Onset date: 07/2022; date of referral 12/07/2022   REFERRING DIAG:  Dysphonia (R49.0)   PERTINENT HISTORY: Pt is an 88 year old female with history of adenoidectomy and tonsillectomy,    DIAGNOSTIC FINDINGS:  Laryngoscopy on 12/06/2022 revealed good vocal cord mobility, mild edema, using vocal cords to speak.   THERAPY DIAG:  Dysphonia  Rationale for Evaluation and Treatment Rehabilitation  SUBJECTIVE: Pt reported a better sounding voice and use of exercises, motivated to target tense voice while on the phone.   Pt accompanied by: self  PAIN:  Are you having pain? No  PATIENT GOALS: to have a better sounding voice  OBJECTIVE:   TODAY'S TREATMENT: Skilled treatment session focused on pt's dysphonia goals. SLP  facilitated session by providing the following skilled interventions:   SLP led pt in neck stretches included head turn, head tilt left/right and head tilt up/down to promote relaxation. SLP instructed pt to deepen stretches as pt was independent in maintaining relaxed shoulders and was observed with minimal tension during stretches.   Pt reports that while voice is getting better, sustaining a good, strong voice while on the phone continues to be a problem as pt gets hoarse quickly. To target phone use, SLP facilitated pt in the following exercises to warm up her voice to prepare for answering the phone.   Pt instructed to hum a tune (hymn) to strengthen vocal quality. Pt was unable to complete this task as pt lack the adequate breath support to maintain a smooth vocal quality, pt was observed to have a harsh, gravely, tense voice during each trial of humming.    Pt tasked to heavy sigh with voice, leading in to a voiced phoneme /a/. Pt demonstrated difficulty with maintaining breath support for smooth voicing, pt observed to have a tense, gravely voice.   To better facilitate easy onset voicing, SLP instructed pt to whistle (blow air out as pt unable to whistle) leading into a voice phoneme /u/. Pt maintained a harsh, gravely vocal quality.   SLP had pt identify relaxed vs tension in voicing, pt demonstrated adequate understanding.   SLP instructed pt to easy count to three (1-2-3-hello) before answering the telephone to warm up her voice. Pt observed with good, relaxed vocal quality.     PATIENT EDUCATION: Education details: see above Person educated: Patient Education method: Education officer, environmental, Verbal cues, and Handouts Education comprehension: needs further education  HOME EXERCISE PROGRAM:  Practice abdominal breathing, neck/shoulder stretches, heavy relaxed sigh self monitoring for tension   Warm up before answering phone (1-2-3-hello)   GOALS: Goals reviewed with  patient? Yes   SHORT TERM GOALS: Target date: 10 sessions    The patient will demonstrate abdominal breathing patterns and steady release of breath on exhalation to optimize efficiency of voicing and decrease laryngeal hyperfunction. Baseline: Goal status: INITIAL   2.  The patient will increase hydration for an eventual goal of 6-8 glasses per day and limit caffeine intake (to maximum of 1-2, 8 oz cups/day), as measured by patient report. Baseline:  Goal status: INITIAL   3.  The patient will eliminate phonotraumatic behaviors such as chronic throat clearing, by substituting non-traumatic methods to clear mucus. Baseline:  Goal status: INITIAL   4.  The patient will decrease laryngeal and articulatory muscle tension by independently completing relaxation/stretching exercises. Baseline:  Goal status: INITIAL   5.  The patient will utilize a forward tone focused/resonant voice to decrease vocal hyperfunction and improve voice quality and vocal projection. Baseline:  Goal status: INITIAL   LONG TERM GOALS: Target date: 02/23/2023  The patient will be independent for abdominal breathing and breath support exercises. Baseline:  Goal status: INITIAL   2.  The patient will demonstrate independent understanding of vocal hygiene concepts. Baseline:  Goal status: INITIAL   3.   The patient will maintain relaxed phonation for paragraph length recitation with 80% accuracy. Baseline:  Goal status: INITIAL  ASSESSMENT:  CLINICAL IMPRESSION: Pt presents with moderate dysphonia c/b reduce breath support, speaking on residual volume, strain. Pt presents with good prognosis as pt is motivated and willing to practice outside of therapy room. Pt demonstrates relaxation needed to promote behavioral changes seen in voice therapy. Pt struggles with conditioning to task  and adequate breath support which is limiting the pt from achieving goals in therapy.   OBJECTIVE IMPAIRMENTS include voice  disorder. These impairments are limiting patient from effectively communicating at home and in community. Factors affecting potential to achieve goals and functional outcome are previous level of function.. Patient will benefit from skilled SLP services to address above impairments and improve overall function.  REHAB POTENTIAL: Good  PLAN: SLP FREQUENCY: 1-2x/week  SLP DURATION: 8 weeks  PLANNED INTERVENTIONS: SLP instruction and feedback and Patient/family education Alphonzo Grieve, SLP Graduate Clinician    Happi B. Rutherford Nail, M.S., CCC-SLP, Mining engineer Certified Brain Injury [place]  [place] Office [phone] Ascom [phone] Fax [phone]

## 2023-01-16 ENCOUNTER — Ambulatory Visit: Payer: PPO | Admitting: Speech Pathology

## 2023-01-17 DIAGNOSIS — H903 Sensorineural hearing loss, bilateral: Secondary | ICD-10-CM | POA: Diagnosis not present

## 2023-01-17 DIAGNOSIS — R490 Dysphonia: Secondary | ICD-10-CM | POA: Diagnosis not present

## 2023-01-19 ENCOUNTER — Ambulatory Visit: Payer: PPO | Admitting: Speech Pathology

## 2023-01-19 DIAGNOSIS — R490 Dysphonia: Secondary | ICD-10-CM | POA: Diagnosis not present

## 2023-01-19 NOTE — Therapy (Signed)
OUTPATIENT SPEECH LANGUAGE PATHOLOGY TREATMENT NOTE      DISCHARGE  Patient Name: Kiara Perry MRN: [id number] DOB:12-22-1934, 88 y.o., female Today's Date: 01/19/2023  PCP: Fulton Reek, MD REFERRING PROVIDER: Beverly Gust, MD  END OF SESSION:   End of Session - 01/19/23 1344     Visit Number 7    Number of Visits 17    Date for SLP Re-Evaluation 02/23/23    Authorization Type [employer] P[employer]    Progress Note Due on Visit 10    SLP Start Time 27    SLP Stop Time  [id number]    SLP Time Calculation (min) 45 min    Activity Tolerance Patient tolerated treatment well             Past Medical History:  Diagnosis Date   Anginal pain ([place])    Aortic atherosclerosis ([place])    Arthritis    Coronary artery disease    COVID-19 09/2019   GERD (gastroesophageal reflux disease)    Heart murmur    History of pulmonary embolus ([place])    Hyperlipidemia    Hypertension    Pneumonia    Wears dentures    full upper   Wears hearing aid in both ears    Past Surgical History:  Procedure Laterality Date   ABDOMINAL HYSTERECTOMY     APPENDECTOMY     BACK SURGERY     BREAST BIOPSY Left 1973   neg   BREAST CYST ASPIRATION Left 1970   neg   COLONOSCOPY     ESOPHAGOGASTRODUODENOSCOPY     EYE SURGERY     cateract   IVC FILTER INSERTION N/A 10/13/2020   Procedure: IVC FILTER INSERTION;  Surgeon: Katha Cabal, MD;  Location: [facility];  Service: Cardiovascular;  Laterality: N/A;   IVC FILTER REMOVAL N/A 03/30/2021   Procedure: IVC FILTER REMOVAL;  Surgeon: Katha Cabal, MD;  Location: [facility];  Service: Cardiovascular;  Laterality: N/A;   KNEE ARTHROPLASTY Left 01/11/2021   Procedure: COMPUTER ASSISTED TOTAL KNEE ARTHROPLASTY - RNFA;  Surgeon: Dereck Leep, MD;  Location: [facility];  Service: Orthopedics;  Laterality: Left;   MENISCUS REPAIR     right knee   ORIF TOE FRACTURE Right 09/23/2021   Procedure: OPEN REDUCTION INTERNAL  FIXATION (ORIF) METATARSAL (TOE) FRACTURE - 5TH;  Surgeon: Caroline More, DPM;  Location: [place];  Service: Podiatry;  Laterality: Right;  nesthesia: Choice - pre-op pop saph/block   TONSILLECTOMY     Patient Active Problem List   Diagnosis Date Noted   Syncope 08/12/2022   UTI (urinary tract infection) 08/12/2022   Status post total left knee replacement 01/11/2021   History of pulmonary embolus (PE) 09/22/2020   Pain syndrome, chronic 06/24/2019   Hypertension 06/24/2019   Osteoarthritis 06/24/2019   Osteoporosis 06/24/2019   Venous ulcer of ankle, left ([place]) 06/17/2019   Lymphedema 04/21/2019   Venous insufficiency 04/21/2019   GERD (gastroesophageal reflux disease) 04/21/2019   Hyperlipidemia 04/21/2019   Edema of both legs 03/25/2019   Bradycardia 02/18/2019   Degenerative lumbar spinal stenosis 09/17/2018   Atherosclerosis of abdominal aorta ([place]) 07/27/2018   Coronary artery disease involving native coronary artery of native heart 07/27/2018   Coronary artery calcification 07/20/2018   Acute left-sided low back pain with left-sided sciatica 05/19/2017   Primary osteoarthritis of left knee 05/19/2017   SOBOE (shortness of breath on exertion) 12/08/2016    Onset date: 07/2022; date of referral  12/07/2022   REFERRING DIAG: Dysphonia (R49.0)   PERTINENT HISTORY: Pt is an 88 year old female with history of adenoidectomy and tonsillectomy,    DIAGNOSTIC FINDINGS:  Laryngoscopy on 12/06/2022 revealed good vocal cord mobility, mild edema, using vocal cords to speak.   THERAPY DIAG:  Dysphonia  Rationale for Evaluation and Treatment Rehabilitation  SUBJECTIVE: Pt reported a better sounding voice and use of exercises, reported she recently saw Dr.McQueen who informed she is using her true vocal folds when speaking.   Pt accompanied by: self  PAIN:  Are you having pain? No  PATIENT GOALS: to have a better sounding voice  OBJECTIVE:   TODAY'S  TREATMENT: Skilled treatment session focused on pt's dysphonia goals. SLP facilitated session by providing the following skilled interventions: SLP facilitated pt in conversational speech, pt maintained a smooth, clear, strong voice.  SLP led pt in neck stretches included head turn, head tilt left/right and head tilt up/down to promote relaxation. SLP instructed pt to deepen stretches as pt was independent in maintaining relaxed shoulders and was observed with relaxed posture.   Patient instructed in flow phonation through Skill level 1: Establish Airflow Release: Unarticulated: Maximal multi-modal assistance required to promote relaxed release of airflow; Articulated (unvoiced): pt benefited from min assist, demonstrating relaxed posture.   Pt reports voice is improved, noting she has had a decrease in gravely, harsh voice episodes.    VOICE HANDICAP INDEX (VHI)  The Voice Handicap Index is comprised of a series of questions to assess the patient's perception of their voice. It is designed to evaluate the emotional, physical and functional components of the voice problem.  Functional: 1 Physical: 2 Emotional: 1 Total: 5 (Normal mean 8.75, SD =14.97)  z score =   no significant impact, greatly reduced from initial evaluation   PATIENT EDUCATION: Education details: see above Person educated: Patient Education method: Consulting civil engineer, Demonstration, Verbal cues, and Handouts Education comprehension: needs further education  HOME EXERCISE PROGRAM:  Practice abdominal breathing, neck/shoulder stretches, heavy relaxed sigh self monitoring for tension   Warm up before answering phone (1-2-3-hello)   GOALS: Goals reviewed with patient? Yes   SHORT TERM GOALS: Target date: 10 sessions    The patient will demonstrate abdominal breathing patterns and steady release of breath on exhalation to optimize efficiency of voicing and decrease laryngeal hyperfunction. Baseline: Goal status: INITIAL    Goal Status: MET  2.  The patient will increase hydration for an eventual goal of 6-8 glasses per day and limit caffeine intake (to maximum of 1-2, 8 oz cups/day), as measured by patient report. Baseline:  Goal status: INITIAL   Goal Status: MET  3.  The patient will eliminate phonotraumatic behaviors such as chronic throat clearing, by substituting non-traumatic methods to clear mucus. Baseline:  Goal status: INITIAL  Goal Status: MET  4.  The patient will decrease laryngeal and articulatory muscle tension by independently completing relaxation/stretching exercises. Baseline:  Goal status: INITIAL  Goal Status: MET  5.  The patient will utilize a forward tone focused/resonant voice to decrease vocal hyperfunction and improve voice quality and vocal projection. Baseline:  Goal status: INITIAL   Goal Status: MET   LONG TERM GOALS: Target date: 02/23/2023    The patient will be independent for abdominal breathing and breath support exercises. Baseline:  Goal status: INITIAL   Goal Status: MET  2.  The patient will demonstrate independent understanding of vocal hygiene concepts. Baseline:  Goal status: INITIAL   Goal Status: MET  3.   The patient will maintain relaxed phonation for paragraph length recitation with 80% accuracy. Baseline:  Goal status: INITIAL  Goal Status: MET   ASSESSMENT:  CLINICAL IMPRESSION: Pt presents with improved dysphonia. Pt reports improved voice with lessening amount of episodes of harsh, gravely vocal quality. Pt has seen ENT who reports pt is using true vocal folds. SLP recommends pt continue to utilize strategies learned in therapy (for which she is Mod I doing) and discontinue services as ST is no longer needed.   Alphonzo Grieve, SLP Graduate Clinician    Happi B. Rutherford Nail, M.S., CCC-SLP, Mining engineer Certified Brain Injury [place]  [place] Office  [phone] Ascom [phone] Fax [phone]

## 2023-01-23 ENCOUNTER — Ambulatory Visit: Payer: PPO | Admitting: Speech Pathology

## 2023-01-25 ENCOUNTER — Ambulatory Visit: Payer: PPO | Admitting: Speech Pathology

## 2023-01-31 ENCOUNTER — Encounter: Payer: PPO | Admitting: Speech Pathology

## 2023-02-02 ENCOUNTER — Encounter: Payer: PPO | Admitting: Speech Pathology

## 2023-02-02 DIAGNOSIS — R001 Bradycardia, unspecified: Secondary | ICD-10-CM | POA: Diagnosis not present

## 2023-02-02 DIAGNOSIS — I700 Atherosclerosis of aorta: Secondary | ICD-10-CM | POA: Diagnosis not present

## 2023-02-02 DIAGNOSIS — I2510 Atherosclerotic heart disease of native coronary artery without angina pectoris: Secondary | ICD-10-CM | POA: Diagnosis not present

## 2023-02-02 DIAGNOSIS — Z86711 Personal history of pulmonary embolism: Secondary | ICD-10-CM | POA: Diagnosis not present

## 2023-02-02 DIAGNOSIS — E7800 Pure hypercholesterolemia, unspecified: Secondary | ICD-10-CM | POA: Diagnosis not present

## 2023-02-04 IMAGING — MR MR LUMBAR SPINE W/O CM
4 of 5 series · 33 of 48 positions shown · non-contrast
Comparison: MRI 05/27/2017

CLINICAL DATA: Chronic lower back pain

EXAM:
MRI LUMBAR SPINE WITHOUT CONTRAST
TECHNIQUE: Multiplanar, multisequence MR imaging of the lumbar spine was
performed. No intravenous contrast was administered.

[Series 5: T2 · sagittal · 4.0mm · 0.81mm/px · 8 of 17 slices shown (1 of 2)]
[im 1/17]
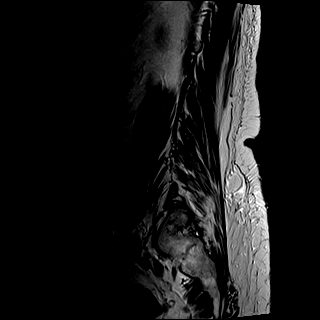
[im 3/17]
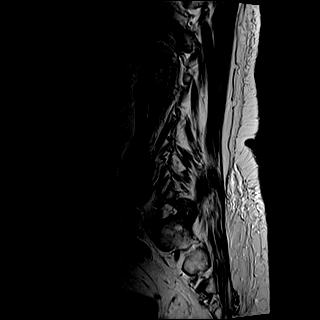
[im 5/17]
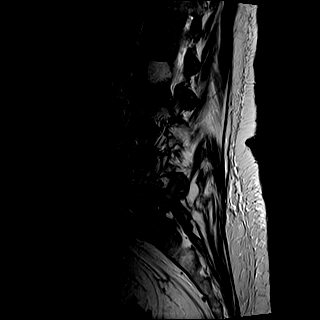
[im 7/17]
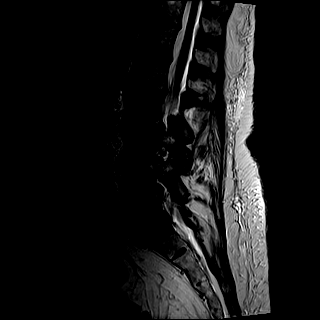
[im 10/17]
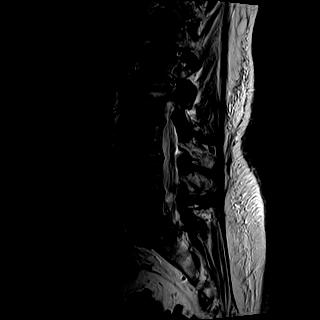
[im 12/17]
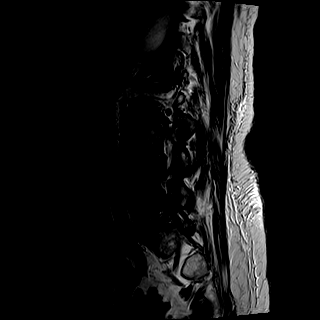
[im 14/17]
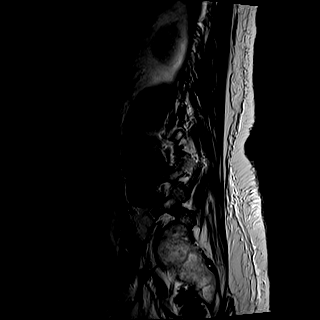
[im 17/17]
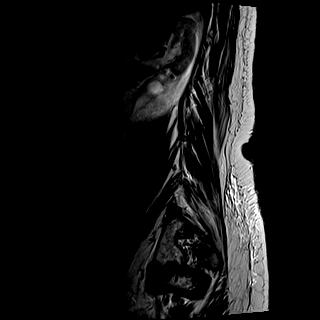

[Series 6: T1 · sagittal · 4.0mm · 0.81mm/px · 7 of 17 slices shown (1 of 2)]
[im 1/17]
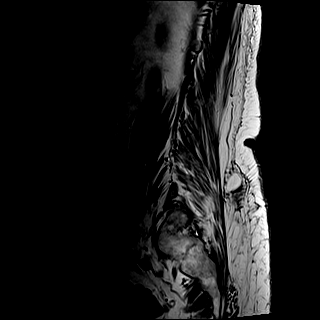
[im 3/17]
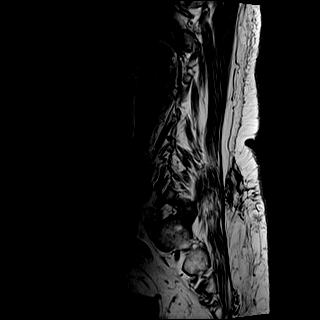
[im 6/17]
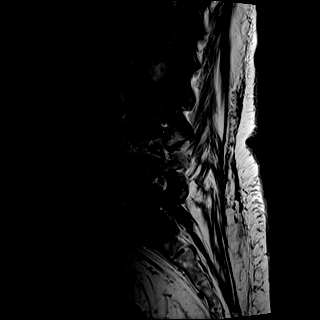
[im 9/17]
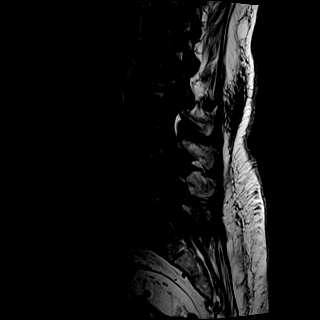
[im 11/17]
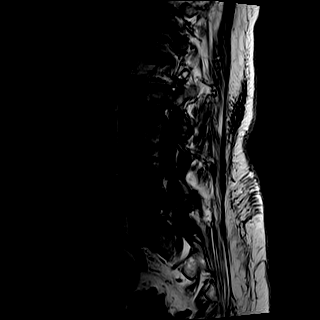
[im 14/17]
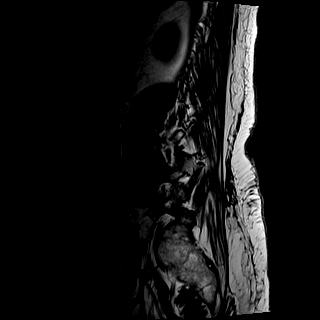
[im 17/17]
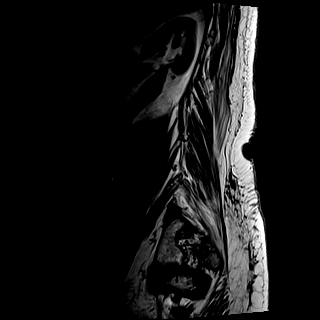

[Series 10: T2 · axial · 4.0mm · 0.78mm/px · z∈[-134,+50]mm · 9 of 32 slices shown (2 of 2)]
[im 1/32]
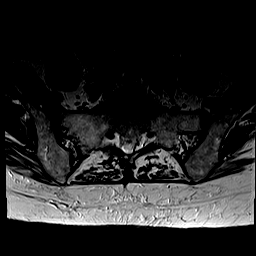
[im 6/32]
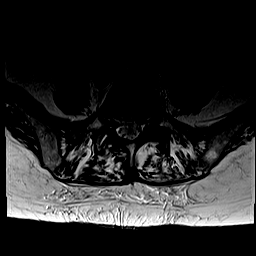
[im 11/32]
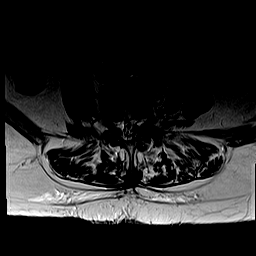
[im 13/32]
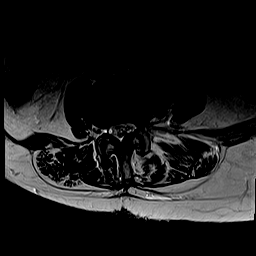
[im 16/32]
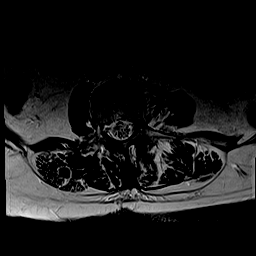
[im 19/32]
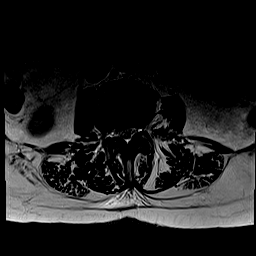
[im 21/32]
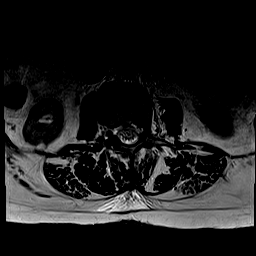
[im 26/32]
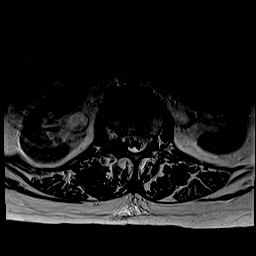
[im 32/32]
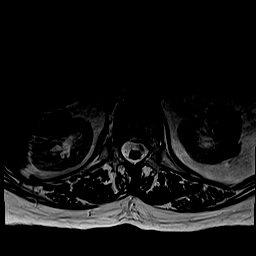

[Series 13: T1 · axial · 4.0mm · 0.39mm/px · z∈[-134,+50]mm · 9 of 32 slices shown (2 of 2)]
[im 1/32]
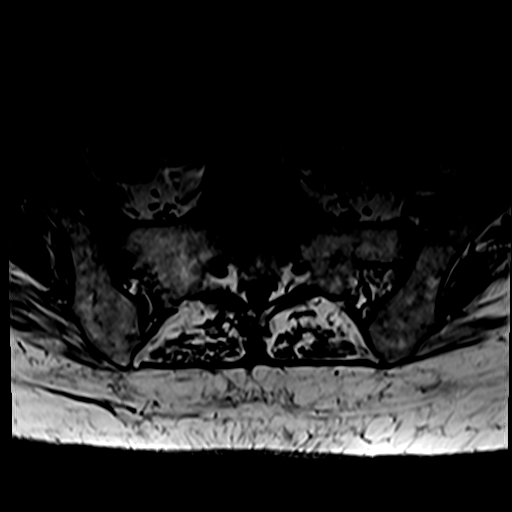
[im 6/32]
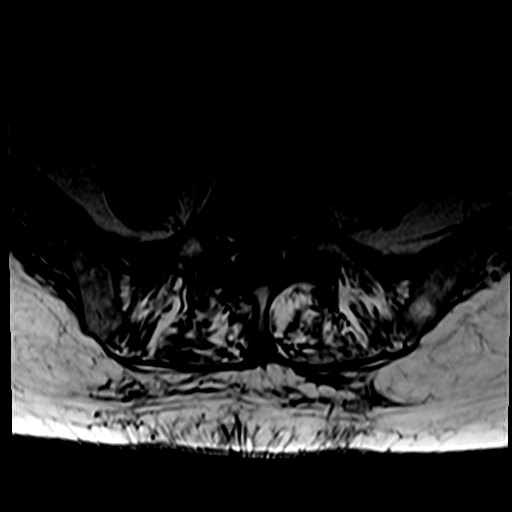
[im 11/32]
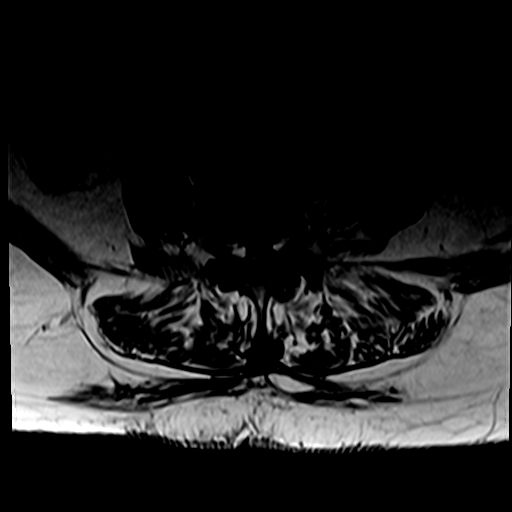
[im 13/32]
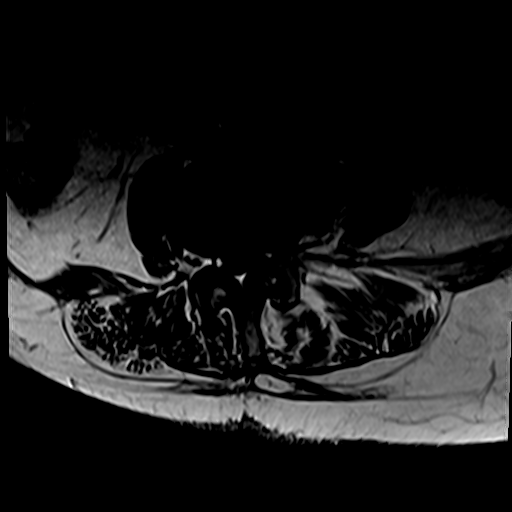
[im 16/32]
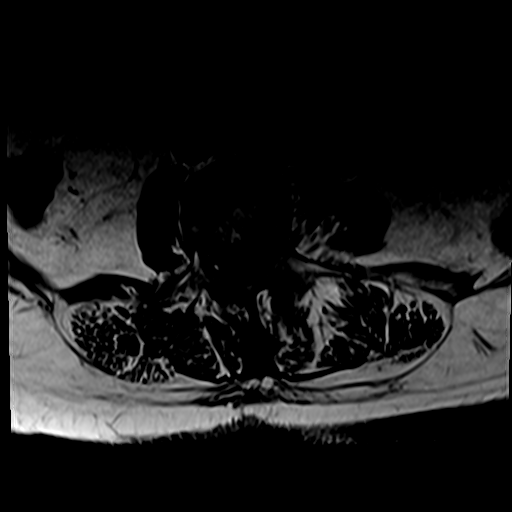
[im 19/32]
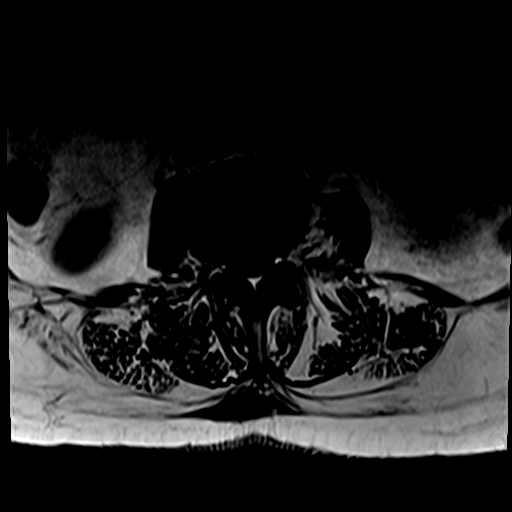
[im 21/32]
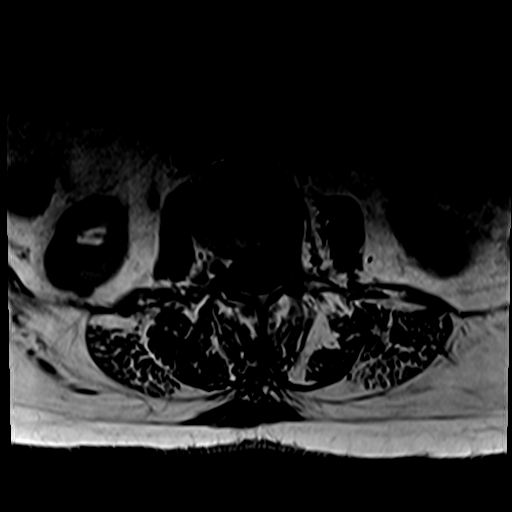
[im 26/32]
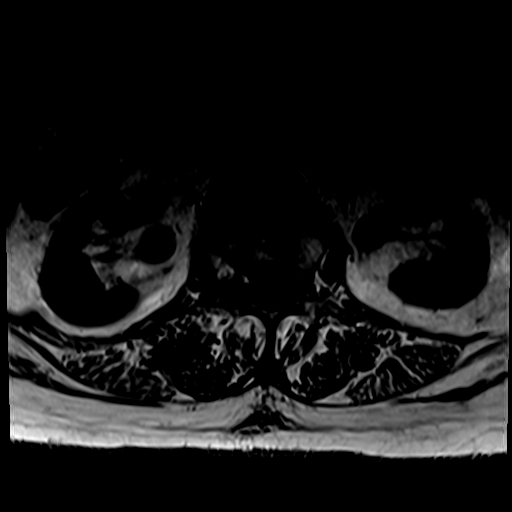
[im 32/32]
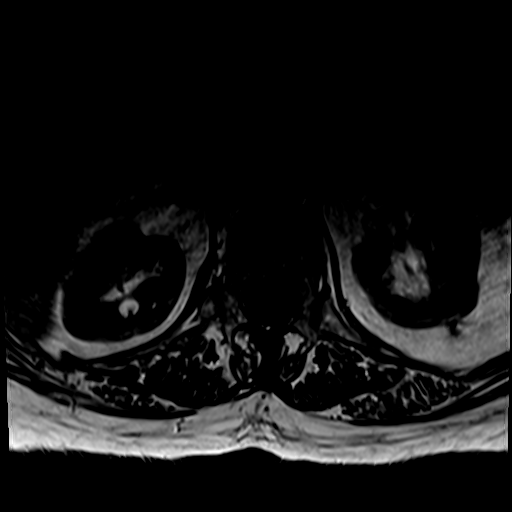

[33 of 48 positions shown; findings below may reference images not displayed]

FINDINGS: Segmentation:  Standard.

Alignment: Dextroconvex curvature. Unchanged trace anterolisthesis
at L5-S1.

Vertebrae: No fracture, evidence of discitis, or aggressive bone
lesion. There is a T1/T2 hyperintense lesion in the L1 vertebral
body which contains fat and is consistent with a hemangioma. This is
unchanged from prior exam in 4344.

Conus medullaris and cauda equina: Conus extends to the L1-L2 level.
Conus and cauda equina appear normal.

Paraspinal and other soft tissues: Tiny right posterior renal cyst.
Mild bilateral paraspinal muscle atrophy.

Disc levels:

T12-L1: Minimal disc bulging. No significant spinal canal or neural
foraminal narrowing

L1-L2: Severe right-sided disc height loss with broad-based disc
bulging, ligamentum flavum infolding and bilateral facet arthropathy
results in mild spinal canal narrowing. Mild right neural foraminal
narrowing.

L2-L3: Asymmetric right broad-based disc bulging, ligamentum flavum
hypertrophy and bilateral facet arthropathy result in mild spinal
canal stenosis. No significant neural foraminal narrowing.

L3-L4: Severe left-sided disc height loss with broad-based disc
bulging. Bilateral facet arthropathy and ligamentum flavum
hypertrophy. Mild spinal canal stenosis. Mild left neural foraminal
narrowing.

L4-L5: Severe disc height loss, ligamentum flavum hypertrophy, and
mild bilateral facet arthropathy results in moderate spinal canal
stenosis, mild left and mild right neural foraminal narrowing.

L5-S1: Unchanged trace anterolisthesis. Broad-based disc bulging and
bilateral facet arthropathy result and moderate left and mild right
neural foraminal narrowing.
IMPRESSION: Multilevel degenerative changes of lumbar spine, with varying
degrees of spinal canal and neural foraminal stenosis as summarized
below:

L1-L2: Mild spinal canal stenosis. Mild right neural foraminal
narrowing.

L2-L3: Mild spinal canal stenosis.  No neural foraminal narrowing.

L3-L4: Mild spinal canal stenosis. Mild left neural foraminal
narrowing.

L4-L5: Moderate spinal canal stenosis. Mild bilateral neural
foraminal narrowing.

L5-S1: Moderate left and mild right neural foraminal narrowing.

## 2023-02-07 ENCOUNTER — Encounter: Payer: PPO | Admitting: Speech Pathology

## 2023-02-10 ENCOUNTER — Encounter: Payer: PPO | Admitting: Speech Pathology

## 2023-03-24 DIAGNOSIS — R7309 Other abnormal glucose: Secondary | ICD-10-CM | POA: Diagnosis not present

## 2023-03-24 DIAGNOSIS — E782 Mixed hyperlipidemia: Secondary | ICD-10-CM | POA: Diagnosis not present

## 2023-03-24 DIAGNOSIS — I10 Essential (primary) hypertension: Secondary | ICD-10-CM | POA: Diagnosis not present

## 2023-03-24 DIAGNOSIS — Z79899 Other long term (current) drug therapy: Secondary | ICD-10-CM | POA: Diagnosis not present

## 2023-03-29 DIAGNOSIS — E782 Mixed hyperlipidemia: Secondary | ICD-10-CM | POA: Diagnosis not present

## 2023-03-29 DIAGNOSIS — M79642 Pain in left hand: Secondary | ICD-10-CM | POA: Diagnosis not present

## 2023-03-29 DIAGNOSIS — R7303 Prediabetes: Secondary | ICD-10-CM | POA: Diagnosis not present

## 2023-03-29 DIAGNOSIS — G894 Chronic pain syndrome: Secondary | ICD-10-CM | POA: Diagnosis not present

## 2023-03-29 DIAGNOSIS — Z79899 Other long term (current) drug therapy: Secondary | ICD-10-CM | POA: Diagnosis not present

## 2023-03-29 DIAGNOSIS — I10 Essential (primary) hypertension: Secondary | ICD-10-CM | POA: Diagnosis not present

## 2023-03-29 DIAGNOSIS — I2510 Atherosclerotic heart disease of native coronary artery without angina pectoris: Secondary | ICD-10-CM | POA: Diagnosis not present

## 2023-03-29 DIAGNOSIS — N1831 Chronic kidney disease, stage 3a: Secondary | ICD-10-CM | POA: Diagnosis not present

## 2023-03-29 DIAGNOSIS — M79641 Pain in right hand: Secondary | ICD-10-CM | POA: Diagnosis not present

## 2023-04-10 DIAGNOSIS — G5601 Carpal tunnel syndrome, right upper limb: Secondary | ICD-10-CM | POA: Diagnosis not present

## 2023-04-10 DIAGNOSIS — G5622 Lesion of ulnar nerve, left upper limb: Secondary | ICD-10-CM | POA: Diagnosis not present

## 2023-04-10 DIAGNOSIS — G5602 Carpal tunnel syndrome, left upper limb: Secondary | ICD-10-CM | POA: Diagnosis not present

## 2023-04-26 DIAGNOSIS — M25552 Pain in left hip: Secondary | ICD-10-CM | POA: Diagnosis not present

## 2023-04-26 DIAGNOSIS — M47816 Spondylosis without myelopathy or radiculopathy, lumbar region: Secondary | ICD-10-CM | POA: Diagnosis not present

## 2023-04-26 DIAGNOSIS — M4126 Other idiopathic scoliosis, lumbar region: Secondary | ICD-10-CM | POA: Diagnosis not present

## 2023-04-26 DIAGNOSIS — M8588 Other specified disorders of bone density and structure, other site: Secondary | ICD-10-CM | POA: Diagnosis not present

## 2023-04-26 DIAGNOSIS — M79605 Pain in left leg: Secondary | ICD-10-CM | POA: Diagnosis not present

## 2023-05-16 DIAGNOSIS — R200 Anesthesia of skin: Secondary | ICD-10-CM | POA: Diagnosis not present

## 2023-05-17 DIAGNOSIS — M48062 Spinal stenosis, lumbar region with neurogenic claudication: Secondary | ICD-10-CM | POA: Diagnosis not present

## 2023-05-17 DIAGNOSIS — M5136 Other intervertebral disc degeneration, lumbar region: Secondary | ICD-10-CM | POA: Diagnosis not present

## 2023-05-17 DIAGNOSIS — M5416 Radiculopathy, lumbar region: Secondary | ICD-10-CM | POA: Diagnosis not present

## 2023-05-25 DIAGNOSIS — G5603 Carpal tunnel syndrome, bilateral upper limbs: Secondary | ICD-10-CM | POA: Diagnosis not present

## 2023-05-30 DIAGNOSIS — M48062 Spinal stenosis, lumbar region with neurogenic claudication: Secondary | ICD-10-CM | POA: Diagnosis not present

## 2023-05-30 DIAGNOSIS — M5416 Radiculopathy, lumbar region: Secondary | ICD-10-CM | POA: Diagnosis not present

## 2023-06-21 DIAGNOSIS — M48062 Spinal stenosis, lumbar region with neurogenic claudication: Secondary | ICD-10-CM | POA: Diagnosis not present

## 2023-06-21 DIAGNOSIS — M5136 Other intervertebral disc degeneration, lumbar region: Secondary | ICD-10-CM | POA: Diagnosis not present

## 2023-06-21 DIAGNOSIS — M5416 Radiculopathy, lumbar region: Secondary | ICD-10-CM | POA: Diagnosis not present

## 2023-06-21 DIAGNOSIS — M7918 Myalgia, other site: Secondary | ICD-10-CM | POA: Diagnosis not present

## 2023-06-29 DIAGNOSIS — I10 Essential (primary) hypertension: Secondary | ICD-10-CM | POA: Diagnosis not present

## 2023-06-29 DIAGNOSIS — R7303 Prediabetes: Secondary | ICD-10-CM | POA: Diagnosis not present

## 2023-06-29 DIAGNOSIS — Z79899 Other long term (current) drug therapy: Secondary | ICD-10-CM | POA: Diagnosis not present

## 2023-07-03 ENCOUNTER — Other Ambulatory Visit: Payer: Self-pay | Admitting: Internal Medicine

## 2023-07-03 DIAGNOSIS — Z1231 Encounter for screening mammogram for malignant neoplasm of breast: Secondary | ICD-10-CM | POA: Diagnosis not present

## 2023-07-03 DIAGNOSIS — E782 Mixed hyperlipidemia: Secondary | ICD-10-CM | POA: Diagnosis not present

## 2023-07-03 DIAGNOSIS — N1831 Chronic kidney disease, stage 3a: Secondary | ICD-10-CM | POA: Diagnosis not present

## 2023-07-03 DIAGNOSIS — R7303 Prediabetes: Secondary | ICD-10-CM | POA: Diagnosis not present

## 2023-07-03 DIAGNOSIS — I2510 Atherosclerotic heart disease of native coronary artery without angina pectoris: Secondary | ICD-10-CM | POA: Diagnosis not present

## 2023-07-03 DIAGNOSIS — Z79899 Other long term (current) drug therapy: Secondary | ICD-10-CM | POA: Diagnosis not present

## 2023-07-03 DIAGNOSIS — I10 Essential (primary) hypertension: Secondary | ICD-10-CM | POA: Diagnosis not present

## 2023-07-03 DIAGNOSIS — G894 Chronic pain syndrome: Secondary | ICD-10-CM | POA: Diagnosis not present

## 2023-07-07 DIAGNOSIS — I2584 Coronary atherosclerosis due to calcified coronary lesion: Secondary | ICD-10-CM | POA: Diagnosis not present

## 2023-07-07 DIAGNOSIS — I2510 Atherosclerotic heart disease of native coronary artery without angina pectoris: Secondary | ICD-10-CM | POA: Diagnosis not present

## 2023-07-07 DIAGNOSIS — E782 Mixed hyperlipidemia: Secondary | ICD-10-CM | POA: Diagnosis not present

## 2023-07-07 DIAGNOSIS — I10 Essential (primary) hypertension: Secondary | ICD-10-CM | POA: Diagnosis not present

## 2023-07-07 DIAGNOSIS — I700 Atherosclerosis of aorta: Secondary | ICD-10-CM | POA: Diagnosis not present

## 2023-07-19 DIAGNOSIS — J069 Acute upper respiratory infection, unspecified: Secondary | ICD-10-CM | POA: Diagnosis not present

## 2023-07-19 DIAGNOSIS — Z03818 Encounter for observation for suspected exposure to other biological agents ruled out: Secondary | ICD-10-CM | POA: Diagnosis not present

## 2023-08-03 DIAGNOSIS — M5416 Radiculopathy, lumbar region: Secondary | ICD-10-CM | POA: Diagnosis not present

## 2023-08-03 DIAGNOSIS — M48062 Spinal stenosis, lumbar region with neurogenic claudication: Secondary | ICD-10-CM | POA: Diagnosis not present

## 2023-08-03 DIAGNOSIS — M51362 Other intervertebral disc degeneration, lumbar region with discogenic back pain and lower extremity pain: Secondary | ICD-10-CM | POA: Diagnosis not present

## 2023-08-31 DIAGNOSIS — M48062 Spinal stenosis, lumbar region with neurogenic claudication: Secondary | ICD-10-CM | POA: Diagnosis not present

## 2023-08-31 DIAGNOSIS — M5416 Radiculopathy, lumbar region: Secondary | ICD-10-CM | POA: Diagnosis not present

## 2023-09-07 ENCOUNTER — Ambulatory Visit
Admission: RE | Admit: 2023-09-07 | Discharge: 2023-09-07 | Disposition: A | Discharge status: Home / Self Care | Payer: PPO | Source: Ambulatory Visit | Attending: Internal Medicine | Admitting: Internal Medicine

## 2023-09-07 DIAGNOSIS — Z1231 Encounter for screening mammogram for malignant neoplasm of breast: Secondary | ICD-10-CM | POA: Insufficient documentation

## 2023-09-12 DIAGNOSIS — Z800 Family history of malignant neoplasm of digestive organs: Secondary | ICD-10-CM | POA: Diagnosis not present

## 2023-09-12 DIAGNOSIS — Z860101 Personal history of adenomatous and serrated colon polyps: Secondary | ICD-10-CM | POA: Diagnosis not present

## 2023-09-12 DIAGNOSIS — K219 Gastro-esophageal reflux disease without esophagitis: Secondary | ICD-10-CM | POA: Diagnosis not present

## 2023-10-02 DIAGNOSIS — M5380 Other specified dorsopathies, site unspecified: Secondary | ICD-10-CM | POA: Diagnosis not present

## 2023-10-02 DIAGNOSIS — M5416 Radiculopathy, lumbar region: Secondary | ICD-10-CM | POA: Diagnosis not present

## 2023-10-02 DIAGNOSIS — M51362 Other intervertebral disc degeneration, lumbar region with discogenic back pain and lower extremity pain: Secondary | ICD-10-CM | POA: Diagnosis not present

## 2023-10-02 DIAGNOSIS — M48062 Spinal stenosis, lumbar region with neurogenic claudication: Secondary | ICD-10-CM | POA: Diagnosis not present

## 2023-10-04 DIAGNOSIS — I10 Essential (primary) hypertension: Secondary | ICD-10-CM | POA: Diagnosis not present

## 2023-10-04 DIAGNOSIS — E782 Mixed hyperlipidemia: Secondary | ICD-10-CM | POA: Diagnosis not present

## 2023-10-04 DIAGNOSIS — Z860101 Personal history of adenomatous and serrated colon polyps: Secondary | ICD-10-CM | POA: Diagnosis not present

## 2023-10-04 DIAGNOSIS — Z800 Family history of malignant neoplasm of digestive organs: Secondary | ICD-10-CM | POA: Diagnosis not present

## 2023-10-04 DIAGNOSIS — K219 Gastro-esophageal reflux disease without esophagitis: Secondary | ICD-10-CM | POA: Diagnosis not present

## 2023-10-04 DIAGNOSIS — K5900 Constipation, unspecified: Secondary | ICD-10-CM | POA: Diagnosis not present

## 2023-10-04 DIAGNOSIS — Z79899 Other long term (current) drug therapy: Secondary | ICD-10-CM | POA: Diagnosis not present

## 2023-10-04 DIAGNOSIS — R7303 Prediabetes: Secondary | ICD-10-CM | POA: Diagnosis not present

## 2023-10-12 DIAGNOSIS — E782 Mixed hyperlipidemia: Secondary | ICD-10-CM | POA: Diagnosis not present

## 2023-10-12 DIAGNOSIS — Z0000 Encounter for general adult medical examination without abnormal findings: Secondary | ICD-10-CM | POA: Diagnosis not present

## 2023-10-12 DIAGNOSIS — R7303 Prediabetes: Secondary | ICD-10-CM | POA: Diagnosis not present

## 2023-10-12 DIAGNOSIS — I10 Essential (primary) hypertension: Secondary | ICD-10-CM | POA: Diagnosis not present

## 2023-10-12 DIAGNOSIS — N1831 Chronic kidney disease, stage 3a: Secondary | ICD-10-CM | POA: Diagnosis not present

## 2023-10-12 DIAGNOSIS — G894 Chronic pain syndrome: Secondary | ICD-10-CM | POA: Diagnosis not present

## 2023-12-08 DIAGNOSIS — M5416 Radiculopathy, lumbar region: Secondary | ICD-10-CM | POA: Diagnosis not present

## 2023-12-08 DIAGNOSIS — M48062 Spinal stenosis, lumbar region with neurogenic claudication: Secondary | ICD-10-CM | POA: Diagnosis not present

## 2023-12-11 ENCOUNTER — Telehealth: Payer: Self-pay | Admitting: *Deleted

## 2023-12-11 NOTE — Patient Instructions (Signed)
Visit Information  Thank you for taking time to visit with me today. Please don't hesitate to contact me if I can be of assistance to you before our next scheduled telephone appointment.  Following are the goals we discussed today:  Continue taking medications as instructed. Keep and attend upcoming appointments.   Please call the Suicide and Crisis Lifeline: 988 call the [place] National Suicide Prevention Lifeline: 904 678 5494 or TTY: 904-852-0442 TTY 831 833 2207) to talk to a trained counselor call 1-800-273-TALK (toll free, 24 hour hotline) call 911 if you are experiencing a Mental Health or Behavioral Health Crisis or need someone to talk to.  Patient verbalizes understanding of instructions and care plan provided today and agrees to view in MyChart. Active MyChart status and patient understanding of how to access instructions and care plan via MyChart confirmed with patient.     The patient has been provided with contact information for the care management team and has been advised to call with any health related questions or concerns.   Rodney Langton, RN, MSN, CCM [facility], [facility] RN Care Coordinator Direct Dial: [phone] / Main [phone] Fax [phone] Email: [email or web address]: [email or web address]

## 2023-12-11 NOTE — Patient Outreach (Signed)
  Care Coordination   Follow Up Visit Note   12/11/2023 Name: SHAQUOYA COSPER MRN: [id number] DOB: 08/10/1935  AISA SCHOEPPNER is a 89 y.o. year old female who sees Sparks, Duane Lope, MD for primary care. I spoke with  Barnet Glasgow by phone today.  What matters to the patients health and wellness today?  Patient report she has been doing well, all conditions are stable.  Denies any urgent concerns, encouraged to contact this care manager with questions.      Goals Addressed             This Visit's Progress    COMPLETED: management of chronic conditions, including HTN       Care Coordination Interventions: Evaluation of current treatment plan related to hypertension self management and patient's adherence to plan as established by provider Provided education to patient re: stroke prevention, s/s of heart attack and stroke Reviewed medications with patient and discussed importance of compliance Discussed plans with patient for ongoing care management follow up and provided patient with direct contact information for care management team Reviewed scheduled/upcoming provider appointments including:  Discussed complications of poorly controlled blood pressure such as heart disease, stroke, circulatory complications, vision complications, kidney impairment, sexual dysfunction          SDOH assessments and interventions completed:  No     Care Coordination Interventions:  Yes, provided   Interventions Today    Flowsheet Row Most Recent Value  Chronic Disease   Chronic disease during today's visit Other  [CAD, GERD, HLD, chronic back pain]  General Interventions   General Interventions Discussed/Reviewed General Interventions Reviewed, Doctor Visits  Doctor Visits Discussed/Reviewed Doctor Visits Reviewed, PCP, Specialist  [Upcoming: Cardiology 3/13, pain clinic 3/14, and PCP 3/27]  PCP/Specialist Visits Compliance with follow-up visit  Education Interventions    Education Provided Provided Education  Provided Verbal Education On When to see the doctor, Medication  [Medications reviewed, taking as instructed]       Follow up plan: No further intervention required.   Encounter Outcome:  Patient Visit Completed   Rodney Langton, RN, MSN, CCM [place]  [facility], [facility] RN Care Coordinator Direct Dial: [phone] / Main [phone] Fax [phone] Email: [email or web address]

## 2024-01-02 DIAGNOSIS — Z961 Presence of intraocular lens: Secondary | ICD-10-CM | POA: Diagnosis not present

## 2024-01-02 DIAGNOSIS — H11153 Pinguecula, bilateral: Secondary | ICD-10-CM | POA: Diagnosis not present

## 2024-01-04 DIAGNOSIS — I10 Essential (primary) hypertension: Secondary | ICD-10-CM | POA: Diagnosis not present

## 2024-01-04 DIAGNOSIS — I700 Atherosclerosis of aorta: Secondary | ICD-10-CM | POA: Diagnosis not present

## 2024-01-04 DIAGNOSIS — E782 Mixed hyperlipidemia: Secondary | ICD-10-CM | POA: Diagnosis not present

## 2024-01-04 DIAGNOSIS — I2510 Atherosclerotic heart disease of native coronary artery without angina pectoris: Secondary | ICD-10-CM | POA: Diagnosis not present

## 2024-01-05 DIAGNOSIS — M5380 Other specified dorsopathies, site unspecified: Secondary | ICD-10-CM | POA: Diagnosis not present

## 2024-01-05 DIAGNOSIS — M5416 Radiculopathy, lumbar region: Secondary | ICD-10-CM | POA: Diagnosis not present

## 2024-01-05 DIAGNOSIS — M51362 Other intervertebral disc degeneration, lumbar region with discogenic back pain and lower extremity pain: Secondary | ICD-10-CM | POA: Diagnosis not present

## 2024-01-05 DIAGNOSIS — M48062 Spinal stenosis, lumbar region with neurogenic claudication: Secondary | ICD-10-CM | POA: Diagnosis not present

## 2024-01-18 DIAGNOSIS — Z79899 Other long term (current) drug therapy: Secondary | ICD-10-CM | POA: Diagnosis not present

## 2024-01-18 DIAGNOSIS — I10 Essential (primary) hypertension: Secondary | ICD-10-CM | POA: Diagnosis not present

## 2024-01-18 DIAGNOSIS — E7800 Pure hypercholesterolemia, unspecified: Secondary | ICD-10-CM | POA: Diagnosis not present

## 2024-01-18 DIAGNOSIS — R059 Cough, unspecified: Secondary | ICD-10-CM | POA: Diagnosis not present

## 2024-01-18 DIAGNOSIS — R7303 Prediabetes: Secondary | ICD-10-CM | POA: Diagnosis not present

## 2024-01-18 DIAGNOSIS — G894 Chronic pain syndrome: Secondary | ICD-10-CM | POA: Diagnosis not present

## 2024-01-18 DIAGNOSIS — N1831 Chronic kidney disease, stage 3a: Secondary | ICD-10-CM | POA: Diagnosis not present

## 2024-01-18 DIAGNOSIS — M79641 Pain in right hand: Secondary | ICD-10-CM | POA: Diagnosis not present

## 2024-01-18 DIAGNOSIS — Z0000 Encounter for general adult medical examination without abnormal findings: Secondary | ICD-10-CM | POA: Diagnosis not present

## 2024-01-18 DIAGNOSIS — R051 Acute cough: Secondary | ICD-10-CM | POA: Diagnosis not present

## 2024-02-02 DIAGNOSIS — N1831 Chronic kidney disease, stage 3a: Secondary | ICD-10-CM | POA: Diagnosis not present

## 2024-02-02 DIAGNOSIS — Z79899 Other long term (current) drug therapy: Secondary | ICD-10-CM | POA: Diagnosis not present

## 2024-02-06 DIAGNOSIS — G5601 Carpal tunnel syndrome, right upper limb: Secondary | ICD-10-CM | POA: Diagnosis not present

## 2024-03-26 DIAGNOSIS — M2011 Hallux valgus (acquired), right foot: Secondary | ICD-10-CM | POA: Diagnosis not present

## 2024-03-26 DIAGNOSIS — M79671 Pain in right foot: Secondary | ICD-10-CM | POA: Diagnosis not present

## 2024-03-26 DIAGNOSIS — G629 Polyneuropathy, unspecified: Secondary | ICD-10-CM | POA: Diagnosis not present

## 2024-03-26 DIAGNOSIS — M2041 Other hammer toe(s) (acquired), right foot: Secondary | ICD-10-CM | POA: Diagnosis not present

## 2024-03-26 DIAGNOSIS — M2012 Hallux valgus (acquired), left foot: Secondary | ICD-10-CM | POA: Diagnosis not present

## 2024-04-23 DIAGNOSIS — R7303 Prediabetes: Secondary | ICD-10-CM | POA: Diagnosis not present

## 2024-04-23 DIAGNOSIS — M79641 Pain in right hand: Secondary | ICD-10-CM | POA: Diagnosis not present

## 2024-04-23 DIAGNOSIS — I10 Essential (primary) hypertension: Secondary | ICD-10-CM | POA: Diagnosis not present

## 2024-04-23 DIAGNOSIS — N1831 Chronic kidney disease, stage 3a: Secondary | ICD-10-CM | POA: Diagnosis not present

## 2024-04-23 DIAGNOSIS — G894 Chronic pain syndrome: Secondary | ICD-10-CM | POA: Diagnosis not present

## 2024-04-23 DIAGNOSIS — E559 Vitamin D deficiency, unspecified: Secondary | ICD-10-CM | POA: Diagnosis not present

## 2024-04-23 DIAGNOSIS — Z79899 Other long term (current) drug therapy: Secondary | ICD-10-CM | POA: Diagnosis not present

## 2024-04-23 DIAGNOSIS — E782 Mixed hyperlipidemia: Secondary | ICD-10-CM | POA: Diagnosis not present

## 2024-05-06 DIAGNOSIS — G5601 Carpal tunnel syndrome, right upper limb: Secondary | ICD-10-CM | POA: Diagnosis not present

## 2024-05-10 DIAGNOSIS — M2041 Other hammer toe(s) (acquired), right foot: Secondary | ICD-10-CM | POA: Diagnosis not present

## 2024-05-10 DIAGNOSIS — G629 Polyneuropathy, unspecified: Secondary | ICD-10-CM | POA: Diagnosis not present

## 2024-05-10 DIAGNOSIS — M2012 Hallux valgus (acquired), left foot: Secondary | ICD-10-CM | POA: Diagnosis not present

## 2024-05-10 DIAGNOSIS — M79671 Pain in right foot: Secondary | ICD-10-CM | POA: Diagnosis not present

## 2024-05-10 DIAGNOSIS — M2011 Hallux valgus (acquired), right foot: Secondary | ICD-10-CM | POA: Diagnosis not present

## 2024-05-13 ENCOUNTER — Other Ambulatory Visit: Payer: Self-pay | Admitting: Podiatry

## 2024-05-22 DIAGNOSIS — G5601 Carpal tunnel syndrome, right upper limb: Secondary | ICD-10-CM | POA: Diagnosis not present

## 2024-06-06 ENCOUNTER — Ambulatory Visit: Admit: 2024-06-06 | Admitting: Podiatry

## 2024-06-06 SURGERY — BUNIONECTOMY, LAPIDUS
Anesthesia: Choice | Site: Second Toe | Laterality: Right

## 2024-07-03 DIAGNOSIS — I2510 Atherosclerotic heart disease of native coronary artery without angina pectoris: Secondary | ICD-10-CM | POA: Diagnosis not present

## 2024-07-03 DIAGNOSIS — E782 Mixed hyperlipidemia: Secondary | ICD-10-CM | POA: Diagnosis not present

## 2024-07-03 DIAGNOSIS — Z01818 Encounter for other preprocedural examination: Secondary | ICD-10-CM | POA: Diagnosis not present

## 2024-07-03 DIAGNOSIS — E7800 Pure hypercholesterolemia, unspecified: Secondary | ICD-10-CM | POA: Diagnosis not present

## 2024-07-03 DIAGNOSIS — I10 Essential (primary) hypertension: Secondary | ICD-10-CM | POA: Diagnosis not present

## 2024-07-03 DIAGNOSIS — I700 Atherosclerosis of aorta: Secondary | ICD-10-CM | POA: Diagnosis not present

## 2024-07-08 DIAGNOSIS — G629 Polyneuropathy, unspecified: Secondary | ICD-10-CM | POA: Diagnosis not present

## 2024-07-08 DIAGNOSIS — M2011 Hallux valgus (acquired), right foot: Secondary | ICD-10-CM | POA: Diagnosis not present

## 2024-07-08 DIAGNOSIS — M79671 Pain in right foot: Secondary | ICD-10-CM | POA: Diagnosis not present

## 2024-07-08 DIAGNOSIS — M2041 Other hammer toe(s) (acquired), right foot: Secondary | ICD-10-CM | POA: Diagnosis not present

## 2024-07-08 DIAGNOSIS — M2012 Hallux valgus (acquired), left foot: Secondary | ICD-10-CM | POA: Diagnosis not present

## 2024-07-10 ENCOUNTER — Other Ambulatory Visit: Payer: Self-pay | Admitting: Podiatry

## 2024-07-24 DIAGNOSIS — R8290 Unspecified abnormal findings in urine: Secondary | ICD-10-CM | POA: Diagnosis not present

## 2024-07-24 DIAGNOSIS — E782 Mixed hyperlipidemia: Secondary | ICD-10-CM | POA: Diagnosis not present

## 2024-07-24 DIAGNOSIS — R7303 Prediabetes: Secondary | ICD-10-CM | POA: Diagnosis not present

## 2024-07-24 DIAGNOSIS — Z79899 Other long term (current) drug therapy: Secondary | ICD-10-CM | POA: Diagnosis not present

## 2024-07-24 DIAGNOSIS — G894 Chronic pain syndrome: Secondary | ICD-10-CM | POA: Diagnosis not present

## 2024-07-24 DIAGNOSIS — I10 Essential (primary) hypertension: Secondary | ICD-10-CM | POA: Diagnosis not present

## 2024-07-24 DIAGNOSIS — N1831 Chronic kidney disease, stage 3a: Secondary | ICD-10-CM | POA: Diagnosis not present

## 2024-07-24 DIAGNOSIS — R0602 Shortness of breath: Secondary | ICD-10-CM | POA: Diagnosis not present

## 2024-07-24 DIAGNOSIS — K219 Gastro-esophageal reflux disease without esophagitis: Secondary | ICD-10-CM | POA: Diagnosis not present

## 2024-07-26 ENCOUNTER — Encounter: Payer: Self-pay | Admitting: Podiatry

## 2024-07-26 NOTE — Anesthesia Preprocedure Evaluation (Addendum)
 Anesthesia Evaluation  Patient identified by MRN, date of birth, ID band Patient awake    Reviewed: Allergy & Precautions, H&P , NPO status , Patient's Chart, lab work & pertinent test results  Airway Mallampati: III  TM Distance: <3 FB Neck ROM: Full    Dental no notable dental hx. (+) Upper Dentures   Pulmonary neg pulmonary ROS, pneumonia   Pulmonary exam normal breath sounds clear to auscultation       Cardiovascular hypertension, + angina  + CAD  Normal cardiovascular exam+ Valvular Problems/Murmurs  Rhythm:Regular Rate:Normal  10-27-22 echo DOPPLER ECHO and OTHER SPECIAL PROCEDURES                 Aortic: TRIVIAL AR                 No AS                 Mitral: MILD MR                    No MS                         MV Inflow E Vel = 103.0 cm/sec      MV Annulus E'Vel = 8.2 cm/sec                         E/E'Ratio = 12.5              Tricuspid: MILD TR                    No TS              Pulmonary: No PR                      No PS  _________________________________________________________________________________________  INTERPRETATION  NORMAL LEFT VENTRICULAR SYSTOLIC FUNCTION  NORMAL RIGHT VENTRICULAR SYSTOLIC FUNCTION  MILD VALVULAR REGURGITATION (See above)  NO VALVULAR STENOSIS  GLS 20.9%  ________________________________________  07-03-24 cardiac letter of risk: Regarding preop risk stratification, she will be at moderate risk for foot surgery.  No further cardiac evaluation is indicated at this time.  Optimized from cardiac standpoint.  Continue current medical therapy.   Neuro/Psych  Neuromuscular disease negative neurological ROS  negative psych ROS   GI/Hepatic negative GI ROS, Neg liver ROS,GERD  ,,  Endo/Other  negative endocrine ROS    Renal/GU Renal diseasenegative Renal ROS  negative genitourinary   Musculoskeletal negative musculoskeletal ROS (+) Arthritis ,    Abdominal   Peds negative  pediatric ROS (+)  Hematology negative hematology ROS (+)   Anesthesia Other Findings Cardiologist letter of risk: Regarding preop risk stratification, she will be at moderate risk for foot surgery.  No further cardiac evaluation is indicated at this time.  Optimized from cardiac standpoint.  Continue current medical therapy.  Discussed w/patient cardiologist letter of moderate risk, which includes risk of heart attack, stroke and death. Patient wishes to proceed.    Surgeon requests popliteal/saphenous block and these placed preop, after discussion w/patient for risks/benefits.   Arthritis Hyperlipidemia Hypertension Coronary artery disease Anginal pain Heart murmur Pneumonia COVID-19 GERD (gastroesophageal reflux disease)  Aortic atherosclerosis History of pulmonary embolus (PE)  Wears hearing aid in both ears Wears upper dentures     Reproductive/Obstetrics negative OB ROS  Anesthesia Physical Anesthesia Plan  ASA: 3  Anesthesia Plan:    Post-op Pain Management:    Induction:   PONV Risk Score and Plan:   Airway Management Planned:   Additional Equipment:   Intra-op Plan:   Post-operative Plan:   Informed Consent:   Plan Discussed with:   Anesthesia Plan Comments:          Anesthesia Quick Evaluation

## 2024-07-29 ENCOUNTER — Other Ambulatory Visit: Payer: Self-pay

## 2024-07-29 ENCOUNTER — Encounter: Payer: Self-pay | Admitting: Podiatry

## 2024-07-29 NOTE — Discharge Instructions (Signed)
 [facility]  POST OPERATIVE INSTRUCTIONS FOR DR. ASHLEY AND DR. BAKER [facility] CLINIC PODIATRY DEPARTMENT   Take your medication as prescribed.  Pain medication should be taken only as needed.  Keep the dressing clean, dry and intact.  Keep your foot elevated above the heart level for the first 48 hours.  Walking to the bathroom and brief periods of walking are acceptable, unless we have instructed you to be non-weight bearing.  Always wear your post-op shoe when walking.  Always use your crutches if you are to be non-weight bearing.  Do not take a shower. Baths are permissible as long as the foot is kept out of the water.   Every hour you are awake:  Bend your knee 15 times. Flex foot 15 times Massage calf 15 times  Call [facility] [phone]) if any of the following problems occur: You develop a temperature or fever. The bandage becomes saturated with blood. Medication does not stop your pain. Injury of the foot occurs. Any symptoms of infection including redness, odor, or red streaks running from wound.

## 2024-08-01 ENCOUNTER — Ambulatory Visit: Payer: Self-pay | Admitting: Anesthesiology

## 2024-08-01 ENCOUNTER — Other Ambulatory Visit: Payer: Self-pay

## 2024-08-01 ENCOUNTER — Encounter
Admission: RE | Disposition: A | Discharge status: Home / Self Care | Payer: Self-pay | Source: Home / Self Care | Attending: Podiatry

## 2024-08-01 ENCOUNTER — Ambulatory Visit: Payer: Self-pay

## 2024-08-01 ENCOUNTER — Ambulatory Visit
Admission: RE | Admit: 2024-08-01 | Discharge: 2024-08-01 | Disposition: A | Discharge status: Home / Self Care | Attending: Podiatry | Admitting: Podiatry

## 2024-08-01 DIAGNOSIS — M2041 Other hammer toe(s) (acquired), right foot: Secondary | ICD-10-CM | POA: Diagnosis not present

## 2024-08-01 DIAGNOSIS — N1831 Chronic kidney disease, stage 3a: Secondary | ICD-10-CM | POA: Diagnosis not present

## 2024-08-01 DIAGNOSIS — M2011 Hallux valgus (acquired), right foot: Secondary | ICD-10-CM | POA: Diagnosis not present

## 2024-08-01 DIAGNOSIS — I2510 Atherosclerotic heart disease of native coronary artery without angina pectoris: Secondary | ICD-10-CM | POA: Insufficient documentation

## 2024-08-01 DIAGNOSIS — I129 Hypertensive chronic kidney disease with stage 1 through stage 4 chronic kidney disease, or unspecified chronic kidney disease: Secondary | ICD-10-CM | POA: Diagnosis not present

## 2024-08-01 DIAGNOSIS — E785 Hyperlipidemia, unspecified: Secondary | ICD-10-CM | POA: Diagnosis not present

## 2024-08-01 DIAGNOSIS — G8918 Other acute postprocedural pain: Secondary | ICD-10-CM | POA: Diagnosis not present

## 2024-08-01 DIAGNOSIS — I10 Essential (primary) hypertension: Secondary | ICD-10-CM | POA: Insufficient documentation

## 2024-08-01 HISTORY — DX: Chronic kidney disease, stage 3a: N18.31

## 2024-08-01 HISTORY — PX: TENOTOMY: SHX7694

## 2024-08-01 HISTORY — PX: HAMMER TOE SURGERY: SHX385

## 2024-08-01 HISTORY — DX: Nonrheumatic mitral (valve) insufficiency: I34.0

## 2024-08-01 HISTORY — DX: Rheumatic tricuspid insufficiency: I07.1

## 2024-08-01 HISTORY — PX: HALLUX VALGUS LAPIDUS: SHX6626

## 2024-08-01 SURGERY — BUNIONECTOMY, LAPIDUS
Anesthesia: Choice | Site: Second Toe | Laterality: Right

## 2024-08-01 MED ORDER — OXYCODONE HCL 5 MG PO TABS
ORAL_TABLET | ORAL | Status: AC
  Filled 2024-08-01: qty 2

## 2024-08-01 MED ORDER — MIDAZOLAM HCL 2 MG/2ML IJ SOLN
2.0000 mg | Freq: Once | INTRAMUSCULAR | Status: AC
  Administered 2024-08-01: 2 mg via INTRAVENOUS

## 2024-08-01 MED ORDER — BUPIVACAINE HCL 0.25 % IJ SOLN
INTRAMUSCULAR | Status: AC
  Filled 2024-08-01: qty 1

## 2024-08-01 MED ORDER — LIDOCAINE HCL (PF) 2 % IJ SOLN
INTRAMUSCULAR | Status: AC
  Filled 2024-08-01: qty 2

## 2024-08-01 MED ORDER — LIDOCAINE HCL (PF) 2 % IJ SOLN
INTRAMUSCULAR | Status: AC
  Filled 2024-08-01: qty 5

## 2024-08-01 MED ORDER — OXYCODONE-ACETAMINOPHEN 5-325 MG PO TABS: 1.0000 | ORAL_TABLET | Freq: Four times a day (QID) | ORAL | 0 refills | Status: AC | PRN

## 2024-08-01 MED ORDER — HYDROMORPHONE HCL 1 MG/ML IJ SOLN: 0.5000 mg | INTRAMUSCULAR | Status: DC | PRN

## 2024-08-01 MED ORDER — EPHEDRINE SULFATE (PRESSORS) 50 MG/ML IJ SOLN
INTRAMUSCULAR | Status: DC | PRN
  Administered 2024-08-01: 5 mg via INTRAVENOUS

## 2024-08-01 MED ORDER — PROPOFOL 10 MG/ML IV BOLUS
INTRAVENOUS | Status: DC | PRN
  Administered 2024-08-01: 70 mg via INTRAVENOUS

## 2024-08-01 MED ORDER — ASPIRIN 81 MG PO TBEC: 81.0000 mg | DELAYED_RELEASE_TABLET | Freq: Two times a day (BID) | ORAL | 0 refills | Status: AC

## 2024-08-01 MED ORDER — 0.9 % SODIUM CHLORIDE (POUR BTL) OPTIME
TOPICAL | Status: DC | PRN
  Administered 2024-08-01: 500 mL

## 2024-08-01 MED ORDER — ONDANSETRON HCL 4 MG/2ML IJ SOLN
INTRAMUSCULAR | Status: DC | PRN
  Administered 2024-08-01: 4 mg via INTRAVENOUS

## 2024-08-01 MED ORDER — ONDANSETRON HCL 4 MG/2ML IJ SOLN
INTRAMUSCULAR | Status: AC
  Filled 2024-08-01: qty 2

## 2024-08-01 MED ORDER — DEXMEDETOMIDINE HCL IN NACL 80 MCG/20ML IV SOLN
INTRAVENOUS | Status: DC | PRN
Start: 2024-08-01 — End: 2024-08-01
  Administered 2024-08-01 (×2): 4 ug via INTRAVENOUS

## 2024-08-01 MED ORDER — EPHEDRINE 5 MG/ML INJ
INTRAVENOUS | Status: AC
  Filled 2024-08-01: qty 5

## 2024-08-01 MED ORDER — FENTANYL CITRATE (PF) 100 MCG/2ML IJ SOLN: 100.0000 ug | Freq: Once | INTRAMUSCULAR | Status: DC

## 2024-08-01 MED ORDER — DEXAMETHASONE SODIUM PHOSPHATE 10 MG/ML IJ SOLN
INTRAMUSCULAR | Status: AC
  Filled 2024-08-01: qty 1

## 2024-08-01 MED ORDER — FENTANYL CITRATE (PF) 100 MCG/2ML IJ SOLN
INTRAMUSCULAR | Status: DC | PRN
  Administered 2024-08-01: 25 ug via INTRAVENOUS

## 2024-08-01 MED ORDER — OXYCODONE HCL 5 MG PO TABS
10.0000 mg | ORAL_TABLET | Freq: Once | ORAL | Status: AC
  Administered 2024-08-01: 10 mg via ORAL

## 2024-08-01 MED ORDER — LACTATED RINGERS IV SOLN
INTRAVENOUS | Status: DC
Start: 2024-08-01 — End: 2024-08-01

## 2024-08-01 MED ORDER — PROPOFOL 10 MG/ML IV BOLUS
INTRAVENOUS | Status: AC
  Filled 2024-08-01: qty 20

## 2024-08-01 MED ORDER — BUPIVACAINE LIPOSOME 1.3 % IJ SUSP
INTRAMUSCULAR | Status: DC | PRN
  Administered 2024-08-01 (×2): 10 mL via PERINEURAL

## 2024-08-01 MED ORDER — MIDAZOLAM HCL 2 MG/2ML IJ SOLN
INTRAMUSCULAR | Status: AC
  Filled 2024-08-01: qty 2

## 2024-08-01 MED ORDER — CEFAZOLIN SODIUM-DEXTROSE 2-4 GM/100ML-% IV SOLN
2.0000 g | INTRAVENOUS | Status: AC
  Administered 2024-08-01: 2 g via INTRAVENOUS

## 2024-08-01 MED ORDER — FENTANYL CITRATE (PF) 100 MCG/2ML IJ SOLN
INTRAMUSCULAR | Status: AC
  Filled 2024-08-01: qty 2

## 2024-08-01 MED ORDER — DEXAMETHASONE SODIUM PHOSPHATE 4 MG/ML IJ SOLN
INTRAMUSCULAR | Status: DC | PRN
  Administered 2024-08-01: 4 mg via INTRAVENOUS

## 2024-08-01 SURGICAL SUPPLY — 47 items
BENZOIN TINCTURE PRP APPL 2/3 (GAUZE/BANDAGES/DRESSINGS) IMPLANT
BIT DRILL 2 FENESTRATED (MISCELLANEOUS) IMPLANT
BIT DRILL CANNULATED 2.3MM (DRILL) IMPLANT
BIT DRILL SOLID 2.0 X 110MM (DRILL) IMPLANT
BLADE OSC/SAGITTAL MD 5.5X18 (BLADE) IMPLANT
BLADE OSC/SAGITTAL MD 9X18.5 (BLADE) IMPLANT
BNDG COHESIVE 4X5 TAN STRL LF (GAUZE/BANDAGES/DRESSINGS) ×2 IMPLANT
BNDG ESMARCH 4X12 STRL LF (GAUZE/BANDAGES/DRESSINGS) IMPLANT
BNDG ESMARCH 6X12 STRL LF (GAUZE/BANDAGES/DRESSINGS) IMPLANT
BNDG GAUZE DERMACEA FLUFF 4 (GAUZE/BANDAGES/DRESSINGS) ×2 IMPLANT
BNDG STRETCH 4X75 STRL LF (GAUZE/BANDAGES/DRESSINGS) ×2 IMPLANT
BOOT WALKER MEDIUM (SOFTGOODS) IMPLANT
CANISTER SUCT 1200ML W/VALVE (MISCELLANEOUS) ×2 IMPLANT
COVER LIGHT HANDLE UNIVERSAL (MISCELLANEOUS) ×4 IMPLANT
CUP REAMER 21 (MISCELLANEOUS) IMPLANT
DRAPE FLUOR MINI C-ARM 54X84 (DRAPES) ×2 IMPLANT
DURAPREP 26ML APPLICATOR (WOUND CARE) ×2 IMPLANT
ELECTRODE REM PT RTRN 9FT ADLT (ELECTROSURGICAL) ×2 IMPLANT
GAUZE SPONGE 4X4 12PLY STRL (GAUZE/BANDAGES/DRESSINGS) ×2 IMPLANT
GAUZE XEROFORM 1X8 LF (GAUZE/BANDAGES/DRESSINGS) ×2 IMPLANT
GLOVE BIOGEL PI IND STRL 7.5 (GLOVE) ×2 IMPLANT
GLOVE SURG SS PI 7.0 STRL IVOR (GLOVE) ×2 IMPLANT
GOWN STRL REUS W/ TWL LRG LVL3 (GOWN DISPOSABLE) ×4 IMPLANT
GRAFT DMB PUTTY 1 OPTIUM FD (Bone Implant) IMPLANT
KIT TURNOVER KIT A (KITS) ×2 IMPLANT
KWIRE SMOOTH 1.6X150MM (WIRE) IMPLANT
KWIRE SNGL END 1.2X150 (MISCELLANEOUS) IMPLANT
NDL HYPO 21X1.5 SAFETY (NEEDLE) IMPLANT
NEEDLE HYPO 21X1.5 SAFETY (NEEDLE) ×2 IMPLANT
NS IRRIG 500ML POUR BTL (IV SOLUTION) ×2 IMPLANT
PACK EXTREMITY ARMC (MISCELLANEOUS) ×2 IMPLANT
PENCIL SMOKE EVACUATOR (MISCELLANEOUS) ×2 IMPLANT
PIN BALLS 3/8 F/.045 WIRE (MISCELLANEOUS) ×2 IMPLANT
PIN DRILL TRIM-IT 1.5X100 (Pin) IMPLANT
PLATE MTP RT SHRT 0D (Plate) IMPLANT
SCREW CANN HEAD 3.5X24 (Screw) IMPLANT
SCREW COUNTERSINK HEADED 3.5 (ORTHOPEDIC DISPOSABLE SUPPLIES) IMPLANT
SCREW LOCK PLATE R3 2.7X12 (Screw) IMPLANT
SCREW LOCK PLATE R3 2.7X13 (Screw) IMPLANT
SCREW LOCK PLATE R3 2.7X14 (Screw) IMPLANT
SCREW NON LOCK PLATE 2.7X16M (Screw) IMPLANT
STOCKINETTE IMPERVIOUS LG (DRAPES) ×2 IMPLANT
STRIP CLOSURE SKIN 1/2X4 (GAUZE/BANDAGES/DRESSINGS) IMPLANT
SUT VIC AB 3-0 SH 27X BRD (SUTURE) IMPLANT
SUT VIC AB 4-0 FS2 27 (SUTURE) IMPLANT
SUTURE ETHLN 4-0 FS2 18XMF BLK (SUTURE) IMPLANT
WIRE OLIVE SMOOTH 1.4MMX60MM (WIRE) IMPLANT

## 2024-08-01 NOTE — Anesthesia Procedure Notes (Signed)
 Anesthesia Regional Block: Popliteal block   Pre-Anesthetic Checklist: , timeout performed,  Correct Patient, Correct Site, Correct Laterality,  Correct Procedure, Correct Position, site marked,  Risks and benefits discussed,  Surgical consent,  Pre-op evaluation,  At surgeon's request and post-op pain management  Laterality: Lower and Right  Prep: chloraprep       Needles:  Injection technique: Single-shot  Needle Type: Echogenic Needle     Needle Length: 9cm  Needle Gauge: 21     Additional Needles:   Procedures:,,,, ultrasound used (permanent image in chart),,    Narrative:  Start time: 08/01/2024 11:03 AM End time: 08/01/2024 11:09 AM Injection made incrementally with aspirations every 5 mL.  Performed by: Personally  Anesthesiologist: Ola Donny BROCKS, MD  Additional Notes: Patient consented for risk and benefits of nerve block including but not limited to nerve damage, failed block, bleeding and infection.  Patient voiced understanding.  Functioning IV was confirmed and monitors were applied.  Timeout done prior to procedure and prior to any sedation being given to the patient.  Patient confirmed procedure site prior to any sedation given to the patient.  A 50mm 22ga Stimuplex needle was used. Sterile prep,hand hygiene and sterile gloves were used.  Minimal sedation used for procedure.  No paresthesia endorsed by patient during the procedure.  Negative aspiration and negative test dose prior to incremental administration of local anesthetic. The patient tolerated the procedure well with no immediate complications.

## 2024-08-01 NOTE — H&P (Signed)
 HISTORY AND PHYSICAL INTERVAL NOTE:  08/01/2024  11:54 AM  Kiara Perry  has presented today for surgery, with the diagnosis of Acquired hallux valgus of right foot M20.11 Hammertoe of right foot M20.41.  The various methods of treatment have been discussed with the patient.  No guarantees were given.  After consideration of risks, benefits and other options for treatment, the patient has consented to surgery.  I have reviewed the patients' chart and labs.    PROCEDURE: RIGHT 1ST MTPJ FUSION RIGHT 2ND HAMMER TOE REPAIR FLEXOR TENOTOMIES 3-5 RIGHT FOOT  A history and physical examination was performed in my office.  The patient was reexamined.  There have been no changes to this history and physical examination.  Prentice Lee, DPM

## 2024-08-01 NOTE — Anesthesia Procedure Notes (Signed)
 Anesthesia Regional Block: Adductor canal block   Pre-Anesthetic Checklist: , timeout performed,  Correct Patient, Correct Site, Correct Laterality,  Correct Procedure, Correct Position, site marked,  Risks and benefits discussed,  Surgical consent,  Pre-op evaluation,  At surgeon's request and post-op pain management  Laterality: Lower and Right  Prep: chloraprep       Needles:  Injection technique: Single-shot  Needle Type: Echogenic Needle     Needle Length: 9cm  Needle Gauge: 21     Additional Needles:   Procedures:,,,, ultrasound used (permanent image in chart),,    Narrative:  Start time: 08/01/2024 10:54 AM End time: 08/01/2024 11:02 AM Injection made incrementally with aspirations every 5 mL.  Performed by: Personally  Anesthesiologist: Ola Donny BROCKS, MD  Additional Notes: Patient consented for risk and benefits of nerve block including but not limited to nerve damage, failed block, bleeding and infection.  Patient voiced understanding.  Functioning IV was confirmed and monitors were applied.  Timeout done prior to procedure and prior to any sedation being given to the patient.  Patient confirmed procedure site prior to any sedation given to the patient.  A 50mm 22ga Stimuplex needle was used. Sterile prep,hand hygiene and sterile gloves were used.  Minimal sedation used for procedure.  No paresthesia endorsed by patient during the procedure.  Negative aspiration and negative test dose prior to incremental administration of local anesthetic. The patient tolerated the procedure well with no immediate complications.

## 2024-08-01 NOTE — Transfer of Care (Signed)
 Immediate Anesthesia Transfer of Care Note  Patient: Kiara Perry  Procedure(s) Performed: ROMAYNE LOOK (Right) CORRECTION, HAMMER TOE (Right: Second Toe) TENOTOMY (Right)  Patient Location: PACU  Anesthesia Type: No value filed.  Level of Consciousness: awake, alert  and patient cooperative  Airway and Oxygen Therapy: Patient Spontanous Breathing and Patient connected to supplemental oxygen  Post-op Assessment: Post-op Vital signs reviewed, Patient's Cardiovascular Status Stable, Respiratory Function Stable, Patent Airway and No signs of Nausea or vomiting  Post-op Vital Signs: Reviewed and stable  Complications: No notable events documented.

## 2024-08-01 NOTE — Anesthesia Procedure Notes (Signed)
 Procedure Name: LMA Insertion Date/Time: 08/01/2024 12:10 PM  Performed by: Myra Lawless, CRNAPre-anesthesia Checklist: Patient identified, Patient being monitored, Timeout performed, Emergency Drugs available and Suction available Patient Re-evaluated:Patient Re-evaluated prior to induction Oxygen Delivery Method: Circle system utilized Preoxygenation: Pre-oxygenation with 100% oxygen Induction Type: IV induction Ventilation: Mask ventilation without difficulty LMA: LMA inserted Tube type: Oral Tube size: 4.0 mm Number of attempts: 1 Placement Confirmation: positive ETCO2 and breath sounds checked- equal and bilateral Tube secured with: Tape Dental Injury: Teeth and Oropharynx as per pre-operative assessment

## 2024-08-01 NOTE — Op Note (Signed)
 PODIATRY / FOOT AND ANKLE SURGERY OPERATIVE REPORT    SURGEON: Prentice Lee, DPM  PRE-OPERATIVE DIAGNOSIS:  1.  Right hallux valgus 2.  Right hammertoe contractures digits 2 through 5  POST-OPERATIVE DIAGNOSIS: Same  PROCEDURE(S): Right first metatarsal phalange joint fusion Right second PIPJ toe arthrodesis with second metatarsal phalange joint capsular tendon balancing Right third, fourth, fifth toe percutaneous flexor tenotomy's  HEMOSTASIS: Right ankle tourniquet  ANESTHESIA: general  ESTIMATED BLOOD LOSS: 20 cc  PATHOLOGY/SPECIMEN(S): None  INDICATIONS:   Kiara Perry is a 89 y.o. female who presents with a painful bunion deformity the first metatarsal phalange joint with hammertoe contractures digits 2 through 5.  Treatment options were discussed with patient involving conservative attempts at improvement consisting of wider shoes, steroid injections, RICE therapy with use of NSAID/Tylenol  as needed, Voltaren gel, orthoses, bunion pads and spacers and's as well as silicone toe sleeves and crest pads.  Patient continued to have pain discomfort.  All treatment options were discussed with the patient both conservative and surgical attempts at correction include potential risks and complications at this time patient is elected for surgery consisting of right first metatarsal phalange joint fusion, hammertoe repairs digits 2 through 5.  No guarantees given.  Consent obtained prior to procedure today, patient agreeable.  DESCRIPTION: After obtaining full informed written consent, the patient was brought back to the operating room and placed supine upon the operating table.  The patient received IV antibiotics prior to induction.  After obtaining adequate anesthesia, the patient was prepped and draped in the standard fashion.  A popliteal and saphenous nerve block was performed by anesthesia preoperatively.  An Esmarch bandage was used to exsanguinate the right lower extremity and  pneumatic ankle tourniquet was inflated.  Attention was then directed to the dorsal medial aspect of the first metatarsal phalange joint medial to the tendon of the extensor hallucis longus.  The incision was deepened to the subcutaneous tissues utilizing sharp blunt dissection and care was taken to identify and retract all vital neurovascular structures no venous contributories were cauterized necessary.  An incision was made into the dorsal medial aspect of the capsule of first metatarsal phalangeal joint along the entirety of the incision line, the Periosteal Tissue Was Reflected Medially and Laterally Thereby Exposing the First Metatarsal Phalange Joint the Operative Site.  Appeared to Have Some Degenerative Changes Dorsally at the Joint As Well As Medially but Centrally Appeared to Have Fairly Normal Cartilage.  At this time the medial eminence was resected and passed off the operative site.  The small dorsal exostosis was also resected and passed off the operative site.  A lateral release was then performed releasing the collateral ligaments laterally as well as the suspensory ligament and conjoined tendon of the adductor hallucis through the same incision.  A pin was then placed in the center of the first metatarsal head.  Utilizing the Paragon 28 reamer the articular cartilage off the head of the first metatarsal was resected.  The cartilage off the base of the proximal phalanx was resected with a curette down to subchondral bone.  The surgical site was flushed with copious amounts normal sterile saline.  Fenestration was then performed with a 2.0 drill bit to both surfaces of the joint.  The joint was then prepared further by putting the mineralized bone matrix directly into the joint to fill any gaps.  At this time the toe was then held in a rectus position and temporary fixated with a K wire.  The toe position was checked under fluoroscopic guidance and clinically and appeared to sit well overall.  At  this time the guidewire for the 3.5 cannulated headed screw was then placed through the medial base of the proximal phalanx directed through the first metatarsal phalange joint and into the first metatarsal head laterally.  This was checked under fluoroscopic guidance and appeared to sit excellently.  Utilizing standard AO principles and techniques a 3.5 x 24 mm partially-threaded headed cannulated Paragon 28 screw was placed.  Excellent compression was noted at the operative site.  The guidewire was removed and passed off the operative site.  The plate was then placed with temporary fixation all of wires at the dorsal aspect of the joint.  This was checked under fluoroscopic guidance and clinically appeared to be sitting in a good position overall.  The distal screws were then filled that were all 2.7 in nature, all the temporary fixation was then removed and the proximal compression hole was then filled with a nonlocking 2.7 screw and further compression was obtained.  The remaining 2 proximal screw holes were then filled with 2.7 mm locking screws utilizing standard AO principles and techniques.  C-arm imaging was utilized to verify correction which appeared to be excellent as the first intermetatarsal base angle appeared to be improved, the sesamoid position appeared to be improved, the hallux abductus angle appeared to be within normal limits in good/rectus position.  The surgical site was flushed with copious amounts normal sterile saline.  The periosteal capsular structures reapproximated well coapted with 3-0 Vicryl, subcutaneous tissues were approximate well coapted 4-0 Vicryl.  The skin was then reapproximated well coapted with 4-0 nylon horizontal type stitching.  Attention was then directed to the right second toe and second metatarsal phalange joint where a linear longitudinal incision was made over these areas.  The incision was deepened through the subcutaneous tissues utilizing sharp blunt  dissection and care was taken to notify and retract all vital neurovascular structures no venous contributories were cauterized necessary.  At this time at the PIPJ of the right second toe an extensor tenotomy and capsulotomy was performed followed by release of the collateral ligaments.  At this time the head of the proximal phalanx was resected and passed off the operative site.  The articular cartilage from the base of the middle phalanx was also resected and passed off the operative site.  The surgical site was flushed with copious amounts normal sterile saline.  A bone tunnel was then made into the proximal phalanx and in the middle phalanx.  At this time a Arthrex treatment pin was then placed with the appropriate length and cut and then once placed to the proximal phalanx the middle phalanx was lifted and placed over the pin to hold the toe in a rectus position.  The toe/pin was slightly bent to put the toe in a more rectus position.  The extensor tendon was cut at this area due to the slight bulkiness of the tendon and was reapproximated well coapted with 3-0 Vicryl.  Attention was then directed back to the second metatarsal phalange joint where a dorsal capsulotomy was performed due to the slight dorsal contracture of the digit.  An extensor lengthening was also performed of the extensor digitorum longus tendon and this was reapproximated with 3-0 Vicryl.  The toe appeared to sit in rectus position overall.  The subcutaneous tissue was reapproximated well coapted with 4-0 Vicryl and the skin was then reapproximated well coapted with 4-0  nylon.  Attention was then directed to digits 3, 4, 5 for underneath the PIPJ a 21-gauge needle was introduced to these areas and the flexor tendons were released.  This appeared to put digits 3, 4, 5 in a more rectus position.  Benzoin and Steri-Strips were applied to hold digits 3, 4, 5 in a rectus position while healing.  Benzoin and Steri-Strips was also applied to the  second toe to slightly plantarflexed second digit to hold it in position.  The pneumatic ankle tourniquet was deflated and a prompt hyperemic response was noted all digits of the right foot.  A postoperative dressing was then applied consisting of Xeroform followed by 4 x 4 gauze, gauze roll, cast padding, Ace wrap, tall cam boot.  The patient tolerated the procedure and anesthesia well and was transferred to the recovery and will follow signs stable and vascular status intact all toes of the right foot.  Following a period of postoperative monitoring the patient be discharged home with the appropriate orders, instructions, and medications.  COMPLICATIONS: None  CONDITION: Good, stable  Prentice Lee, DPM

## 2024-08-01 NOTE — Anesthesia Postprocedure Evaluation (Signed)
 Anesthesia Post Note  Patient: EARLENA WERST  Procedure(s) Performed: ROMAYNE LOOK (Right) CORRECTION, HAMMER TOE (Right: Second Toe) TENOTOMY (Right)  Patient location during evaluation: PACU Anesthesia Type: General Level of consciousness: awake and alert Pain management: pain level controlled Vital Signs Assessment: post-procedure vital signs reviewed and stable Respiratory status: spontaneous breathing, nonlabored ventilation, respiratory function stable and patient connected to nasal cannula oxygen Cardiovascular status: blood pressure returned to baseline and stable Postop Assessment: no apparent nausea or vomiting Anesthetic complications: no   No notable events documented.   Last Vitals:  Vitals:   08/01/24 1445 08/01/24 1450  BP:  136/60  Pulse:    Resp:    Temp: (!) 36.4 C 36.7 C  SpO2:      Last Pain:  Vitals:   08/01/24 1450  TempSrc:   PainSc: 0-No pain                 Tonika Eden C Bellamie Turney

## 2024-08-02 ENCOUNTER — Encounter: Payer: Self-pay | Admitting: Podiatry

## 2024-08-07 DIAGNOSIS — M2011 Hallux valgus (acquired), right foot: Secondary | ICD-10-CM | POA: Diagnosis not present

## 2024-08-07 DIAGNOSIS — M2041 Other hammer toe(s) (acquired), right foot: Secondary | ICD-10-CM | POA: Diagnosis not present

## 2024-08-07 DIAGNOSIS — M79671 Pain in right foot: Secondary | ICD-10-CM | POA: Diagnosis not present

## 2024-08-07 DIAGNOSIS — M2012 Hallux valgus (acquired), left foot: Secondary | ICD-10-CM | POA: Diagnosis not present

## 2024-08-26 DIAGNOSIS — Z79899 Other long term (current) drug therapy: Secondary | ICD-10-CM | POA: Diagnosis not present

## 2024-08-27 DIAGNOSIS — R399 Unspecified symptoms and signs involving the genitourinary system: Secondary | ICD-10-CM | POA: Diagnosis not present

## 2024-08-28 DIAGNOSIS — M79671 Pain in right foot: Secondary | ICD-10-CM | POA: Diagnosis not present

## 2024-08-28 DIAGNOSIS — M2041 Other hammer toe(s) (acquired), right foot: Secondary | ICD-10-CM | POA: Diagnosis not present

## 2024-08-28 DIAGNOSIS — M2011 Hallux valgus (acquired), right foot: Secondary | ICD-10-CM | POA: Diagnosis not present

## 2024-08-30 DIAGNOSIS — N390 Urinary tract infection, site not specified: Secondary | ICD-10-CM | POA: Diagnosis not present

## 2024-08-30 DIAGNOSIS — R531 Weakness: Secondary | ICD-10-CM | POA: Diagnosis not present

## 2024-08-30 DIAGNOSIS — R5383 Other fatigue: Secondary | ICD-10-CM | POA: Diagnosis not present

## 2024-09-02 DIAGNOSIS — N390 Urinary tract infection, site not specified: Secondary | ICD-10-CM | POA: Diagnosis not present

## 2024-09-02 DIAGNOSIS — N1832 Chronic kidney disease, stage 3b: Secondary | ICD-10-CM | POA: Diagnosis not present

## 2024-09-02 DIAGNOSIS — N1831 Chronic kidney disease, stage 3a: Secondary | ICD-10-CM | POA: Diagnosis not present

## 2024-09-02 DIAGNOSIS — E871 Hypo-osmolality and hyponatremia: Secondary | ICD-10-CM | POA: Diagnosis not present

## 2024-09-02 DIAGNOSIS — I10 Essential (primary) hypertension: Secondary | ICD-10-CM | POA: Diagnosis not present

## 2024-09-11 DIAGNOSIS — M2011 Hallux valgus (acquired), right foot: Secondary | ICD-10-CM | POA: Diagnosis not present

## 2024-09-11 DIAGNOSIS — M2041 Other hammer toe(s) (acquired), right foot: Secondary | ICD-10-CM | POA: Diagnosis not present

## 2024-09-11 DIAGNOSIS — M79671 Pain in right foot: Secondary | ICD-10-CM | POA: Diagnosis not present

## 2024-09-16 DIAGNOSIS — Z79899 Other long term (current) drug therapy: Secondary | ICD-10-CM | POA: Diagnosis not present

## 2024-09-23 DIAGNOSIS — M2012 Hallux valgus (acquired), left foot: Secondary | ICD-10-CM | POA: Diagnosis not present

## 2024-09-23 DIAGNOSIS — M2041 Other hammer toe(s) (acquired), right foot: Secondary | ICD-10-CM | POA: Diagnosis not present

## 2024-09-23 DIAGNOSIS — M79671 Pain in right foot: Secondary | ICD-10-CM | POA: Diagnosis not present

## 2024-09-23 DIAGNOSIS — M2011 Hallux valgus (acquired), right foot: Secondary | ICD-10-CM | POA: Diagnosis not present

## 2024-09-23 DIAGNOSIS — G629 Polyneuropathy, unspecified: Secondary | ICD-10-CM | POA: Diagnosis not present

## 2024-09-23 DIAGNOSIS — I890 Lymphedema, not elsewhere classified: Secondary | ICD-10-CM | POA: Diagnosis not present

## 2024-09-23 DIAGNOSIS — Z09 Encounter for follow-up examination after completed treatment for conditions other than malignant neoplasm: Secondary | ICD-10-CM | POA: Diagnosis not present

## 2024-10-11 ENCOUNTER — Encounter (INDEPENDENT_AMBULATORY_CARE_PROVIDER_SITE_OTHER): Payer: Self-pay | Admitting: Nurse Practitioner

## 2024-10-11 ENCOUNTER — Ambulatory Visit (INDEPENDENT_AMBULATORY_CARE_PROVIDER_SITE_OTHER): Admitting: Nurse Practitioner

## 2024-10-11 VITALS — BP 145/60 | HR 67 | Resp 17 | Ht 65.0 in | Wt 179.8 lb

## 2024-10-11 DIAGNOSIS — I83023 Varicose veins of left lower extremity with ulcer of ankle: Secondary | ICD-10-CM

## 2024-10-11 DIAGNOSIS — I890 Lymphedema, not elsewhere classified: Secondary | ICD-10-CM

## 2024-10-11 DIAGNOSIS — L97329 Non-pressure chronic ulcer of left ankle with unspecified severity: Secondary | ICD-10-CM

## 2024-10-11 DIAGNOSIS — I10 Essential (primary) hypertension: Secondary | ICD-10-CM | POA: Diagnosis not present

## 2024-10-14 ENCOUNTER — Encounter (INDEPENDENT_AMBULATORY_CARE_PROVIDER_SITE_OTHER): Payer: Self-pay | Admitting: Nurse Practitioner

## 2024-10-14 MED ORDER — FUROSEMIDE 20 MG PO TABS: 20.0000 mg | ORAL_TABLET | Freq: Every day | ORAL | 0 refills | Status: AC

## 2024-10-14 NOTE — Progress Notes (Signed)
 "  Subjective:    Patient ID: Kiara Perry, female    DOB: 29-Nov-1934, 89 y.o.   MRN: [id number] Chief Complaint  Patient presents with   New Patient (Initial Visit)    consult see GS/FB/BP. Lymphedema  ref: Lennie Barter     HPI  Discussed the use of AI scribe software for clinical note transcription with the patient, who gave verbal consent to proceed.  History of Present Illness Kiara Perry is an 89 year old female with lymphedema who presents with worsening bilateral lower extremity edema and persistent serous drainage.  She has longstanding bilateral lower extremity lymphedema, previously managed with daily use of compression stockings and a compression sleeve from foot to mid-thigh. She now requires assistance donning these garments due to increased swelling and tightness. She recently obtained a lift chair to facilitate leg elevation and mobility.  Over the past six weeks or longer, she has experienced progressive worsening of lower extremity edema, which began prior to recent foot surgery. The edema now extends above the knees into the thighs and hips, resulting in increased tightness, stiffness, and reduced mobility. She describes a sensation of heaviness and discomfort, with her clothing fitting more tightly. She states her legs have never been this severely affected before.  She has persistent serous drainage from both legs, with episodes of significant fluid leakage, including fluid visibly running off the exam table during podiatry visits. Approximately four weeks ago, zinc oxide wraps were applied with good effect. More recently, a tan stretchy gauze wrap without zinc caused severe pain and tightness, requiring removal. Since then, she continues to have ongoing fluid leakage. She describes episodes of stabbing pain in her legs, particularly after compression wraps, as well as areas of erythema with a white streak at the knee and open areas requiring dressing and  wrapping.  She has not previously used diuretics. She recently completed a course of cefdinir and is currently taking amlodipine . She reports increased nocturia, up to five times nightly. She spends significant time with her legs elevated and avoids prolonged standing or ambulation. She denies new or worsening symptoms outside of those related to her lower extremity edema and fluid leakage.    Results     Review of Systems  Cardiovascular:  Positive for leg swelling.  Skin:  Positive for wound.  All other systems reviewed and are negative.      Objective:   Physical Exam Vitals reviewed.  HENT:     Head: Normocephalic.  Cardiovascular:     Rate and Rhythm: Normal rate.  Pulmonary:     Effort: Pulmonary effort is normal.  Musculoskeletal:     Right lower leg: Edema present.     Left lower leg: Edema present.  Skin:    General: Skin is warm and moist.  Neurological:     Mental Status: She is alert and oriented to person, place, and time.  Psychiatric:        Mood and Affect: Mood normal.        Behavior: Behavior normal.        Thought Content: Thought content normal.        Judgment: Judgment normal.     Physical Exam EXTREMITIES: Swelling in legs extending to hips and thighs.  BP (!) 145/60   Pulse 67   Resp 17   Ht 5' 5 (1.651 m)   Wt 179 lb 12.8 oz (81.6 kg)   BMI 29.92 kg/m   Past Medical History:  Diagnosis Date  Anginal pain    Aortic atherosclerosis    Arthritis    Coronary artery disease    COVID-19 09/2019   GERD (gastroesophageal reflux disease)    Heart murmur    History of pulmonary embolus (PE)    Hyperlipidemia    Hypertension    Mild mitral regurgitation by prior echocardiogram    Mild tricuspid regurgitation by prior echocardiogram    Pneumonia    Stage 3a chronic kidney disease (CKD) (HCC)    Wears dentures    full upper   Wears hearing aid in both ears     Social History   Socioeconomic History   Marital status: Married     Spouse name: Not on file   Number of children: Not on file   Years of education: Not on file   Highest education level: Not on file  Occupational History   Not on file  Tobacco Use   Smoking status: Never   Smokeless tobacco: Never  Vaping Use   Vaping status: Never Used  Substance and Sexual Activity   Alcohol use: Never   Drug use: Never   Sexual activity: Not on file  Other Topics Concern   Not on file  Social History Narrative   Adult son in the home, lives a stroke    Social Drivers of Health   Tobacco Use: Low Risk (10/14/2024)   Patient History    Smoking Tobacco Use: Never    Smokeless Tobacco Use: Never    Passive Exposure: Not on file  Financial Resource Strain: Low Risk  (06/05/2024)   Received from [facility]   Overall Financial Resource Strain (CARDIA)    Difficulty of Paying Living Expenses: Not hard at all  Food Insecurity: No Food Insecurity (06/05/2024)   Received from [facility]   Epic    Within the past 12 months, you worried that your food would run out before you got the money to buy more.: Never true    Within the past 12 months, the food you bought just didn't last and you didn't have money to get more.: Never true  Transportation Needs: No Transportation Needs (06/05/2024)   Received from [facility] - Transportation    In the past 12 months, has lack of transportation kept you from medical appointments or from getting medications?: No    Lack of Transportation (Non-Medical): No  Physical Activity: Not on file  Stress: Not on file  Social Connections: Not on file  Intimate Partner Violence: Not At Risk (08/13/2022)   Humiliation, Afraid, Rape, and Kick questionnaire    Fear of Current or Ex-Partner: No    Emotionally Abused: No    Physically Abused: No    Sexually Abused: No  Depression (PHQ2-9): Not on file  Alcohol Screen: Not on file  Housing: Low Risk  (08/28/2024)    Received from [facility]   Epic    In the last 12 months, was there a time when you were not able to pay the mortgage or rent on time?: No    In the past 12 months, how many times have you moved where you were living?: 0    At any time in the past 12 months, were you homeless or living in a shelter (including now)?: No  Utilities: Not At Risk (06/05/2024)   Received from [facility]   Epic    In the past 12 months has the  electric, gas, oil, or water company threatened to shut off services in your home?: No  Health Literacy: Not on file    Past Surgical History:  Procedure Laterality Date   ABDOMINAL HYSTERECTOMY     APPENDECTOMY     BACK SURGERY     BREAST BIOPSY Left 1973   neg   BREAST CYST ASPIRATION Left 1970   neg   COLONOSCOPY     ESOPHAGOGASTRODUODENOSCOPY     EYE SURGERY     cateract   HALLUX VALGUS LAPIDUS Right 08/01/2024   Procedure: ROMAYNE LOOK;  Surgeon: Lennie Barter, DPM;  Location: [facility];  Service: Orthopedics/Podiatry;  Laterality: Right;   HAMMER TOE SURGERY Right 08/01/2024   Procedure: CORRECTION, HAMMER TOE;  Surgeon: Lennie Barter, DPM;  Location: [facility];  Service: Orthopedics/Podiatry;  Laterality: Right;   IVC FILTER INSERTION N/A 10/13/2020   Procedure: IVC FILTER INSERTION;  Surgeon: Jama Cordella MATSU, MD;  Location: [facility] INVASIVE CV LAB;  Service: Cardiovascular;  Laterality: N/A;   IVC FILTER REMOVAL N/A 03/30/2021   Procedure: IVC FILTER REMOVAL;  Surgeon: Jama Cordella MATSU, MD;  Location: [facility] INVASIVE CV LAB;  Service: Cardiovascular;  Laterality: N/A;   KNEE ARTHROPLASTY Left 01/11/2021   Procedure: COMPUTER ASSISTED TOTAL KNEE ARTHROPLASTY - RNFA;  Surgeon: Mardee Lynwood SQUIBB, MD;  Location: [facility] ORS;  Service: Orthopedics;  Laterality: Left;   MENISCUS REPAIR     right knee   ORIF TOE FRACTURE Right 09/23/2021   Procedure: OPEN REDUCTION INTERNAL FIXATION (ORIF) METATARSAL (TOE)  FRACTURE - 5TH;  Surgeon: Lennie Barter, DPM;  Location: [facility];  Service: Podiatry;  Laterality: Right;  nesthesia: Choice - pre-op pop saph/block   TENOTOMY Right 08/01/2024   Procedure: TENOTOMY;  Surgeon: Lennie Barter, DPM;  Location: [facility];  Service: Orthopedics/Podiatry;  Laterality: Right;  3rd, 4th and 5th toes   TONSILLECTOMY      Family History  Problem Relation Age of Onset   Breast cancer Sister 57    Allergies[1]     Latest Ref Rng & Units 08/14/2022    4:10 AM 08/13/2022    4:13 AM 08/12/2022    9:08 AM  CBC  WBC 4.0 - 10.5 K/uL 4.3  3.4  11.0   Hemoglobin 12.0 - 15.0 g/dL 89.6  89.2  87.6   Hematocrit 36.0 - 46.0 % 31.5  32.0  37.7   Platelets 150 - 400 K/uL 178  177  230       CMP     Component Value Date/Time   NA 134 (L) 08/14/2022 0410   K 3.7 08/14/2022 0410   CL 103 08/14/2022 0410   CO2 24 08/14/2022 0410   GLUCOSE 85 08/14/2022 0410   BUN 12 08/14/2022 0410   CREATININE 0.83 08/14/2022 0410   CALCIUM  8.4 (L) 08/14/2022 0410   PROT 5.3 (L) 08/14/2022 0410   ALBUMIN 2.7 (L) 08/14/2022 0410   AST 46 (H) 08/14/2022 0410   ALT 54 (H) 08/14/2022 0410   ALKPHOS 88 08/14/2022 0410   BILITOT 0.7 08/14/2022 0410   GFRNONAA >60 08/14/2022 0410     No results found.     Assessment & Plan:   1. Lymphedema (Primary) Recommend:  No surgery or intervention at this point in time.   The Patient is CEAP [id number].  The patient has been wearing compression for more than 12 weeks with no or little benefit.  The patient has been exercising daily for more than 12 weeks.  The patient has been elevating and taking OTC pain medications for more than 12 weeks.  None of these have have eliminated the pain related to the lymphedema or the discomfort regarding excessive swelling and venous congestion.    I have reviewed my discussion with the patient regarding lymphedema and why it  causes symptoms.  Patient will continue wearing  graduated compression on a daily basis. The patient should put the compression on first thing in the morning and removing them in the evening. The patient should not sleep in the compression.   In addition, behavioral modification throughout the day will be continued.  This will include frequent elevation (such as in a recliner), use of over the counter pain medications as needed and exercise such as walking.  The systemic causes for chronic edema such as liver, kidney and cardiac etiologies do not appear to have significant changed over the past year.    The patient has chronic , severe lymphedema with hyperpigmentation of the skin and has done MLD, skin care, medication, diet, exercise, elevation and compression for 4 weeks with no improvement,  I am recommending a lymphedema pump.  The patient still has stage 3 lymphedema and therefore, I believe that a lymph pump is needed to improve the control of the patient's lymphedema and improve the quality of life.  Additionally, a lymph pump is warranted because it will reduce the risk of cellulitis and ulceration in the future.  Patient should follow-up in six months   2. Venous ulcer of ankle, left (HCC) Lymphedema of lower extremities Chronic, severe lymphedema with progression above knees, causing significant swelling, tightness, serous drainage, and impaired mobility. Recent foot surgery exacerbated condition. Persistent fluid leakage necessitates additional interventions beyond compression therapy. Risks include skin breakdown, infection, and discomfort. Lifelong management required. - Initiated low-dose diuretic (20 mg) for short course to facilitate fluid removal and assess response. - Ordered culture of draining fluid to evaluate for infection prior to further antibiotic therapy. - Applied zinc oxide wraps with Xeroform over open areas; instructed weekly wrap changes for four weeks.(Unna Boots) - Prescribed lymphedema pump therapy; initiated  referral for device fitting and training; instructed use twice daily or at least once daily for one hour with legs elevated. - Reinforced daily use of compression stockings and leg elevation, emphasizing compliance with new lift chair. - Provided education on chronic nature of lymphedema and necessity of ongoing management with compression and pump therapy. - Scheduled follow-up after four weeks of nurse visits for reassessment and further management.  3. Primary hypertension Continue antihypertensive medications as already ordered, these medications have been reviewed and there are no changes at this time.   Medications Ordered Prior to Encounter[2]  There are no Patient Instructions on file for this visit. No follow-ups on file.   Deadrian Toya E Lynnann Knudsen, NP      [1]  Allergies Allergen Reactions   Levofloxacin Nausea Only  [2]  Current Outpatient Medications on File Prior to Visit  Medication Sig Dispense Refill   acetaminophen  (TYLENOL ) 500 MG tablet Take 500-1,000 mg by mouth every 6 (six) hours as needed (for pain).     amLODipine  (NORVASC ) 5 MG tablet Take 1 tablet (5 mg total) by mouth daily. 30 tablet 11   aspirin  EC 81 MG tablet Take 1 tablet (81 mg total) by mouth 2 (two) times daily. Swallow whole. 60 tablet 0   celecoxib  (CELEBREX ) 200 MG capsule Take 200 mg by mouth daily.     gabapentin  (NEURONTIN ) 100  MG capsule Take 200 mg by mouth at bedtime.     lovastatin (MEVACOR) 40 MG tablet Take 80 mg by mouth at bedtime.     ondansetron  (ZOFRAN ) 4 MG tablet Take 1 tablet (4 mg total) by mouth every 8 (eight) hours as needed for nausea or vomiting. 20 tablet 0   pantoprazole  (PROTONIX ) 20 MG tablet Take 20 mg by mouth daily.     telmisartan (MICARDIS) 40 MG tablet Take 80 mg by mouth in the morning.     traMADol  (ULTRAM ) 50 MG tablet Take 50 mg by mouth every 6 (six) hours as needed for moderate pain. for pain     Calcium  Carbonate-Vitamin D  600-400 MG-UNIT tablet Take 1 tablet by  mouth in the morning and at bedtime. (Patient not taking: Reported on 07/29/2024)     omeprazole (PRILOSEC) 40 MG capsule Take 40 mg by mouth in the morning. (Patient not taking: Reported on 07/29/2024)     No current facility-administered medications on file prior to visit.   "

## 2024-10-15 ENCOUNTER — Telehealth (INDEPENDENT_AMBULATORY_CARE_PROVIDER_SITE_OTHER): Payer: Self-pay

## 2024-10-15 ENCOUNTER — Ambulatory Visit (INDEPENDENT_AMBULATORY_CARE_PROVIDER_SITE_OTHER): Admitting: Nurse Practitioner

## 2024-10-15 ENCOUNTER — Encounter (INDEPENDENT_AMBULATORY_CARE_PROVIDER_SITE_OTHER): Payer: Self-pay | Admitting: Nurse Practitioner

## 2024-10-15 VITALS — BP 147/75 | HR 77 | Resp 17

## 2024-10-15 DIAGNOSIS — L97329 Non-pressure chronic ulcer of left ankle with unspecified severity: Secondary | ICD-10-CM

## 2024-10-15 DIAGNOSIS — I83023 Varicose veins of left lower extremity with ulcer of ankle: Secondary | ICD-10-CM

## 2024-10-15 NOTE — Telephone Encounter (Signed)
 Patient called at this time in reference to her lasix  being called into the pharmacy at this time, I called the pharmacy and verified the rx has been filled at this time and patient picked up medication a little after 8 AM this morning.

## 2024-10-15 NOTE — Progress Notes (Signed)
 History of Present Illness  There is no documented history at this time  Assessments & Plan   There are no diagnoses linked to this encounter.    Additional instructions  Subjective:  Patient presents with venous ulcer of the Bilateral lower extremity.    Procedure:  3 layer unna wrap was placed Bilateral lower extremity.   Plan:   Follow up in one week.

## 2024-10-16 ENCOUNTER — Encounter (INDEPENDENT_AMBULATORY_CARE_PROVIDER_SITE_OTHER)

## 2024-10-20 ENCOUNTER — Encounter (INDEPENDENT_AMBULATORY_CARE_PROVIDER_SITE_OTHER): Payer: Self-pay | Admitting: Nurse Practitioner

## 2024-10-23 ENCOUNTER — Ambulatory Visit (INDEPENDENT_AMBULATORY_CARE_PROVIDER_SITE_OTHER): Admitting: Nurse Practitioner

## 2024-10-23 ENCOUNTER — Encounter (INDEPENDENT_AMBULATORY_CARE_PROVIDER_SITE_OTHER): Payer: Self-pay

## 2024-10-23 VITALS — BP 166/67 | HR 73 | Resp 18 | Ht 65.0 in | Wt 174.0 lb

## 2024-10-23 DIAGNOSIS — L97329 Non-pressure chronic ulcer of left ankle with unspecified severity: Secondary | ICD-10-CM

## 2024-10-23 DIAGNOSIS — I83023 Varicose veins of left lower extremity with ulcer of ankle: Secondary | ICD-10-CM

## 2024-10-23 NOTE — Progress Notes (Signed)
 History of Present Illness  There is no documented history at this time  Assessments & Plan   There are no diagnoses linked to this encounter.    Additional instructions  Subjective:  Patient presents with venous ulcer of the Bilateral lower extremity.    Procedure:  3 layer unna wrap was placed Bilateral lower extremity.   Plan:   Follow up in one week.

## 2024-10-27 ENCOUNTER — Encounter (INDEPENDENT_AMBULATORY_CARE_PROVIDER_SITE_OTHER): Payer: Self-pay | Admitting: Nurse Practitioner

## 2024-10-30 ENCOUNTER — Encounter (INDEPENDENT_AMBULATORY_CARE_PROVIDER_SITE_OTHER): Payer: Self-pay

## 2024-10-30 ENCOUNTER — Ambulatory Visit (INDEPENDENT_AMBULATORY_CARE_PROVIDER_SITE_OTHER): Admitting: Nurse Practitioner

## 2024-10-30 VITALS — BP 155/62 | HR 71 | Resp 18

## 2024-10-30 DIAGNOSIS — L97329 Non-pressure chronic ulcer of left ankle with unspecified severity: Secondary | ICD-10-CM | POA: Diagnosis not present

## 2024-10-30 DIAGNOSIS — I83023 Varicose veins of left lower extremity with ulcer of ankle: Secondary | ICD-10-CM | POA: Diagnosis not present

## 2024-11-03 ENCOUNTER — Encounter (INDEPENDENT_AMBULATORY_CARE_PROVIDER_SITE_OTHER): Payer: Self-pay | Admitting: Nurse Practitioner

## 2024-11-03 NOTE — Progress Notes (Signed)
 History of Present Illness  There is no documented history at this time  Assessments & Plan   There are no diagnoses linked to this encounter.    Additional instructions  Subjective:  Patient presents with venous ulcer of the Bilateral lower extremity.    Procedure:  3 layer unna wrap was placed Bilateral lower extremity.   Plan:   Follow up in one week.

## 2024-11-06 ENCOUNTER — Ambulatory Visit (INDEPENDENT_AMBULATORY_CARE_PROVIDER_SITE_OTHER): Admitting: Nurse Practitioner

## 2024-11-06 ENCOUNTER — Encounter (INDEPENDENT_AMBULATORY_CARE_PROVIDER_SITE_OTHER): Payer: Self-pay

## 2024-11-06 VITALS — BP 157/70 | HR 59 | Resp 18 | Ht 65.0 in | Wt 170.0 lb

## 2024-11-06 DIAGNOSIS — L97329 Non-pressure chronic ulcer of left ankle with unspecified severity: Secondary | ICD-10-CM

## 2024-11-06 DIAGNOSIS — I83023 Varicose veins of left lower extremity with ulcer of ankle: Secondary | ICD-10-CM | POA: Diagnosis not present

## 2024-11-06 NOTE — Progress Notes (Signed)
 History of Present Illness  There is no documented history at this time  Assessments & Plan   There are no diagnoses linked to this encounter.    Additional instructions  Subjective:  Patient presents with venous ulcer of the Bilateral lower extremity.    Procedure:  3 layer unna wrap was placed Bilateral lower extremity.   Zinc oxide bilateral with coban  Plan:   Follow up in one week.

## 2024-11-10 ENCOUNTER — Encounter (INDEPENDENT_AMBULATORY_CARE_PROVIDER_SITE_OTHER): Payer: Self-pay | Admitting: Nurse Practitioner

## 2024-11-10 NOTE — Progress Notes (Unsigned)
 "        MRN : [id number]  Kiara Perry is a 89 y.o. (07-08-1935) female who presents with chief complaint of legs hurt and swell.  History of Present Illness:   She has longstanding bilateral lower extremity lymphedema, previously managed with daily use of compression stockings and a compression sleeve from foot to mid-thigh. She now requires assistance donning these garments due to increased swelling and tightness. She recently obtained a lift chair to facilitate leg elevation and mobility.   Over the past six weeks or longer, she has experienced progressive worsening of lower extremity edema, which began prior to recent foot surgery. The edema now extends above the knees into the thighs and hips, resulting in increased tightness, stiffness, and reduced mobility. She describes a sensation of heaviness and discomfort, with her clothing fitting more tightly. She states her legs have never been this severely affected before.   She has persistent serous drainage from both legs, with episodes of significant fluid leakage, including fluid visibly running off the exam table during podiatry visits. Approximately four weeks ago, zinc oxide wraps were applied with good effect. More recently, a tan stretchy gauze wrap without zinc caused severe pain and tightness, requiring removal. Since then, she continues to have ongoing fluid leakage. She describes episodes of stabbing pain in her legs, particularly after compression wraps, as well as areas of erythema with a white streak at the knee and open areas requiring dressing and wrapping.   She has not previously used diuretics. She recently completed a course of cefdinir and is currently taking amlodipine . She reports increased nocturia, up to five times nightly. She spends significant time with her legs elevated and avoids prolonged standing or ambulation. She denies new or worsening symptoms outside of those related to her lower extremity edema and fluid  leakage.    Active Medications[1]  Past Medical History:  Diagnosis Date   Anginal pain    Aortic atherosclerosis    Arthritis    Coronary artery disease    COVID-19 09/2019   GERD (gastroesophageal reflux disease)    Heart murmur    History of pulmonary embolus (PE)    Hyperlipidemia    Hypertension    Mild mitral regurgitation by prior echocardiogram    Mild tricuspid regurgitation by prior echocardiogram    Pneumonia    Stage 3a chronic kidney disease (CKD) (HCC)    Wears dentures    full upper   Wears hearing aid in both ears     Past Surgical History:  Procedure Laterality Date   ABDOMINAL HYSTERECTOMY     APPENDECTOMY     BACK SURGERY     BREAST BIOPSY Left 1973   neg   BREAST CYST ASPIRATION Left 1970   neg   COLONOSCOPY     ESOPHAGOGASTRODUODENOSCOPY     EYE SURGERY     cateract   HALLUX VALGUS LAPIDUS Right 08/01/2024   Procedure: ROMAYNE LOOK;  Surgeon: Lennie Barter, DPM;  Location: [facility];  Service: Orthopedics/Podiatry;  Laterality: Right;   HAMMER TOE SURGERY Right 08/01/2024   Procedure: CORRECTION, HAMMER TOE;  Surgeon: Lennie Barter, DPM;  Location: [facility];  Service: Orthopedics/Podiatry;  Laterality: Right;   IVC FILTER INSERTION N/A 10/13/2020   Procedure: IVC FILTER INSERTION;  Surgeon: Jama Cordella MATSU, MD;  Location: [facility];  Service: Cardiovascular;  Laterality: N/A;   IVC FILTER REMOVAL N/A 03/30/2021   Procedure: IVC FILTER REMOVAL;  Surgeon: Jama Cordella MATSU,  MD;  Location: [facility];  Service: Cardiovascular;  Laterality: N/A;   KNEE ARTHROPLASTY Left 01/11/2021   Procedure: COMPUTER ASSISTED TOTAL KNEE ARTHROPLASTY - RNFA;  Surgeon: Mardee Lynwood SQUIBB, MD;  Location: [facility];  Service: Orthopedics;  Laterality: Left;   MENISCUS REPAIR     right knee   ORIF TOE FRACTURE Right 09/23/2021   Procedure: OPEN REDUCTION INTERNAL FIXATION (ORIF) METATARSAL (TOE) FRACTURE - 5TH;  Surgeon:  Lennie Barter, DPM;  Location: [facility];  Service: Podiatry;  Laterality: Right;  nesthesia: Choice - pre-op pop saph/block   TENOTOMY Right 08/01/2024   Procedure: TENOTOMY;  Surgeon: Lennie Barter, DPM;  Location: [facility];  Service: Orthopedics/Podiatry;  Laterality: Right;  3rd, 4th and 5th toes   TONSILLECTOMY      Social History Social History[2]  Family History Family History  Problem Relation Age of Onset   Breast cancer Sister 27    Allergies[3]   REVIEW OF SYSTEMS (Negative unless checked)  Constitutional: [] Weight loss  [] Fever  [] Chills Cardiac: [] Chest pain   [] Chest pressure   [] Palpitations   [] Shortness of breath when laying flat   [] Shortness of breath with exertion. Vascular:  [] Pain in legs with walking   [x] Pain in legs at rest  [] History of DVT   [] Phlebitis   [x] Swelling in legs   [] Varicose veins   [] Non-healing ulcers Pulmonary:   [] Uses home oxygen   [] Productive cough   [] Hemoptysis   [] Wheeze  [] COPD   [] Asthma Neurologic:  [] Dizziness   [] Seizures   [] History of stroke   [] History of TIA  [] Aphasia   [] Vissual changes   [] Weakness or numbness in arm   [] Weakness or numbness in leg Musculoskeletal:   [] Joint swelling   [] Joint pain   [] Low back pain Hematologic:  [] Easy bruising  [] Easy bleeding   [] Hypercoagulable state   [] Anemic Gastrointestinal:  [] Diarrhea   [] Vomiting  [] Gastroesophageal reflux/heartburn   [] Difficulty swallowing. Genitourinary:  [] Chronic kidney disease   [] Difficult urination  [] Frequent urination   [] Blood in urine Skin:  [] Rashes   [] Ulcers  Psychological:  [] History of anxiety   []  History of major depression.  Physical Examination  There were no vitals filed for this visit. There is no height or weight on file to calculate BMI. Gen: WD/WN, NAD Head: [place]/AT, No temporalis wasting.  Ear/Nose/Throat: Hearing grossly intact, nares w/o erythema or drainage, pinna without lesions Eyes: PER, EOMI, sclera  nonicteric.  Neck: Supple, no gross masses.  No JVD.  Pulmonary:  Good air movement, no audible wheezing, no use of accessory muscles.  Cardiac: RRR, precordium not hyperdynamic. Vascular:  scattered varicosities present bilaterally.  Moderate venous stasis changes to the legs bilaterally.  2+ soft pitting edema. CEAP [id number]   Vessel Right Left  Radial Palpable Palpable  Gastrointestinal: soft, non-distended. No guarding/no peritoneal signs.  Musculoskeletal: M/S 5/5 throughout.  No deformity.  Neurologic: CN 2-12 intact. Pain and light touch intact in extremities.  Symmetrical.  Speech is fluent. Motor exam as listed above. Psychiatric: Judgment intact, Mood & affect appropriate for pt's clinical situation. Dermatologic: Venous rashes no ulcers noted.  No changes consistent with cellulitis. Lymph : No lichenification or skin changes of chronic lymphedema.  CBC Lab Results  Component Value Date   WBC 4.3 08/14/2022   HGB 10.3 (L) 08/14/2022   HCT 31.5 (L) 08/14/2022   MCV 91.0 08/14/2022   PLT 178 08/14/2022    BMET    Component Value Date/Time  NA 134 (L) 08/14/2022 0410   K 3.7 08/14/2022 0410   CL 103 08/14/2022 0410   CO2 24 08/14/2022 0410   GLUCOSE 85 08/14/2022 0410   BUN 12 08/14/2022 0410   CREATININE 0.83 08/14/2022 0410   CALCIUM  8.4 (L) 08/14/2022 0410   GFRNONAA >60 08/14/2022 0410   CrCl cannot be calculated (Patient's most recent lab result is older than the maximum 21 days allowed.).  COAG Lab Results  Component Value Date   INR 0.9 01/05/2021    Radiology No results found.   Assessment/Plan There are no diagnoses linked to this encounter.   Cordella Shawl, MD  11/10/2024 1:40 PM      [1]  No outpatient medications have been marked as taking for the 11/11/24 encounter (Appointment) with Shawl, Cordella MATSU, MD.  [2]  Social History Tobacco Use   Smoking status: Never   Smokeless tobacco: Never  Vaping Use   Vaping status: Never  Used  Substance Use Topics   Alcohol use: Never   Drug use: Never  [3]  Allergies Allergen Reactions   Levofloxacin Nausea Only   "

## 2024-11-11 ENCOUNTER — Ambulatory Visit (INDEPENDENT_AMBULATORY_CARE_PROVIDER_SITE_OTHER): Admitting: Vascular Surgery

## 2024-11-11 ENCOUNTER — Encounter (INDEPENDENT_AMBULATORY_CARE_PROVIDER_SITE_OTHER): Payer: Self-pay | Admitting: Vascular Surgery

## 2024-11-11 VITALS — BP 151/69 | HR 60 | Resp 17 | Ht 65.0 in | Wt 169.8 lb

## 2024-11-11 DIAGNOSIS — E782 Mixed hyperlipidemia: Secondary | ICD-10-CM

## 2024-11-11 DIAGNOSIS — I10 Essential (primary) hypertension: Secondary | ICD-10-CM | POA: Diagnosis not present

## 2024-11-11 DIAGNOSIS — I83023 Varicose veins of left lower extremity with ulcer of ankle: Secondary | ICD-10-CM | POA: Diagnosis not present

## 2024-11-11 DIAGNOSIS — L97329 Non-pressure chronic ulcer of left ankle with unspecified severity: Secondary | ICD-10-CM

## 2024-11-11 DIAGNOSIS — I25118 Atherosclerotic heart disease of native coronary artery with other forms of angina pectoris: Secondary | ICD-10-CM | POA: Diagnosis not present

## 2024-11-11 DIAGNOSIS — I872 Venous insufficiency (chronic) (peripheral): Secondary | ICD-10-CM | POA: Diagnosis not present

## 2024-11-13 ENCOUNTER — Ambulatory Visit (INDEPENDENT_AMBULATORY_CARE_PROVIDER_SITE_OTHER): Admitting: Nurse Practitioner

## 2024-11-22 ENCOUNTER — Other Ambulatory Visit: Payer: Self-pay | Admitting: Podiatry

## 2024-11-22 DIAGNOSIS — M2021 Hallux rigidus, right foot: Secondary | ICD-10-CM

## 2024-11-22 DIAGNOSIS — M9689 Other intraoperative and postprocedural complications and disorders of the musculoskeletal system: Secondary | ICD-10-CM

## 2024-11-22 DIAGNOSIS — G629 Polyneuropathy, unspecified: Secondary | ICD-10-CM

## 2024-11-22 DIAGNOSIS — I890 Lymphedema, not elsewhere classified: Secondary | ICD-10-CM

## 2024-11-29 ENCOUNTER — Inpatient Hospital Stay: Admission: RE | Admit: 2024-11-29

## 2024-11-29 DIAGNOSIS — G629 Polyneuropathy, unspecified: Secondary | ICD-10-CM

## 2024-11-29 DIAGNOSIS — M9689 Other intraoperative and postprocedural complications and disorders of the musculoskeletal system: Secondary | ICD-10-CM

## 2024-11-29 DIAGNOSIS — M2021 Hallux rigidus, right foot: Secondary | ICD-10-CM

## 2024-11-29 DIAGNOSIS — I890 Lymphedema, not elsewhere classified: Secondary | ICD-10-CM

## 2025-05-12 ENCOUNTER — Ambulatory Visit (INDEPENDENT_AMBULATORY_CARE_PROVIDER_SITE_OTHER): Admitting: Vascular Surgery
